# Patient Record
Sex: Female | Born: 1935 | Race: White | Hispanic: No | Smoking: Never smoker
Health system: Southern US, Community
[De-identification: ages and names within clinical notes are randomized; demographics above are authoritative.]

## PROBLEM LIST (undated history)

## (undated) DIAGNOSIS — I10 Essential (primary) hypertension: Secondary | ICD-10-CM

## (undated) DIAGNOSIS — I341 Nonrheumatic mitral (valve) prolapse: Secondary | ICD-10-CM

## (undated) DIAGNOSIS — M1990 Unspecified osteoarthritis, unspecified site: Secondary | ICD-10-CM

## (undated) DIAGNOSIS — T8859XA Other complications of anesthesia, initial encounter: Secondary | ICD-10-CM

## (undated) DIAGNOSIS — M810 Age-related osteoporosis without current pathological fracture: Secondary | ICD-10-CM

## (undated) DIAGNOSIS — I509 Heart failure, unspecified: Secondary | ICD-10-CM

## (undated) DIAGNOSIS — H3530 Unspecified macular degeneration: Secondary | ICD-10-CM

## (undated) DIAGNOSIS — T4145XA Adverse effect of unspecified anesthetic, initial encounter: Secondary | ICD-10-CM

## (undated) DIAGNOSIS — I2510 Atherosclerotic heart disease of native coronary artery without angina pectoris: Secondary | ICD-10-CM

## (undated) DIAGNOSIS — E785 Hyperlipidemia, unspecified: Secondary | ICD-10-CM

## (undated) DIAGNOSIS — R002 Palpitations: Secondary | ICD-10-CM

## (undated) DIAGNOSIS — I4891 Unspecified atrial fibrillation: Secondary | ICD-10-CM

## (undated) DIAGNOSIS — E875 Hyperkalemia: Secondary | ICD-10-CM

## (undated) HISTORY — PX: CATARACT EXTRACTION: SUR2

## (undated) HISTORY — PX: OTHER SURGICAL HISTORY: SHX169

## (undated) HISTORY — DX: Atherosclerotic heart disease of native coronary artery without angina pectoris: I25.10

## (undated) HISTORY — DX: Hyperlipidemia, unspecified: E78.5

## (undated) HISTORY — PX: ABDOMINAL HYSTERECTOMY: SHX81

## (undated) HISTORY — DX: Unspecified macular degeneration: H35.30

## (undated) HISTORY — DX: Nonrheumatic mitral (valve) prolapse: I34.1

## (undated) HISTORY — DX: Age-related osteoporosis without current pathological fracture: M81.0

## (undated) HISTORY — DX: Unspecified osteoarthritis, unspecified site: M19.90

## (undated) HISTORY — DX: Essential (primary) hypertension: I10

## (undated) HISTORY — DX: Hyperkalemia: E87.5

## (undated) HISTORY — PX: COLONOSCOPY: SHX174

## (undated) HISTORY — DX: Palpitations: R00.2

## (undated) HISTORY — PX: BREAST EXCISIONAL BIOPSY: SUR124

## (undated) HISTORY — PX: CYSTECTOMY: SUR359

---

## 2001-07-08 ENCOUNTER — Ambulatory Visit (HOSPITAL_COMMUNITY): Admission: RE | Admit: 2001-07-08 | Discharge: 2001-07-08 | Payer: Self-pay | Admitting: Family Medicine

## 2001-07-08 ENCOUNTER — Encounter: Payer: Self-pay | Admitting: Family Medicine

## 2001-07-15 ENCOUNTER — Encounter: Payer: Self-pay | Admitting: Specialist

## 2001-07-15 ENCOUNTER — Encounter: Admission: RE | Admit: 2001-07-15 | Discharge: 2001-07-15 | Payer: Self-pay | Admitting: Specialist

## 2003-01-30 ENCOUNTER — Encounter (HOSPITAL_COMMUNITY): Admission: RE | Admit: 2003-01-30 | Discharge: 2003-03-01 | Payer: Self-pay | Admitting: *Deleted

## 2003-03-18 ENCOUNTER — Encounter: Payer: Self-pay | Admitting: Family Medicine

## 2003-03-18 ENCOUNTER — Encounter (HOSPITAL_COMMUNITY): Admission: RE | Admit: 2003-03-18 | Discharge: 2003-04-17 | Payer: Self-pay | Admitting: Family Medicine

## 2003-04-27 ENCOUNTER — Encounter: Admission: RE | Admit: 2003-04-27 | Discharge: 2003-04-27 | Payer: Self-pay | Admitting: Orthopedic Surgery

## 2003-04-27 ENCOUNTER — Encounter: Payer: Self-pay | Admitting: Orthopedic Surgery

## 2003-05-21 ENCOUNTER — Encounter: Payer: Self-pay | Admitting: Physical Medicine and Rehabilitation

## 2003-05-21 ENCOUNTER — Ambulatory Visit (HOSPITAL_COMMUNITY)
Admission: RE | Admit: 2003-05-21 | Discharge: 2003-05-21 | Payer: Self-pay | Admitting: Physical Medicine and Rehabilitation

## 2003-06-09 ENCOUNTER — Encounter (HOSPITAL_COMMUNITY)
Admission: RE | Admit: 2003-06-09 | Discharge: 2003-07-09 | Payer: Self-pay | Admitting: Physical Medicine and Rehabilitation

## 2004-03-30 ENCOUNTER — Ambulatory Visit (HOSPITAL_COMMUNITY): Admission: RE | Admit: 2004-03-30 | Discharge: 2004-03-30 | Payer: Self-pay | Admitting: Internal Medicine

## 2004-10-13 ENCOUNTER — Ambulatory Visit (HOSPITAL_COMMUNITY): Admission: RE | Admit: 2004-10-13 | Discharge: 2004-10-13 | Payer: Self-pay | Admitting: Family Medicine

## 2005-02-07 ENCOUNTER — Encounter: Admission: RE | Admit: 2005-02-07 | Discharge: 2005-02-07 | Payer: Self-pay | Admitting: Family Medicine

## 2006-03-15 ENCOUNTER — Ambulatory Visit (HOSPITAL_COMMUNITY): Admission: RE | Admit: 2006-03-15 | Discharge: 2006-03-15 | Payer: Self-pay | Admitting: Family Medicine

## 2006-08-29 ENCOUNTER — Ambulatory Visit (HOSPITAL_COMMUNITY): Admission: RE | Admit: 2006-08-29 | Discharge: 2006-08-29 | Payer: Self-pay | Admitting: Family Medicine

## 2007-02-22 ENCOUNTER — Ambulatory Visit (HOSPITAL_COMMUNITY): Admission: RE | Admit: 2007-02-22 | Discharge: 2007-02-22 | Payer: Self-pay | Admitting: Family Medicine

## 2008-05-21 ENCOUNTER — Encounter: Admission: RE | Admit: 2008-05-21 | Discharge: 2008-05-21 | Payer: Self-pay | Admitting: Family Medicine

## 2008-11-03 ENCOUNTER — Encounter: Payer: Self-pay | Admitting: Cardiology

## 2009-04-20 ENCOUNTER — Encounter: Payer: Self-pay | Admitting: Cardiology

## 2009-04-21 ENCOUNTER — Encounter: Payer: Self-pay | Admitting: Cardiology

## 2009-04-21 ENCOUNTER — Encounter: Admission: RE | Admit: 2009-04-21 | Discharge: 2009-04-21 | Payer: Self-pay | Admitting: Family Medicine

## 2009-04-22 ENCOUNTER — Encounter: Payer: Self-pay | Admitting: Cardiology

## 2009-04-22 ENCOUNTER — Ambulatory Visit (HOSPITAL_COMMUNITY): Admission: RE | Admit: 2009-04-22 | Discharge: 2009-04-22 | Payer: Self-pay | Admitting: Family Medicine

## 2009-04-27 ENCOUNTER — Encounter: Payer: Self-pay | Admitting: Cardiology

## 2009-04-27 ENCOUNTER — Encounter (INDEPENDENT_AMBULATORY_CARE_PROVIDER_SITE_OTHER): Payer: Self-pay | Admitting: *Deleted

## 2009-04-27 ENCOUNTER — Ambulatory Visit: Payer: Self-pay | Admitting: Cardiology

## 2009-04-27 DIAGNOSIS — R072 Precordial pain: Secondary | ICD-10-CM | POA: Insufficient documentation

## 2009-04-27 DIAGNOSIS — E875 Hyperkalemia: Secondary | ICD-10-CM | POA: Insufficient documentation

## 2009-04-27 DIAGNOSIS — E785 Hyperlipidemia, unspecified: Secondary | ICD-10-CM | POA: Insufficient documentation

## 2009-04-27 DIAGNOSIS — I10 Essential (primary) hypertension: Secondary | ICD-10-CM | POA: Insufficient documentation

## 2009-04-28 ENCOUNTER — Telehealth: Payer: Self-pay | Admitting: Cardiology

## 2009-04-28 ENCOUNTER — Ambulatory Visit (HOSPITAL_COMMUNITY): Admission: RE | Admit: 2009-04-28 | Discharge: 2009-04-28 | Payer: Self-pay | Admitting: Cardiology

## 2009-04-28 ENCOUNTER — Ambulatory Visit: Payer: Self-pay | Admitting: Cardiology

## 2009-05-01 ENCOUNTER — Encounter: Payer: Self-pay | Admitting: Cardiology

## 2009-05-18 ENCOUNTER — Encounter: Payer: Self-pay | Admitting: Cardiology

## 2009-05-29 DIAGNOSIS — Z8679 Personal history of other diseases of the circulatory system: Secondary | ICD-10-CM | POA: Insufficient documentation

## 2009-06-04 ENCOUNTER — Encounter: Admission: RE | Admit: 2009-06-04 | Discharge: 2009-06-04 | Payer: Self-pay | Admitting: Family Medicine

## 2009-06-29 ENCOUNTER — Ambulatory Visit: Payer: Self-pay | Admitting: Cardiology

## 2010-04-28 ENCOUNTER — Ambulatory Visit (HOSPITAL_COMMUNITY): Admission: RE | Admit: 2010-04-28 | Discharge: 2010-04-28 | Payer: Self-pay | Admitting: Family Medicine

## 2010-06-10 ENCOUNTER — Encounter: Admission: RE | Admit: 2010-06-10 | Discharge: 2010-06-10 | Payer: Self-pay | Admitting: Family Medicine

## 2010-10-02 ENCOUNTER — Encounter: Payer: Self-pay | Admitting: Family Medicine

## 2010-10-03 ENCOUNTER — Encounter: Payer: Self-pay | Admitting: Family Medicine

## 2010-10-09 LAB — CONVERTED CEMR LAB
BUN: 16 mg/dL (ref 6–23)
Chloride: 102 meq/L (ref 96–112)
Creatinine, Ser: 1 mg/dL (ref 0.4–1.2)
Potassium: 4.1 meq/L (ref 3.5–5.1)

## 2010-12-13 ENCOUNTER — Encounter: Payer: Self-pay | Admitting: Cardiology

## 2010-12-16 ENCOUNTER — Other Ambulatory Visit (HOSPITAL_COMMUNITY): Payer: Self-pay | Admitting: Family Medicine

## 2010-12-16 ENCOUNTER — Ambulatory Visit (HOSPITAL_COMMUNITY)
Admission: RE | Admit: 2010-12-16 | Discharge: 2010-12-16 | Disposition: A | Discharge status: Home / Self Care | Payer: Medicare Other | Source: Ambulatory Visit | Attending: Family Medicine | Admitting: Family Medicine

## 2010-12-16 DIAGNOSIS — M542 Cervicalgia: Secondary | ICD-10-CM

## 2010-12-16 DIAGNOSIS — M79609 Pain in unspecified limb: Secondary | ICD-10-CM | POA: Insufficient documentation

## 2010-12-17 LAB — BASIC METABOLIC PANEL
BUN: 11 mg/dL (ref 6–23)
Calcium: 9.4 mg/dL (ref 8.4–10.5)
Creatinine, Ser: 0.75 mg/dL (ref 0.4–1.2)
GFR calc Af Amer: >60 mL/min (ref >60)
Glucose, Bld: 116 mg/dL — ABNORMAL HIGH (ref 70–99)

## 2010-12-17 LAB — CBC
HCT: 38.2 % (ref 36.0–46.0)
MCV: 91.7 fL (ref 78.0–100.0)
RDW: 13.7 % (ref 11.5–15.5)
WBC: 5.3 10*3/uL (ref 4.0–10.5)

## 2010-12-17 LAB — PROTIME-INR: Prothrombin Time: 12.7 seconds (ref 11.6–15.2)

## 2010-12-20 ENCOUNTER — Encounter: Payer: Self-pay | Admitting: Cardiology

## 2010-12-22 ENCOUNTER — Encounter: Payer: Self-pay | Admitting: Cardiology

## 2010-12-28 ENCOUNTER — Encounter: Payer: Self-pay | Admitting: Cardiology

## 2010-12-28 ENCOUNTER — Ambulatory Visit (INDEPENDENT_AMBULATORY_CARE_PROVIDER_SITE_OTHER): Payer: Medicare Other | Admitting: Cardiology

## 2010-12-28 DIAGNOSIS — E785 Hyperlipidemia, unspecified: Secondary | ICD-10-CM

## 2010-12-28 DIAGNOSIS — M79603 Pain in arm, unspecified: Secondary | ICD-10-CM

## 2010-12-28 DIAGNOSIS — R079 Chest pain, unspecified: Secondary | ICD-10-CM

## 2010-12-28 DIAGNOSIS — M79609 Pain in unspecified limb: Secondary | ICD-10-CM

## 2010-12-28 DIAGNOSIS — I10 Essential (primary) hypertension: Secondary | ICD-10-CM

## 2010-12-28 NOTE — Assessment & Plan Note (Signed)
Pain in arms is exertional, but also positional.  Of note, it can occur with some alteration of neck motion.  Findings on cervical spine reflect severe DJD.  Also reasonably good cath two years ago by Dr. Juanda Chance.  Hence, likelihood of CAD as cause is fairly low, but need for caution.  Recommend adenosine myoview for assessment.  Would also consider referral to neurosurgery if the above is ok.  Discussed at length with patient and family.  They are in agreement, and also agree that cath is not best at this time.

## 2010-12-28 NOTE — Assessment & Plan Note (Signed)
Reasonable control managed by Dr. Gerda Diss.

## 2010-12-28 NOTE — Progress Notes (Signed)
HPI:  Julie Swanson is seen at the request of Dr. Lilyan Punt.  She gets pain in the arms when she walks.  She underwent cath by Dr. Juanda Chance a couple of years ago which did not demonstrate significant obstruction.  Of note, she also has significant cervical spine disease recently documented by xrays.  All of these reports are populated in [facility].  See it for details. Position, like standing over the sink in kitchen also precipitates findings as does walking.  Denies any chest pain, and all is arm discomfort.    Current Outpatient Prescriptions  Medication Sig Dispense Refill  . atorvastatin (LIPITOR) 10 MG tablet Take 10 mg by mouth daily.        . calcium-vitamin D (OSCAL WITH D) 500-200 MG-UNIT per tablet Take 1 tablet by mouth 2 (two) times daily.        . fish oil-omega-3 fatty acids 1000 MG capsule Take 2 g by mouth daily.        . hydrochlorothiazide 25 MG tablet Take 25 mg by mouth daily.        . Lutein 20 MG CAPS Take by mouth.        . nabumetone (RELAFEN) 500 MG tablet Take 1,000 mg by mouth daily.        Marland Kitchen omeprazole (PRILOSEC) 20 MG capsule Take 20 mg by mouth daily.        . propranolol (INDERAL) 10 MG tablet Take 10 mg by mouth 2 (two) times daily.        Marland Kitchen DISCONTD: Lutein 20 MG CAPS Take by mouth daily.       Marland Kitchen DISCONTD: Multiple Vitamins-Minerals (ICAPS) CAPS Take by mouth daily.          Allergies  Allergen Reactions  . Piroxicam   . Raloxifene     Past Medical History  Diagnosis Date  . CAD (coronary artery disease)   . MVP (mitral valve prolapse)   . Palpitations   . HTN (hypertension)   . HLD (hyperlipidemia)   . Hyperkalemia   . Chest pain     Past Surgical History  Procedure Date  . Hysterectomy - unknown type     Family History  Problem Relation Age of Onset  . Stroke    . Heart attack      History   Social History  . Marital Status: Widowed    Spouse Name: N/A    Number of Children: N/A  . Years of Education: N/A   Occupational History  . Not on  file.   Social History Main Topics  . Smoking status: Never Smoker   . Smokeless tobacco: Not on file  . Alcohol Use: No  . Drug Use: No  . Sexually Active: Not on file   Other Topics Concern  . Not on file   Social History Narrative  . No narrative on file    ROS: Please see the HPI.  All other systems reviewed and negative.  PHYSICAL EXAM:  BP 148/84  Pulse 68  Ht 5\' 4"  (1.626 m)  Wt 148 lb (67.132 kg)  BMI 25.40 kg/m2  General: Well developed, well nourished, in no acute distress. Head:  Normocephalic and atraumatic. Neck: no JVD Lungs: Clear to auscultation and percussion. Heart: Normal S1 and S2.  No murmur, rubs or gallops.  Abdomen:  Normal bowel sounds; soft; non tender; no organomegaly Pulses: Pulses normal in all 4 extremities. Extremities: No clubbing or cyanosis. No edema. Neurologic: Alert and oriented x 3.  EKG:  NSR.  T inversion in V4 and V5, with flattened in V6.  Non specific tracing.  Not changed from last tracing in [facility].  ASSESSMENT AND PLAN:

## 2010-12-28 NOTE — Assessment & Plan Note (Signed)
Last LDL about 100 but given findings and age probably acceptable.

## 2010-12-28 NOTE — Patient Instructions (Signed)
Your physician has requested that you have an adenosine myoview. For further information please visit [email or web address] Please follow instruction sheet, as given.  Your physician recommends that you schedule a follow-up appointment in: we will discuss results stress test at your daughter's appointment

## 2011-01-05 ENCOUNTER — Ambulatory Visit (HOSPITAL_COMMUNITY): Payer: Medicare Other | Attending: Cardiology | Admitting: Radiology

## 2011-01-05 VITALS — Ht 64.0 in | Wt 152.0 lb

## 2011-01-05 DIAGNOSIS — I059 Rheumatic mitral valve disease, unspecified: Secondary | ICD-10-CM | POA: Insufficient documentation

## 2011-01-05 DIAGNOSIS — R0602 Shortness of breath: Secondary | ICD-10-CM | POA: Insufficient documentation

## 2011-01-05 DIAGNOSIS — R0989 Other specified symptoms and signs involving the circulatory and respiratory systems: Secondary | ICD-10-CM

## 2011-01-05 MED ORDER — TECHNETIUM TC 99M TETROFOSMIN IV KIT
10.9000 | PACK | Freq: Once | INTRAVENOUS | Status: AC | PRN
  Administered 2011-01-05: 10.9 via INTRAVENOUS

## 2011-01-05 MED ORDER — REGADENOSON 0.4 MG/5ML IV SOLN
0.4000 mg | Freq: Once | INTRAVENOUS | Status: AC
  Administered 2011-01-05: 0.4 mg via INTRAVENOUS

## 2011-01-05 MED ORDER — TECHNETIUM TC 99M TETROFOSMIN IV KIT
33.0000 | PACK | Freq: Once | INTRAVENOUS | Status: AC | PRN
  Administered 2011-01-05: 33 via INTRAVENOUS

## 2011-01-05 NOTE — Progress Notes (Signed)
[facility] SITE 3 NUCLEAR MED [address] [place] [address] [phone]  Cardiology Nuclear Med Study  Julie Swanson is a 77 y.o. female [id number] [birth year]   Nuclear Med Background Indication for Stress Test:  Evaluation for Ischemia History:8/10  Heart Catheterization EF-70%. Minimal CAD, 2003 Myocardial Perfusion Study- Nml and MVP Cardiac Risk Factors: CVA, Family History - CAD, Hypertension and Lipids  Symptoms:  DOE, Palpitations, Rapid HR and Extertional Bilateral Arm Pain- last episode 2 days ago   Nuclear Pre-Procedure Caffeine/Decaff Intake:  None NPO After: 7:00pm   Lungs:  Clear IV 0.9% NS with Angio Cath:  20g  IV Site: R Antecubital  IV Started by:  Bonnita Levan, RN  Chest Size (in):  36 Cup Size: B  Height: 5\' 4"  (1.626 m)  Weight:  152 lb (68.947 kg)  BMI:  Body mass index is 26.09 kg/(m^2). Tech Comments:  Patient did not take Inderal    Nuclear Med Study 1 or 2 day study: 1 day  Stress Test Type:  Treadmill/Lexiscan  Reading MD: Kristeen Miss, MD  Order Authorizing Provider:  Dr. Shawnie Pons  Resting Radionuclide: Technetium 46m Tetrofosmin  Resting Radionuclide Dose: 10.9 mCi   Stress Radionuclide:  Technetium 79m Tetrofosmin  Stress Radionuclide Dose: 33.0 mCi           Stress Protocol Rest HR: 63 Stress HR: 111  Rest BP: 159/90 Stress BP: 168/75  Exercise Time (min): 2:00 METS: 1.6   Predicted Max HR: 139 bpm % Max HR: 79.86 bpm Rate Pressure Product: 26948   Dose of Adenosine (mg):  n/a Dose of Lexiscan: 0.4 mg  Dose of Atropine (mg): n/a Dose of Dobutamine: n/a mcg/kg/min (at max HR)  Stress Test Technologist: Bonnita Levan, RN  Nuclear Technologist:  Domenic Polite, CNMT     Rest Procedure:  Myocardial perfusion imaging was performed at rest 45 minutes following the intravenous administration of Technetium 39m Tetrofosmin. Rest ECG: NSR  Stress Procedure:  The patient received IV Lexiscan 0.4 mg over  15-seconds with concurrent low level exercise and then Technetium 86m Tetrofosmin was injected at 30-seconds while the patient continued walking one more minute. Patient c/o neck tightness with injection. There were non-specific ST-T changes with Lexiscan.  Quantitative spect images were obtained after a 45-minute delay. Stress ECG: No significant change from baseline ECG  QPS Raw Data Images:  Normal; no motion artifact; normal heart/lung ratio. Stress Images:  Normal homogeneous uptake in all areas of the myocardium. Rest Images:  Normal homogeneous uptake in all areas of the myocardium. Subtraction (SDS):  No evidence of ischemia. Transient Ischemic Dilatation (Normal <1.22):  1.18 Lung/Heart Ratio (Normal <0.45):  0.24  Quantitative Gated Spect Images QGS EDV:  43 ml QGS ESV:  8 ml QGS cine images:  NL LV Function; NL Wall Motion QGS EF: 81%  Impression Exercise Capacity:  Lexiscan with low level exercise. BP Response:  Normal blood pressure response. Clinical Symptoms:  No chest pain. ECG Impression:  No significant ST segment change suggestive of ischemia. Comparison with Prior Nuclear Study: No significant change from previous study  Overall Impression:  Normal stress nuclear study.  No evidence of ischemia.  Normal LV function.    Elyn Aquas.,MD

## 2011-01-12 ENCOUNTER — Ambulatory Visit: Payer: Self-pay | Admitting: Cardiology

## 2011-01-12 NOTE — Progress Notes (Signed)
ROUTED TO DR. Riley Kill.Mirna Mires

## 2011-01-12 NOTE — Progress Notes (Signed)
Study reviewed with the patient in the office.  No significant ischemia noted.  Continue to monitor.

## 2011-01-24 NOTE — Cardiovascular Report (Signed)
Julie Swanson, Julie Swanson                ACCOUNT NO.:  [id number]   MEDICAL RECORD NO.:  [id number]          PATIENT TYPE:  OIB   LOCATION:  2899                         FACILITY:  [facility]   PHYSICIAN:  Everardo Beals. Juanda Chance, MD, FACCDATE OF BIRTH:  1928-07-19   DATE OF PROCEDURE:  04/28/2009  DATE OF DISCHARGE:                            CARDIAC CATHETERIZATION   CLINICAL HISTORY:  Ms. Stitt is 77 year old and has a previous history  of minimally nonobstructive disease at catheterization in 1998 by Dr.  Riley Kill.  Recently, she was seen by Dr. Gerda Diss for evaluation of  dizziness and vertigo, and had an MRI which showed a small old stroke,  but no other major abnormalities.  She suffered and developed chest pain  with radiation to her neck and arms, and had 2 episodes which were  somewhat prolonged.  Dr. Gerda Diss saw her and her ECG showed some lateral  ST-T changes, and he referred to [place].  I saw her yesterday in  consultation.  We scheduled her for angiography today as an outpatient.   She does have significant risk factors for vascular disease with  hypertension, hyperlipidemia, and positive family history for coronary  artery disease.   PROCEDURE:  The procedure was performed by the right femoral artery  using arterial sheath and 5-French preformed coronary catheters.  A  front wall arterial puncture was performed, and Omnipaque contrast was  used.  Distal aortogram was performed to rule out renovascular causes  for hypertension.  The patient tolerated the procedure well and left the  laboratory in satisfactory condition.   RESULTS:  Left main coronary artery:  The left main coronary artery was  free of significant disease.   Left anterior descending artery:  Left anterior descending artery was  irregular and gave rise to a large diagonal branch and 2 septal  perforators.  The LAD became small in caliber near the apex and  terminated before the apex, but there was no significant  obstruction.   Circumflex artery:  Circumflex artery gave rise to 2 early marginal  branches and a large posterolateral branch.  These vessels were free of  significant disease.   Right coronary artery:  Right coronary artery was a large vessel gave  rise to conus branch, right ventricular branch, posterior descending  branch, and a posterolateral branch.  There was some irregularities in  the right coronary artery, but no significant obstruction.   Left ventriculogram:  The left ventriculogram performed in the RAO  projection showed vigorous wall motion with no areas of hypokinesis.  Estimated ejection fraction was 70%.   Distal aortogram:  A distal aortogram showed patent renal arteries.  The  right renal artery bifurcated in 2 branches and a superior branch had a  fibromuscular dysplasia, but this did not appear to be obstructive.  There were irregularities in the distal aorta, but no significant  aortoiliac obstruction.   CONCLUSION:  Minimal nonobstructive coronary artery disease with  irregularities in the left anterior descending and right coronary artery  and no significant obstruction of circumflex artery, and normal left  ventricular function.   RECOMMENDATIONS:  The patient has only minimal nonobstructive coronary  artery disease, so I think her recent symptoms were not related to her  heart.  She has known cervical spine disease and it is possible symptoms  could be related to this.  We will plan reassurance and plan to arrange  a follow up with Dr. Gerda Diss.      Bruce Elvera Lennox Juanda Chance, MD, [facility]  Electronically Signed     BRB/MEDQ  D:  04/28/2009  T:  04/28/2009  Job:  [id number]   cc:   Lorin Picket A. Gerda Diss, MD

## 2011-01-27 NOTE — Op Note (Signed)
NAME:  Julie Swanson, Julie Swanson                          ACCOUNT NO.:  [id number]   MEDICAL RECORD NO.:  [id number]                   PATIENT TYPE:  AMB   LOCATION:  DAY                                  FACILITY:  [facility]   PHYSICIAN:  Lionel December, M.D.                 DATE OF BIRTH:  1927/11/02   DATE OF PROCEDURE:  03/30/2004  DATE OF DISCHARGE:                                 OPERATIVE REPORT   PROCEDURE:  Total colonoscopy.   INDICATIONS FOR PROCEDURE:  Julie Swanson is a 77 year old Caucasian female who is  undergoing colonoscopy primarily for screening purposes.  She states that  she has never had normal stools since her last colonoscopy about seven or  eight years ago.  She passes hard fecal balls.  She is on Colace.  She  denies melena or rectal bleeding.  Family history is positive for colon  carcinoma in her brother in his 37s.  The procedure and risks were reviewed  with the patient, and informed consent was obtained.   PREOPERATIVE MEDICATIONS:  Demerol 25 mg IV, Versed 3 mg IV.   FINDINGS:  The procedure was performed in the endoscopy suite.  The  patient's vital signs and O2 saturations were monitored during the procedure  and remained stable.  The patient was placed in the left lateral recumbent  position and rectal examination performed.  She had soft sentinel skin tags.  Digital exam was normal.  The Olympus videoscope was placed into the rectum  and advanced into the region of the sigmoid colon and beyond.  The  preparation was excellent.  Three small diverticula were noted, two at the  sigmoid colon and one at the transverse colon.  The scope was advanced to  the cecum which was identified by the appendiceal orifice and ileocecal  valve.  Pictures were taken for the record.  As the scope was withdrawn, the  colonic mucosa was carefully examined and was normal throughout.  The rectal  mucosa similarly was normal.  The scope was retroflexed to examine the  anorectal junction, and she  had hemorrhoids above and below the dentate  line.  The endoscope was straightened and withdrawn.  The patient tolerated  the procedure well.   FINAL DIAGNOSIS:  Three small diverticula and internal/external hemorrhoids.  Otherwise normal colonoscopy.   RECOMMENDATIONS:  1. She will continue high fiber diet and Colace as before.  2. Citrucel one tablespoon full daily.  If this combination does not help     with bowel movements, she can take Lactulose one to two tablespoons full     q.h.s.  Prescription give for one quart with refills for up to a year.  3. She may consider her next screening exam in five years from now.      ___________________________________________  Lionel December, M.D.   NR/MEDQ  D:  03/30/2004  T:  03/30/2004  Job:  [id number]   cc:   Lorin Picket A. Gerda Diss, M.D.  [address]  [place]  [place] [address]  Fax: [phone]

## 2011-02-16 ENCOUNTER — Encounter (INDEPENDENT_AMBULATORY_CARE_PROVIDER_SITE_OTHER): Payer: Self-pay | Admitting: Surgery

## 2011-05-08 ENCOUNTER — Other Ambulatory Visit: Payer: Self-pay | Admitting: Family Medicine

## 2011-05-08 DIAGNOSIS — Z1231 Encounter for screening mammogram for malignant neoplasm of breast: Secondary | ICD-10-CM

## 2011-06-12 ENCOUNTER — Ambulatory Visit
Admission: RE | Admit: 2011-06-12 | Discharge: 2011-06-12 | Disposition: A | Discharge status: Home / Self Care | Payer: Medicare Other | Source: Ambulatory Visit | Attending: Family Medicine | Admitting: Family Medicine

## 2011-06-12 DIAGNOSIS — Z1231 Encounter for screening mammogram for malignant neoplasm of breast: Secondary | ICD-10-CM

## 2011-07-13 ENCOUNTER — Ambulatory Visit (INDEPENDENT_AMBULATORY_CARE_PROVIDER_SITE_OTHER): Payer: Self-pay | Admitting: Surgery

## 2011-07-25 ENCOUNTER — Encounter: Payer: Self-pay | Admitting: Cardiology

## 2011-08-18 ENCOUNTER — Encounter (INDEPENDENT_AMBULATORY_CARE_PROVIDER_SITE_OTHER): Payer: Self-pay | Admitting: Surgery

## 2011-08-21 ENCOUNTER — Encounter (INDEPENDENT_AMBULATORY_CARE_PROVIDER_SITE_OTHER): Payer: Self-pay | Admitting: Surgery

## 2011-08-21 ENCOUNTER — Encounter (INDEPENDENT_AMBULATORY_CARE_PROVIDER_SITE_OTHER): Payer: Medicare Other | Admitting: Surgery

## 2011-09-22 ENCOUNTER — Encounter (INDEPENDENT_AMBULATORY_CARE_PROVIDER_SITE_OTHER): Payer: Self-pay | Admitting: Surgery

## 2011-09-27 ENCOUNTER — Encounter (INDEPENDENT_AMBULATORY_CARE_PROVIDER_SITE_OTHER): Payer: Medicare Other | Admitting: Surgery

## 2011-11-27 NOTE — Progress Notes (Signed)
This encounter was created in error - please disregard.

## 2012-05-23 ENCOUNTER — Other Ambulatory Visit: Payer: Self-pay | Admitting: Family Medicine

## 2012-05-23 DIAGNOSIS — Z1231 Encounter for screening mammogram for malignant neoplasm of breast: Secondary | ICD-10-CM

## 2012-06-12 ENCOUNTER — Ambulatory Visit: Payer: Medicare Other

## 2012-06-17 ENCOUNTER — Ambulatory Visit (INDEPENDENT_AMBULATORY_CARE_PROVIDER_SITE_OTHER): Payer: Medicare Other | Admitting: Cardiology

## 2012-06-17 ENCOUNTER — Encounter: Payer: Self-pay | Admitting: Cardiology

## 2012-06-17 VITALS — BP 150/92 | HR 79 | Resp 18 | Ht 64.0 in | Wt 153.0 lb

## 2012-06-17 DIAGNOSIS — R002 Palpitations: Secondary | ICD-10-CM

## 2012-06-17 DIAGNOSIS — I2510 Atherosclerotic heart disease of native coronary artery without angina pectoris: Secondary | ICD-10-CM

## 2012-06-17 DIAGNOSIS — R072 Precordial pain: Secondary | ICD-10-CM

## 2012-06-17 NOTE — Patient Instructions (Addendum)
Your physician has requested that you have an exercise tolerance test. For further information please visit [email or web address] Please also follow instruction sheet, as given. WITH DR Riley Kill IN 2 WEEKS  Your physician has recommended that you wear an event monitor. Event monitors are medical devices that record the heart's electrical activity. Doctors most often [place] these monitors to diagnose arrhythmias. Arrhythmias are problems with the speed or rhythm of the heartbeat. The monitor is a small, portable device. You can wear one while you do your normal daily activities. This is usually used to diagnose what is causing palpitations/syncope (passing out).   Your physician recommends that you HAVE LAB WORK TODAY: TSH

## 2012-06-18 ENCOUNTER — Encounter (INDEPENDENT_AMBULATORY_CARE_PROVIDER_SITE_OTHER): Payer: Medicare Other

## 2012-06-18 DIAGNOSIS — R002 Palpitations: Secondary | ICD-10-CM

## 2012-06-23 NOTE — Progress Notes (Signed)
HPI:  The patient returns in followup. She recently has had several tick bites. She also had no energy. She had a rash, and was seen by her primary care physician and was started on some Valtrex.  She then developed a very fast heart rate, and this lasted for just over an hour. She had a little bit of discomfort, it radiated into both elbows.  She is here for followup. Noted below is her last major cardiac evaluation which time she underwent cardiac catheterization by Dr. Juanda Chance in 2010:   CONCLUSION: Minimal nonobstructive coronary artery disease with  irregularities in the left anterior descending and right coronary artery  and no significant obstruction of circumflex artery, and normal left  ventricular function.  RECOMMENDATIONS: The patient has only minimal nonobstructive coronary  artery disease, so I think her recent symptoms were not related to her  heart. She has known cervical spine disease and it is possible symptoms  could be related to this. We will plan reassurance and plan to arrange  a follow up with Dr. Gerda Diss.  Bruce Elvera Lennox Juanda Chance, MD, [facility]  Electronically Signed  BRB/MEDQ D: 04/28/2009 T: 04/28/2009 Job: [id number]  Current Outpatient Prescriptions  Medication Sig Dispense Refill  . atorvastatin (LIPITOR) 10 MG tablet Take 10 mg by mouth daily.        . calcium-vitamin D (OSCAL WITH D) 500-200 MG-UNIT per tablet Take 1 tablet by mouth 2 (two) times daily.        . fish oil-omega-3 fatty acids 1000 MG capsule Take 2 g by mouth daily.        . hydrochlorothiazide 25 MG tablet Take 25 mg by mouth daily.        . Lutein 20 MG CAPS Take by mouth.        . Multiple Vitamins-Minerals (ICAPS) CAPS Take 2 capsules by mouth daily.      . nabumetone (RELAFEN) 500 MG tablet Take 1,000 mg by mouth daily.        . propranolol (INDERAL) 10 MG tablet Take 10 mg by mouth 2 (two) times daily.        . RESTASIS 0.05 % ophthalmic emulsion       . omeprazole (PRILOSEC) 20 MG capsule Take 20 mg  by mouth daily.          Allergies  Allergen Reactions  . Valtrex (Valacyclovir Hcl) Palpitations    Chest pain.  Marland Kitchen Antihistamines, Chlorpheniramine-Type   . Evista (Raloxifene)   . Feldene (Piroxicam)   . Toprol Xl (Metoprolol Tartrate)     Bad dreams  . Tropicamide     Dizziness, lethargic, non-responsive    Past Medical History  Diagnosis Date  . CAD (coronary artery disease)   . MVP (mitral valve prolapse)   . Palpitations   . HTN (hypertension)   . HLD (hyperlipidemia)   . Hyperkalemia   . Chest pain     Past Surgical History  Procedure Date  . Hysterectomy - unknown type   . Abdominal hysterectomy   . Cystectomy     BREAST  . Cataract extraction     BOTH EYES    Family History  Problem Relation Age of Onset  . Stroke    . Heart attack    . Heart disease Mother   . Stroke Mother   . Heart disease Father   . Cancer Father   . Cancer Brother   . Heart disease Brother     History   Social History  .  Marital Status: Widowed    Spouse Name: N/A    Number of Children: N/A  . Years of Education: N/A   Occupational History  . Not on file.   Social History Main Topics  . Smoking status: Never Smoker   . Smokeless tobacco: Never Used  . Alcohol Use: No  . Drug Use: No  . Sexually Active: Not on file   Other Topics Concern  . Not on file   Social History Narrative  . No narrative on file    ROS: Please see the HPI.  All other systems reviewed and negative.  PHYSICAL EXAM:  BP 150/92  Pulse 79  Resp 18  Ht 5\' 4"  (1.626 m)  Wt 153 lb (69.4 kg)  BMI 26.26 kg/m2  SpO2 92%  General: Well developed, well nourished, in no acute distress. Head:  Normocephalic and atraumatic. Neck: no JVD Lungs: Clear to auscultation and percussion. Heart: Normal S1 and S2.  No murmur, rubs or gallops.  Pulses: Pulses normal in all 4 extremities. Extremities: No clubbing or cyanosis. No edema. Neurologic: Alert and oriented x 3.  EKG:  NSR.  Possible  LAE.  IRBBB.  Nonspecific T abnormality with T inversion V3-V6.  No real change from 2012.  ASSESSMENT AND PLAN:  Duration of office visit more than 25 minutes.

## 2012-07-02 ENCOUNTER — Ambulatory Visit: Payer: Medicare Other

## 2012-07-02 ENCOUNTER — Ambulatory Visit (INDEPENDENT_AMBULATORY_CARE_PROVIDER_SITE_OTHER): Payer: Medicare Other | Admitting: Cardiology

## 2012-07-02 DIAGNOSIS — R002 Palpitations: Secondary | ICD-10-CM

## 2012-07-02 DIAGNOSIS — Z1231 Encounter for screening mammogram for malignant neoplasm of breast: Secondary | ICD-10-CM

## 2012-07-02 MED ORDER — PROPRANOLOL HCL 10 MG PO TABS: 10.0000 mg | ORAL_TABLET | Freq: Three times a day (TID) | ORAL | Status: DC

## 2012-07-02 NOTE — Procedures (Signed)
Exercise Treadmill Test  Pre-Exercise Testing Evaluation     Rate: 64   QRS:  .16  QTc: .42       Test  Exercise Tolerance Test Ordering MD: Shawnie Pons, MD  Interpreting MD: Shawnie Pons, MD  Unique Test No: 1  Treadmill:  1  Indication for ETT: Palp  Contraindication to ETT: No   Stress Modality: exercise - treadmill  Cardiac Imaging Performed: non   Protocol: standard Bruce - maximal  Max BP:  211/73  Max MPHR (bpm):  138 85% MPR (bpm):  117  MPHR obtained (bpm):  134 % MPHR obtained:  97  Reached 85% MPHR (min:sec):  2:00 Total Exercise Time (min-sec):  2:53  Workload in METS:  4.8 mets Borg Scale: 13  Reason ETT Terminated:  fatigue    ST Segment Analysis At Rest: normal ST segments - no evidence of significant ST depression With Exercise: non-specific ST changes  Other Information Arrhythmia:  No Angina during ETT:  absent (0) Quality of ETT:  diagnostic  ETT Interpretation:  borderline (indeterminate) with non-specific ST changes  Comments: The patient exercised and had limited exercise tolerance.  She did not have chest pain.  She walked about what she normally does at home.  No AP.  No diagnostic ECG changes--minimal ST slurring at a diagnostic heart rate.  The study was limited due to limited exercise tolerance, but without evidence of high risk ischemia  Recommendations: Continue to monitor for arrhythmias.  Would not recommend cardiac cath at this time.

## 2012-07-02 NOTE — Patient Instructions (Signed)
Your physician recommends that you schedule a follow-up appointment as needed.  Your physician has requested that you regularly monitor and record your blood pressure and pulse readings at home. Please use the same machine at the same time of day to check your readings and record them to bring to your follow-up visit.  Your physician has recommended you make the following change in your medication: INCREASE Inderal to 10mg  three times a day

## 2012-07-06 DIAGNOSIS — R002 Palpitations: Secondary | ICD-10-CM | POA: Insufficient documentation

## 2012-07-06 NOTE — Assessment & Plan Note (Signed)
As noted, we will obtain an event monitor.

## 2012-07-06 NOTE — Assessment & Plan Note (Signed)
The patient had an episode of very fast heart rate. She also had some chest pain. We will go ahead and get an event monitor at the present time, and also schedule her for an exercise tolerance test. I would not be in favor of nuclear imaging or catheterization at this point in time whether she had an episode of atrial fibrillation is unclear but she has not had further problems and we will try to assess this with the event monitor

## 2012-07-11 ENCOUNTER — Ambulatory Visit: Payer: Medicare Other

## 2012-07-17 ENCOUNTER — Ambulatory Visit
Admission: RE | Admit: 2012-07-17 | Discharge: 2012-07-17 | Disposition: A | Discharge status: Home / Self Care | Payer: Medicare Other | Source: Ambulatory Visit | Attending: Family Medicine | Admitting: Family Medicine

## 2012-11-18 ENCOUNTER — Other Ambulatory Visit: Payer: Self-pay | Admitting: Family Medicine

## 2012-11-18 DIAGNOSIS — M8580 Other specified disorders of bone density and structure, unspecified site: Secondary | ICD-10-CM

## 2012-11-18 DIAGNOSIS — M542 Cervicalgia: Secondary | ICD-10-CM

## 2012-11-18 DIAGNOSIS — M549 Dorsalgia, unspecified: Secondary | ICD-10-CM

## 2012-11-20 ENCOUNTER — Ambulatory Visit (HOSPITAL_COMMUNITY)
Admission: RE | Admit: 2012-11-20 | Discharge: 2012-11-20 | Disposition: A | Discharge status: Home / Self Care | Payer: Medicare Other | Source: Ambulatory Visit | Attending: Family Medicine | Admitting: Family Medicine

## 2012-11-20 DIAGNOSIS — M47812 Spondylosis without myelopathy or radiculopathy, cervical region: Secondary | ICD-10-CM | POA: Insufficient documentation

## 2012-11-20 DIAGNOSIS — M8580 Other specified disorders of bone density and structure, unspecified site: Secondary | ICD-10-CM

## 2012-11-20 DIAGNOSIS — M549 Dorsalgia, unspecified: Secondary | ICD-10-CM | POA: Insufficient documentation

## 2012-11-20 DIAGNOSIS — M542 Cervicalgia: Secondary | ICD-10-CM

## 2012-11-20 DIAGNOSIS — Z780 Asymptomatic menopausal state: Secondary | ICD-10-CM | POA: Insufficient documentation

## 2012-11-20 DIAGNOSIS — IMO0002 Reserved for concepts with insufficient information to code with codable children: Secondary | ICD-10-CM | POA: Insufficient documentation

## 2012-11-20 DIAGNOSIS — Z13820 Encounter for screening for osteoporosis: Secondary | ICD-10-CM | POA: Insufficient documentation

## 2012-11-20 DIAGNOSIS — M949 Disorder of cartilage, unspecified: Secondary | ICD-10-CM | POA: Insufficient documentation

## 2012-11-20 DIAGNOSIS — M899 Disorder of bone, unspecified: Secondary | ICD-10-CM | POA: Insufficient documentation

## 2012-12-06 ENCOUNTER — Telehealth: Payer: Self-pay | Admitting: Family Medicine

## 2012-12-06 NOTE — Telephone Encounter (Signed)
11:18Am Nurse: Patient: Julie Swanson  CB: [phone] RX Request: 1) Hydrochlonthize 25 mg 2) Nabumetone 500 mg 3)Proprarolol 10mg  4) Atorvastatin 10mg   MSG: Patient is using [facility] for the mail order rx. Per [facility] they will need the provider to either fax and/or e-prescribe (*) Patient needs to know if office can e-prescribe in the request.

## 2012-12-06 NOTE — Telephone Encounter (Signed)
Yes for all. 90 d with three refills. I'll do now

## 2012-12-06 NOTE — Telephone Encounter (Signed)
Rx's faxed to pharmacy

## 2013-03-03 ENCOUNTER — Telehealth: Payer: Self-pay | Admitting: *Deleted

## 2013-03-03 ENCOUNTER — Other Ambulatory Visit: Payer: Self-pay | Admitting: *Deleted

## 2013-03-03 ENCOUNTER — Telehealth: Payer: Self-pay | Admitting: Family Medicine

## 2013-03-03 DIAGNOSIS — Z79899 Other long term (current) drug therapy: Secondary | ICD-10-CM

## 2013-03-03 DIAGNOSIS — R5383 Other fatigue: Secondary | ICD-10-CM

## 2013-03-03 DIAGNOSIS — E785 Hyperlipidemia, unspecified: Secondary | ICD-10-CM

## 2013-03-03 NOTE — Telephone Encounter (Signed)
Lab papers per protocol, if questions review with me

## 2013-03-03 NOTE — Telephone Encounter (Signed)
Pt would like BW papers for her July 21 appt thanks

## 2013-03-03 NOTE — Telephone Encounter (Signed)
Left message to pt vm, of lab work papers ordered.

## 2013-03-18 ENCOUNTER — Other Ambulatory Visit: Payer: Self-pay | Admitting: *Deleted

## 2013-03-18 DIAGNOSIS — Z79899 Other long term (current) drug therapy: Secondary | ICD-10-CM

## 2013-03-18 DIAGNOSIS — E785 Hyperlipidemia, unspecified: Secondary | ICD-10-CM

## 2013-03-18 DIAGNOSIS — R5381 Other malaise: Secondary | ICD-10-CM

## 2013-03-18 LAB — LIPID PANEL
HDL: 38 mg/dL — ABNORMAL LOW (ref >39)
LDL Cholesterol: 90 mg/dL (ref 0–99)
Total CHOL/HDL Ratio: 4.2 Ratio
Triglycerides: 151 mg/dL — ABNORMAL HIGH (ref <150)
VLDL: 30 mg/dL (ref 0–40)

## 2013-03-18 LAB — HEMOGLOBIN A1C
Hgb A1c MFr Bld: 5.3 % (ref <5.7)
Mean Plasma Glucose: 105 mg/dL (ref <117)

## 2013-03-18 LAB — HEPATIC FUNCTION PANEL
ALT: 15 U/L (ref 0–35)
Bilirubin, Direct: 0.1 mg/dL (ref 0.0–0.3)
Indirect Bilirubin: 0.5 mg/dL (ref 0.0–0.9)
Total Bilirubin: 0.6 mg/dL (ref 0.3–1.2)

## 2013-03-31 ENCOUNTER — Encounter: Payer: Self-pay | Admitting: Family Medicine

## 2013-03-31 ENCOUNTER — Ambulatory Visit (INDEPENDENT_AMBULATORY_CARE_PROVIDER_SITE_OTHER): Payer: Medicare Other | Admitting: Family Medicine

## 2013-03-31 VITALS — BP 130/82 | Wt 153.6 lb

## 2013-03-31 DIAGNOSIS — E785 Hyperlipidemia, unspecified: Secondary | ICD-10-CM

## 2013-03-31 DIAGNOSIS — I10 Essential (primary) hypertension: Secondary | ICD-10-CM

## 2013-03-31 NOTE — Progress Notes (Signed)
  Subjective:    Patient ID: Julie Swanson, female    DOB: 1928/05/05, 79 y.o.   MRN: [id number]  Hyperlipidemia This is a chronic problem. The current episode started more than 1 year ago. Current antihyperlipidemic treatment includes statins.   This patient is done a really good job of watching her diet she stays active she takes her medicine. She denies any chest pressure tightness pain shortness breath nausea vomiting or rectal bleeding. PMH-macular degeneration, hypertension, hyperlipidemia family history noncontributory social does not smoke   Review of Systems See above    Objective:   Physical Exam  lungs are clear heart regular abdomen soft extremities no edemais regular pulse normal  skin warm dry       Assessment & Plan:  hyperlipidemia doing very well recheck again in approximately 6 months. Patient will followup in 3-4 months. Overall health doing well

## 2013-04-14 ENCOUNTER — Telehealth: Payer: Self-pay | Admitting: Family Medicine

## 2013-04-14 MED ORDER — NABUMETONE 500 MG PO TABS: 1000.0000 mg | ORAL_TABLET | Freq: Every day | ORAL | Status: DC

## 2013-04-14 NOTE — Telephone Encounter (Signed)
Med sent to mail order. Pt notified

## 2013-04-14 NOTE — Telephone Encounter (Signed)
Please call patient when complete °

## 2013-04-14 NOTE — Telephone Encounter (Addendum)
Patient needs Rx for nabumetone 500 mg 90 day supply to [facility] fax number [phone]

## 2013-06-23 ENCOUNTER — Other Ambulatory Visit: Payer: Self-pay

## 2013-06-23 DIAGNOSIS — Z1231 Encounter for screening mammogram for malignant neoplasm of breast: Secondary | ICD-10-CM

## 2013-06-27 ENCOUNTER — Telehealth: Payer: Self-pay | Admitting: Family Medicine

## 2013-06-27 DIAGNOSIS — M810 Age-related osteoporosis without current pathological fracture: Secondary | ICD-10-CM

## 2013-06-27 DIAGNOSIS — Z79899 Other long term (current) drug therapy: Secondary | ICD-10-CM

## 2013-06-27 DIAGNOSIS — E782 Mixed hyperlipidemia: Secondary | ICD-10-CM

## 2013-06-27 NOTE — Telephone Encounter (Signed)
OV on 10/29 send in BW for this please, call pt when ready

## 2013-06-27 NOTE — Telephone Encounter (Signed)
Bloodwork ordered. Patient was notified.  

## 2013-07-04 LAB — HEPATIC FUNCTION PANEL
AST: 19 U/L (ref 0–37)
Albumin: 4.1 g/dL (ref 3.5–5.2)
Alkaline Phosphatase: 76 U/L (ref 39–117)
Indirect Bilirubin: 0.5 mg/dL (ref 0.0–0.9)
Total Protein: 6.6 g/dL (ref 6.0–8.3)

## 2013-07-04 LAB — BASIC METABOLIC PANEL
CO2: 29 mEq/L (ref 19–32)
Chloride: 100 mEq/L (ref 96–112)
Glucose, Bld: 97 mg/dL (ref 70–99)
Potassium: 4.1 mEq/L (ref 3.5–5.3)
Sodium: 138 mEq/L (ref 135–145)

## 2013-07-04 LAB — LIPID PANEL: LDL Cholesterol: 90 mg/dL (ref 0–99)

## 2013-07-09 ENCOUNTER — Ambulatory Visit (INDEPENDENT_AMBULATORY_CARE_PROVIDER_SITE_OTHER): Payer: Medicare Other | Admitting: Family Medicine

## 2013-07-09 ENCOUNTER — Encounter: Payer: Self-pay | Admitting: Family Medicine

## 2013-07-09 VITALS — BP 118/78 | Ht 64.5 in | Wt 155.0 lb

## 2013-07-09 DIAGNOSIS — Z23 Encounter for immunization: Secondary | ICD-10-CM

## 2013-07-09 DIAGNOSIS — H3530 Unspecified macular degeneration: Secondary | ICD-10-CM | POA: Insufficient documentation

## 2013-07-09 DIAGNOSIS — M810 Age-related osteoporosis without current pathological fracture: Secondary | ICD-10-CM

## 2013-07-09 DIAGNOSIS — E785 Hyperlipidemia, unspecified: Secondary | ICD-10-CM

## 2013-07-09 DIAGNOSIS — I10 Essential (primary) hypertension: Secondary | ICD-10-CM

## 2013-07-09 DIAGNOSIS — R51 Headache: Secondary | ICD-10-CM

## 2013-07-09 DIAGNOSIS — M47812 Spondylosis without myelopathy or radiculopathy, cervical region: Secondary | ICD-10-CM

## 2013-07-09 NOTE — Progress Notes (Signed)
  Subjective:    Patient ID: Julie Swanson, female    DOB: [birth year], 79 y.o.   MRN: [id number]  Hypertension This is a chronic problem. The current episode started more than 1 year ago. The problem has been gradually improving since onset. The problem is controlled. There are no associated agents to hypertension. There are no known risk factors for coronary artery disease. Treatments tried: inderal. The current treatment provides significant improvement. There are no compliance problems.   Patient also wants results of her bloodwork.  Patient states she's been eating healthy. She also states she denies any chest tightness pressure pain shortness of breath she is able to do some walking but not as much as she used to. She relates that she's gotten older a little more fatigued. Having issues with the eye. Dr Carlynn Purl and Dr.Rankin She relates that she was told that she has a staph infection in her left eye. She denies having any fevers but she does state that she has some intermittent headaches that are in the left temporal above the left diarrhea region Review of Systems    see above Objective:   Physical Exam  Vitals reviewed. Constitutional: She appears well-developed and well-nourished. No distress.  HENT:  Head: Normocephalic.  Right Ear: External ear normal.  Left Ear: External ear normal.  Cardiovascular: Normal rate, regular rhythm and normal heart sounds.   No murmur heard. Pulmonary/Chest: Effort normal and breath sounds normal. She has no wheezes. She has no rales.  Abdominal: Soft.  Musculoskeletal: She exhibits no edema.  Lymphadenopathy:    She has no cervical adenopathy.  Skin: Skin is warm and dry.          Assessment & Plan:  Flu vac today Sed rate-rule out the possibility temporal arteritis. She is having some mild headaches now or in the left temporal and left frontal area. She sees her eye specialist on regular basis for macular degeneration but she also has some  mild issues on the left side of her specialists was seeing hopefully he'll get better Hypertension good control overall Osteoporosis borderline did not tolerate medications using calcium vitamin D currently Followup 6 months

## 2013-07-09 NOTE — Patient Instructions (Signed)
Loratadine 10mg  and fexofenadine 180 mg both are OTC

## 2013-07-18 ENCOUNTER — Ambulatory Visit: Payer: Medicare Other

## 2013-07-21 ENCOUNTER — Ambulatory Visit
Admission: RE | Admit: 2013-07-21 | Discharge: 2013-07-21 | Disposition: A | Discharge status: Home / Self Care | Payer: Medicare Other | Source: Ambulatory Visit

## 2013-07-21 DIAGNOSIS — Z1231 Encounter for screening mammogram for malignant neoplasm of breast: Secondary | ICD-10-CM

## 2013-07-28 ENCOUNTER — Encounter: Payer: Self-pay | Admitting: *Deleted

## 2013-07-28 ENCOUNTER — Ambulatory Visit (INDEPENDENT_AMBULATORY_CARE_PROVIDER_SITE_OTHER): Payer: Medicare Other | Admitting: Cardiology

## 2013-07-28 ENCOUNTER — Encounter: Payer: Self-pay | Admitting: Cardiology

## 2013-07-28 VITALS — BP 154/76 | HR 60 | Ht 64.5 in | Wt 154.8 lb

## 2013-07-28 DIAGNOSIS — I2510 Atherosclerotic heart disease of native coronary artery without angina pectoris: Secondary | ICD-10-CM

## 2013-07-28 DIAGNOSIS — R002 Palpitations: Secondary | ICD-10-CM

## 2013-07-28 DIAGNOSIS — I10 Essential (primary) hypertension: Secondary | ICD-10-CM

## 2013-07-28 DIAGNOSIS — E785 Hyperlipidemia, unspecified: Secondary | ICD-10-CM

## 2013-07-28 NOTE — Patient Instructions (Signed)
Your physician has requested that you have an echocardiogram. Echocardiography is a painless test that uses sound waves to create images of your heart. It provides your doctor with information about the size and shape of your heart and how well your heart's chambers and valves are working. This procedure takes approximately one hour. There are no restrictions for this procedure.  Your physician has recommended that you wear an event monitor. Event monitors are medical devices that record the heart's electrical activity. Doctors most often [place] these monitors to diagnose arrhythmias. Arrhythmias are problems with the speed or rhythm of the heartbeat. The monitor is a small, portable device. You can wear one while you do your normal daily activities. This is usually used to diagnose what is causing palpitations/syncope (passing out). 30 Day Monitor  Your physician recommends that you schedule a follow-up appointment in: 6 weeks with Dr Shirlee Latch.

## 2013-07-28 NOTE — Progress Notes (Signed)
Patient ID: Julie Swanson, female   DOB: 11-25-1927, 79 y.o.   MRN: [id number] PCP: Dr. Gerda Diss  79 yo with history of palpitations and prior cath showing minimal disease presents for cardiology followup.  She has been seen by Dr. Riley Kill in the past and is seen by me for the first time today.  Periodically, she has had episodes of rapid, irregular heart rate.  She will have an episode lasting an hour or two at a time every 4-5 weeks.  She wore an event monitor in 10/13 for these symptoms and had only PACs.  However, last Friday she woke up with her heart racing.  Her HR was in the 130s-140s on her blood pressure monitor.  This lasted all day and resolved around 8:30 pm.  She took multiple doses of propranolol without effect.  She had pain in her upper back between her shoulders radiating down her arms when while this was going on.  No lightheadedness or chest pain.  She has had no tachypalpitations since that time.  Her ECG today is sinus rhythm, no change from prior.  At baseline, she does well with no exertional chest pain or dyspnea.    ECG: NSR, Qs in V1 and V2, anterolateral T wave inversions  Labs (10/14): LDL 90, HDL 41, K 4.1, creatinine 0.9  PMH: 1. LHC (8/10) with minimal nonobstructive disease.  ETT (10/13) with 2'53" exercise, nonspecific ECG changes with no evidence for ischemia.  2. Mitral valve prolapse.  3. H/o palpitations: Event monitor (10/13) with PACs only.   4. HTN 5. Hyperlipidemia 6. Hysterectomy.  SH: Widow, lives in [place], nonsmoker.  2 daughters.   FH: Mother with CVA, CAD.  Father with CAD.   ROS: All systems reviewed and negative except as per HPI.   Current Outpatient Prescriptions  Medication Sig Dispense Refill  . atorvastatin (LIPITOR) 10 MG tablet Take 10 mg by mouth daily.        . calcium-vitamin D (OSCAL WITH D) 500-200 MG-UNIT per tablet Take 1 tablet by mouth daily with breakfast.       . Glycerin-Polysorbate 80 (REFRESH DRY EYE THERAPY OP) Apply  to eye.      . hydrochlorothiazide 25 MG tablet Take 25 mg by mouth daily.        . Lutein 20 MG CAPS Take by mouth.        . Multiple Vitamins-Minerals (ICAPS) CAPS Take 2 capsules by mouth daily.      . nabumetone (RELAFEN) 500 MG tablet Take 2 tablets (1,000 mg total) by mouth daily.  60 tablet  1  . propranolol (INDERAL) 10 MG tablet Take 1 tablet (10 mg total) by mouth 3 (three) times daily.  90 tablet  12   No current facility-administered medications for this visit.    BP 154/76  Pulse 60  Ht 5' 4.5" (1.638 m)  Wt 154 lb 12.8 oz (70.217 kg)  BMI 26.17 kg/m2 General: NAD Neck: No JVD, no thyromegaly or thyroid nodule.  Lungs: Clear to auscultation bilaterally with normal respiratory effort. CV: Nondisplaced PMI.  Heart regular S1/S2, no S3/S4, no murmur.  No peripheral edema.  No carotid bruit.  Normal pedal pulses.  Abdomen: Soft, nontender, no hepatosplenomegaly, no distention.  Skin: Intact without lesions or rashes.  Neurologic: Alert and oriented x 3.  Psych: Normal affect. Extremities: No clubbing or cyanosis.   Assessment/Plan: 1. Palpitations: Her symptoms on Friday were concerning for atrial fibrillation with RVR.  Given the high heart  rate, this is probably the most likely explanation.  She has had periodic runs of tachypalpitations but Friday's was the worst.  Event monitor in 10/13 documented only PACs.  If she does indeed have atrial fibrillation, she should be anticoagulated.  - For now, continue propranolol.  - I will have her wear a 30 day monitor again.  If no arrhythmia is detected, I would have her get an implanted loop recorder.   - Given possible atrial fibrillation, I will have her get an echocardiogram.  2. HTN:  BP is running a bit high.  However, will continue current regimen for now.  3. Hyperlipidemia: Good lipids in 10/14.   Marca Ancona 07/28/2013 4:49 PM

## 2013-08-06 ENCOUNTER — Ambulatory Visit (HOSPITAL_COMMUNITY): Payer: Medicare Other | Attending: Cardiology | Admitting: Cardiology

## 2013-08-06 ENCOUNTER — Encounter (INDEPENDENT_AMBULATORY_CARE_PROVIDER_SITE_OTHER): Payer: Medicare Other

## 2013-08-06 ENCOUNTER — Encounter: Payer: Self-pay | Admitting: *Deleted

## 2013-08-06 ENCOUNTER — Encounter: Payer: Self-pay | Admitting: Cardiology

## 2013-08-06 DIAGNOSIS — I059 Rheumatic mitral valve disease, unspecified: Secondary | ICD-10-CM | POA: Insufficient documentation

## 2013-08-06 DIAGNOSIS — R002 Palpitations: Secondary | ICD-10-CM

## 2013-08-06 DIAGNOSIS — I2510 Atherosclerotic heart disease of native coronary artery without angina pectoris: Secondary | ICD-10-CM

## 2013-08-06 DIAGNOSIS — E785 Hyperlipidemia, unspecified: Secondary | ICD-10-CM | POA: Insufficient documentation

## 2013-08-06 DIAGNOSIS — I10 Essential (primary) hypertension: Secondary | ICD-10-CM | POA: Insufficient documentation

## 2013-08-06 NOTE — Progress Notes (Signed)
Patient ID: Julie Swanson, female   DOB: Sep 16, 1927, 79 y.o.   MRN: [id number] [facility]  30 day cardiac event monitor applied to patient.

## 2013-08-06 NOTE — Progress Notes (Signed)
Echo performed. 

## 2013-08-08 ENCOUNTER — Telehealth: Payer: Self-pay | Admitting: *Deleted

## 2013-08-08 DIAGNOSIS — Z1231 Encounter for screening mammogram for malignant neoplasm of breast: Secondary | ICD-10-CM

## 2013-08-08 DIAGNOSIS — R002 Palpitations: Secondary | ICD-10-CM

## 2013-08-08 MED ORDER — APIXABAN 5 MG PO TABS: 5.0000 mg | ORAL_TABLET | Freq: Two times a day (BID) | ORAL | Status: DC

## 2013-08-08 MED ORDER — PROPRANOLOL HCL 10 MG PO TABS: 10.0000 mg | ORAL_TABLET | Freq: Four times a day (QID) | ORAL | Status: DC

## 2013-08-08 NOTE — Telephone Encounter (Signed)
Have them come in together.  That is fine.

## 2013-08-08 NOTE — Telephone Encounter (Signed)
Daughter informed

## 2013-08-08 NOTE — Telephone Encounter (Signed)
SPOKE  WITH  PT RE  LIFE  WATCH  RECORDING  SHOWS  A FIB  REVIEWED WITH  DR  BRACKBILL PT  TO INCREASE INDERAL TO 4  TIMES  A DAY  AND START APIXIBAN 5 MG  BID

## 2013-08-08 NOTE — Telephone Encounter (Signed)
Daughter requesting patient be seen on Monday 08/11/13 @8 :45 am in her appointment slot Vikki [place]s Paschal), to evaluate her racing heart. Nurse advised daughter that patient recently seen for this and event monitor ordered. Nurse advised daughter that MD could review available strips now and make recommendations. Daughter insisted on patient seeing Shirlee Latch and he making medication recommendations versus other cardiologist since he knew family h/o and cardiac problems. Nurse advised daughter that Dr. Shirlee Latch would be get message on Monday and she could call back on Monday morning for response.

## 2013-08-11 ENCOUNTER — Encounter: Payer: Self-pay | Admitting: Cardiology

## 2013-08-11 ENCOUNTER — Ambulatory Visit (INDEPENDENT_AMBULATORY_CARE_PROVIDER_SITE_OTHER): Payer: Medicare Other | Admitting: Cardiology

## 2013-08-11 VITALS — BP 140/90 | HR 64 | Ht 64.5 in | Wt 154.0 lb

## 2013-08-11 DIAGNOSIS — I4891 Unspecified atrial fibrillation: Secondary | ICD-10-CM | POA: Insufficient documentation

## 2013-08-11 DIAGNOSIS — I10 Essential (primary) hypertension: Secondary | ICD-10-CM

## 2013-08-11 DIAGNOSIS — E785 Hyperlipidemia, unspecified: Secondary | ICD-10-CM

## 2013-08-11 MED ORDER — DRONEDARONE HCL 400 MG PO TABS: 400.0000 mg | ORAL_TABLET | Freq: Two times a day (BID) | ORAL | Status: DC

## 2013-08-11 MED ORDER — APIXABAN 5 MG PO TABS: 5.0000 mg | ORAL_TABLET | Freq: Two times a day (BID) | ORAL | Status: DC

## 2013-08-11 MED ORDER — RIVAROXABAN 20 MG PO TABS: 20.0000 mg | ORAL_TABLET | Freq: Every day | ORAL | Status: DC

## 2013-08-11 MED ORDER — DILTIAZEM HCL ER 120 MG PO CP12: 120.0000 mg | ORAL_CAPSULE | Freq: Two times a day (BID) | ORAL | Status: DC

## 2013-08-11 NOTE — Progress Notes (Signed)
Patient ID: Julie Swanson, female   DOB: 06/17/1928, 79 y.o.   MRN: [id number] PCP: Dr. Lilyan Punt  79 yo with history of palpitations and prior cath showing minimal disease presents for cardiology followup.  At last appointment, she reported spells of rapid tachypalpitations lasting 10-20 minutes.  I had her wear an event monitor due to concern for atrial fibrillation. She feels "bad" when she gets the tachypalpitations.  On 11/26, she noted her heart beating rapidly.  This lasted all day. She was mildly lightheaded.  It resolved on 11/27. This corresponded to atrial fibrillation with RVR on event monitor (HR to 150s).  She has been getting these palpitations at least weekly for months.  The episode on 11/26-11/27 was the longest.  No chest pain.  She is otherwise stable with mild dyspnea if she walks up steps.  Echo in 11/14 showed EF 60-65% with mild MR.   ECG: NSR, inferolateral nonspecific T wave inversions  Labs (10/14): LDL 90, HDL 41, K 4.1, creatinine 0.9  PMH: 1. LHC (8/10) with minimal nonobstructive disease.  ETT (10/13) with 2'53" exercise, nonspecific ECG changes with no evidence for ischemia.  2. Mitral valve prolapse: Echo (11/14) with EF 60-65%, mild MR.  3. Atrial fibrillation:  Event monitor (10/13) with PACs only.  Event monitor (11/14) showed atrial fibrillation with RVR. She does not tolerate metoprolol (nightmares). 4. HTN 5. Hyperlipidemia 6. Hysterectomy.  SH: Widow, lives in [place], nonsmoker.  2 daughters.   FH: Mother with CVA, CAD.  Father with CAD.   ROS: All systems reviewed and negative except as per HPI.   Current Outpatient Prescriptions  Medication Sig Dispense Refill  . apixaban (ELIQUIS) 5 MG TABS tablet Take 1 tablet (5 mg total) by mouth 2 (two) times daily.  60 tablet  11  . atorvastatin (LIPITOR) 10 MG tablet Take 10 mg by mouth daily.        . calcium-vitamin D (OSCAL WITH D) 500-200 MG-UNIT per tablet Take 1 tablet by mouth daily with  breakfast.       . Glycerin-Polysorbate 80 (REFRESH DRY EYE THERAPY OP) Apply to eye.      . hydrochlorothiazide 25 MG tablet Take 25 mg by mouth daily.        . Lutein 20 MG CAPS Take by mouth.        . Multiple Vitamins-Minerals (ICAPS) CAPS Take 2 capsules by mouth daily.      Marland Kitchen diltiazem (CARDIZEM SR) 120 MG 12 hr capsule Take 1 capsule (120 mg total) by mouth 2 (two) times daily.  30 capsule  11  . dronedarone (MULTAQ) 400 MG tablet Take 1 tablet (400 mg total) by mouth 2 (two) times daily with a meal.  60 tablet  11   No current facility-administered medications for this visit.    BP 140/90  Pulse 64  Ht 5' 4.5" (1.638 m)  Wt 154 lb (69.854 kg)  BMI 26.04 kg/m2 General: NAD Neck: No JVD, no thyromegaly or thyroid nodule.  Lungs: Clear to auscultation bilaterally with normal respiratory effort. CV: Nondisplaced PMI.  Heart regular S1/S2, no S3/S4, no murmur.  No peripheral edema.  No carotid bruit.  Normal pedal pulses.  Abdomen: Soft, nontender, no hepatosplenomegaly, no distention.  Skin: Intact without lesions or rashes.  Neurologic: Alert and oriented x 3.  Psych: Normal affect. Extremities: No clubbing or cyanosis.   Assessment/Plan: 1. Atrial fibrillation:  Paroxysmal.  She has symptomatic RVR when she goes into atrial fibrillation.  EF normal on recent echo with mild MR.  - Stop Inderal, start diltiazem CD 120 mg daily (does not tolerate metoprolol).  - Start Eliquis 5 mg bid for anticoagulation.  - Given significant symptoms with atrial fibrillation and frequent episodes, will start her on Multaq 400 mg bid.  She will need ECG in 2 wks.  2. HTN:  BP upper normal, as above will be starting diltiazem.  3. Hyperlipidemia: Good lipids in 10/14.   Followup in 1 month.   Marca Ancona 08/11/2013 11:50 AM

## 2013-08-11 NOTE — Patient Instructions (Signed)
Stop Inderal.   Start Cardizem CD 120mg  daily.   Start EITHER :  Eliquis 5mg  two times a day  OR Xarelto 20mg  daily.  These are blood thinners.  Get the one that is least expensive for you. Stop Relafen. You will need to take something for pain that does not interact with the blood thinners, either Eliquis or Xarelto.   Start Multaq 400mg  two times a day WITH FOOD. This is for the atrial fibrillation.   It this is too expensive call and Dr Shirlee Latch can give you a different medication, flecainide. You will need to have a treadmill if you start this medication.  Your physician recommends that you schedule a follow-up appointment in: 2 weeks for an EKG two weeks after you start Multaq.  Your physician recommends that you schedule a follow-up appointment in: 1 month with Dr Shirlee Latch.  Your physician recommends that you schedule a follow-up appointment in: 1 month in CVRR since you are starting either Eliquis or Xarelto.

## 2013-08-20 ENCOUNTER — Telehealth: Payer: Self-pay | Admitting: Cardiology

## 2013-08-20 NOTE — Telephone Encounter (Signed)
New problem:  Pt's daughter, Darel Hong is calling in reference to a new medication, Diltiazer.  Since starting this med, pt has been experiencing a continuous "strange" headache day and night. She has been taking this medication since 08/16/13. She has been taking the med twice a day, am and pm. Pt's daughter is asking if the patient can go back to Inderal 3 per day. Which is what the pt was taking up until Saturday the 6th. Darel Hong is requesting a call back.

## 2013-08-21 MED ORDER — CARVEDILOL 6.25 MG PO TABS: 6.2500 mg | ORAL_TABLET | Freq: Two times a day (BID) | ORAL | Status: DC

## 2013-08-21 NOTE — Telephone Encounter (Signed)
Sometimes diltiazem can cause ankle edema.  If possible, would rather have her on something other than Inderal.  She apparently did not tolerate metoprolol in the past.  Could she try Coreg 6.25 mg bid?

## 2013-08-21 NOTE — Telephone Encounter (Signed)
Pt's daughter advised, verbalized understanding.

## 2013-08-21 NOTE — Telephone Encounter (Signed)
Spoke with patient's daughter. Pt changed over from Inderal to Diltiazem on 08/16/13. She started Multaq and Eliquis 08/12/13. She states her BP has been OK but she has headache since changing over to Diltiaem. She also has bilateral ankle edema starting yesterday that is improved but still there this AM. Pt denies increase in SOB. Pt feels overall she has evened out but would like to go back to Inderal instead of Diltiazem. She is also asking if she can go ahead and return monitor. I am trying to get monitor report for review. I will forward to Dr Shirlee Latch.

## 2013-08-21 NOTE — Telephone Encounter (Signed)
Dr Shirlee Latch reviewed monitor strips available and said pt could return monitor now, daughter advised.

## 2013-08-25 ENCOUNTER — Ambulatory Visit (INDEPENDENT_AMBULATORY_CARE_PROVIDER_SITE_OTHER): Payer: Medicare Other | Admitting: Nurse Practitioner

## 2013-08-25 ENCOUNTER — Telehealth: Payer: Self-pay | Admitting: Family Medicine

## 2013-08-25 VITALS — BP 130/72 | HR 63 | Resp 16

## 2013-08-25 DIAGNOSIS — I4891 Unspecified atrial fibrillation: Secondary | ICD-10-CM

## 2013-08-25 MED ORDER — TRAMADOL HCL 50 MG PO TABS: 50.0000 mg | ORAL_TABLET | Freq: Three times a day (TID) | ORAL | Status: DC | PRN

## 2013-08-25 NOTE — Telephone Encounter (Signed)
Tramadol 50 mg one 3 times a day when necessary, #60, 2 refills, followup office visit within every 3 months

## 2013-08-25 NOTE — Progress Notes (Signed)
Patient presents for ekg/nurse visit since starting Multaq 2 weeks ago.  Patient is alert & oriented to person, place, time; presents in no acute distress; skin is warm, dry, acyanotic; patient ambulates without difficulty.  Patient and daughter were taken to an exam room where vital signs and ekg were obtained.  Patient states she has been feeling bad since she stopped taking Inderal and started Diltiazem.  She states she started 3 new medicines at one time and seems to tolerate the Eliquis and Multaq without difficulty; states it was when she started the Diltiazem that she started feeling bad.  I reviewed patient's medications with her.  Patient states the swelling in her lower extremities is better today.  I assessed her right ankle and foot and there is no pitting edema, minor swelling noted; good pedal pulses and good color.  I advised patient to elevate lower extremities when she is sitting.  Patient also c/o neck pain since she stopped Relafen.  I took ekg to Dr. Shirlee Latch who is in the office today and reviewed patient's symptoms and concerns with him.  Katina Dung, RN advised patient's daughter on 12/11 to stop patient should stop Diltiazem and start Coreg 6.25 bid. He advised that ekg is normal, that patient continue with Coreg since this is a new medication for her, and that patient can call PCP for Rx for Tramadol to try for neck pain and to continue to supplement with Tylenol.  Patient verified that she did stop the Diltiazem last week and started Coreg.  Patient is in agreement to continue this medication for a little longer and will call with problems.  Patient verbalized understanding of all instructions and was discharged in no acute distress.

## 2013-08-25 NOTE — Telephone Encounter (Signed)
Pt takes ralefin(can't take due to the blood thinner she is on)  for some time for her neck pain, her heart doc took her off this med an as told her to try tramadol but to call our office to see if we will give her a script for this? She has been taking tylenol for the time being but its not helping. She states that if you think of something else she can take that is not as addictive she if fine with whatever you suggest. Please call into  [facility]

## 2013-08-25 NOTE — Telephone Encounter (Signed)
Patient notified and verbalized understanding. 

## 2013-09-09 ENCOUNTER — Other Ambulatory Visit: Payer: Self-pay | Admitting: *Deleted

## 2013-09-17 ENCOUNTER — Other Ambulatory Visit: Payer: Self-pay | Admitting: *Deleted

## 2013-09-18 ENCOUNTER — Encounter (INDEPENDENT_AMBULATORY_CARE_PROVIDER_SITE_OTHER): Payer: Self-pay

## 2013-09-18 ENCOUNTER — Other Ambulatory Visit: Payer: Medicare Other

## 2013-09-18 ENCOUNTER — Encounter: Payer: Self-pay | Admitting: *Deleted

## 2013-09-18 ENCOUNTER — Encounter: Payer: Self-pay | Admitting: Cardiology

## 2013-09-18 ENCOUNTER — Ambulatory Visit (INDEPENDENT_AMBULATORY_CARE_PROVIDER_SITE_OTHER): Payer: Medicare Other | Admitting: Pharmacist

## 2013-09-18 ENCOUNTER — Ambulatory Visit (INDEPENDENT_AMBULATORY_CARE_PROVIDER_SITE_OTHER): Payer: Medicare Other | Admitting: Cardiology

## 2013-09-18 ENCOUNTER — Ambulatory Visit: Payer: Medicare Other | Admitting: Pharmacist

## 2013-09-18 VITALS — BP 158/73 | HR 60 | Ht 64.0 in | Wt 152.8 lb

## 2013-09-18 DIAGNOSIS — Z79899 Other long term (current) drug therapy: Secondary | ICD-10-CM

## 2013-09-18 DIAGNOSIS — I4891 Unspecified atrial fibrillation: Secondary | ICD-10-CM

## 2013-09-18 DIAGNOSIS — E785 Hyperlipidemia, unspecified: Secondary | ICD-10-CM

## 2013-09-18 DIAGNOSIS — I10 Essential (primary) hypertension: Secondary | ICD-10-CM

## 2013-09-18 DIAGNOSIS — R0989 Other specified symptoms and signs involving the circulatory and respiratory systems: Secondary | ICD-10-CM

## 2013-09-18 LAB — BASIC METABOLIC PANEL
BUN: 15 mg/dL (ref 6–23)
CHLORIDE: 98 meq/L (ref 96–112)
CO2: 32 mEq/L (ref 19–32)
Calcium: 9.3 mg/dL (ref 8.4–10.5)
Creatinine, Ser: 1 mg/dL (ref 0.4–1.2)
GFR: 59.59 mL/min — ABNORMAL LOW (ref >60.00)
Glucose, Bld: 99 mg/dL (ref 70–99)
POTASSIUM: 4 meq/L (ref 3.5–5.1)
Sodium: 136 mEq/L (ref 135–145)

## 2013-09-18 LAB — CBC
HEMATOCRIT: 36.5 % (ref 36.0–46.0)
HEMOGLOBIN: 12.2 g/dL (ref 12.0–15.0)
MCHC: 33.4 g/dL (ref 30.0–36.0)
MCV: 90.6 fl (ref 78.0–100.0)
PLATELETS: 227 10*3/uL (ref 150.0–400.0)
RBC: 4.03 Mil/uL (ref 3.87–5.11)
RDW: 13.4 % (ref 11.5–14.6)
WBC: 5.8 10*3/uL (ref 4.5–10.5)

## 2013-09-18 MED ORDER — DRONEDARONE HCL 400 MG PO TABS: 400.0000 mg | ORAL_TABLET | Freq: Two times a day (BID) | ORAL | Status: DC

## 2013-09-18 MED ORDER — CARVEDILOL 6.25 MG PO TABS: 6.2500 mg | ORAL_TABLET | Freq: Two times a day (BID) | ORAL | Status: DC

## 2013-09-18 MED ORDER — APIXABAN 5 MG PO TABS
5.0000 mg | ORAL_TABLET | Freq: Two times a day (BID) | ORAL | Status: DC
Start: 2013-09-18 — End: 2013-12-18

## 2013-09-18 MED ORDER — APIXABAN 5 MG PO TABS: 5.0000 mg | ORAL_TABLET | Freq: Two times a day (BID) | ORAL | Status: DC

## 2013-09-18 NOTE — Patient Instructions (Addendum)
A full discussion of the nature of anticoagulants has been carried out.  A benefit/risk analysis has been presented to the patient, so that they understand the justification for choosing anticoagulation with Eliquis at this time.  The need for compliance is stressed.  Pt is aware to take the medication twice daily.  Side effects of potential bleeding are discussed, including unusual colored urine or stools, coughing up blood or coffee ground emesis, nose bleeds or serious fall or head trauma.  Discussed signs and symptoms of stroke. The patient should avoid any OTC items containing aspirin or ibuprofen.  Avoid alcohol consumption.   Call if any signs of abnormal bleeding.  Discussed financial obligations and resolved any difficulty in obtaining medication.    Will f/u with [facility] in 6 months for Eliquis F/U visit.

## 2013-09-18 NOTE — Progress Notes (Signed)
Pt was started on Eliquis 5 mg bid for Afib on 08/11/13.    Reviewed patients medication list.  Pt is not currently on any combined P-gp and strong CYP3A4 inhibitors/inducers (ketoconazole, traconazole, ritonavir, carbamazepine, phenytoin, rifampin, St. John's wort).  Reviewed labs.  SCr 0.9 (06/2013), Weight 69 kg, CrCl- 51 ml/min.  Dose appropriate based on CrCl.   Hgb and HCT scheduled for today, as well as a BMET.  A full discussion of the nature of anticoagulants has been carried out.  A benefit/risk analysis has been presented to the patient, so that they understand the justification for choosing anticoagulation with Eliquis at this time.  The need for compliance is stressed.  Pt is aware to take the medication twice daily.  Side effects of potential bleeding are discussed, including unusual colored urine or stools, coughing up blood or coffee ground emesis, nose bleeds or serious fall or head trauma.  Discussed signs and symptoms of stroke. The patient should avoid any OTC items containing aspirin or ibuprofen.  Avoid alcohol consumption.   Call if any signs of abnormal bleeding.  Discussed financial obligations and resolved any difficulty in obtaining medication.  Next lab test test in 6 months.   Patient is tolerating Eliquis very well, and has no complaints.  Patient's cost for medication was ~ $60 which is reasonable per patient, however she doesn't know if the cost has gone up for the new year.  She will check on this, and if the cost is too much she is going to call her insurance company and check on the cost of Xarelto and let us know.  [employer] (Medicare Part D) historically has covered both agents well, so hopefully there won't be a problem.  Patient is taking tramadol and tylenol prn pain.  She no longer takes her NSAIDs and wants to know what else can be done for neck pain that is only helped by an anti-inflammatory drug.  Patient is going to talk to her PCP about getting a prescription for  Voltaren gel.    Today's Scr = 1.0  Today's Hemoglobin = 12.2  Continue Eliquis 5 mg bid -  Patient notified of lab results and to continue dose.

## 2013-09-18 NOTE — Patient Instructions (Signed)
Your physician has requested that you have a carotid duplex. This test is an ultrasound of the carotid arteries in your neck. It looks at blood flow through these arteries that supply the brain with blood. Allow one hour for this exam. There are no restrictions or special instructions.  Your physician wants you to follow-up in: 3 months with Dr Shirlee LatchMcLean. (April 2015).  You will receive a reminder letter in the mail two months in advance. If you don't receive a letter, please call our office to schedule the follow-up appointment.

## 2013-09-18 NOTE — Progress Notes (Signed)
Patient ID: [place], female   DOB: [birth year], 80 y.o.   MRN: [id number] PCP: Dr. Lilyan Punt  80 yo presents for followup of paroxysmal atrial fibrillation.  She had been having runs of rapid tachypalpitations for weeks.  She was quite symptomatic with episodes.  Event monitor showed runs of atrial fibrillation with RVR.  I started her on Multaq for rhythm control given her significant symptoms and on apixaban.  Since starting apixaban, she has not been taking NSAIDs and is having considerable arthritic pain in her neck. However, she is no longer having runs of tachypalpitations.  She has only occasional brief flutters.  She did not tolerate diltiazem due to ankle swelling and was switched over to Coreg for rate control.  Echo in 11/14 showed EF 60-65% with mild MR. No exertional chest pain or dyspnea. No lightheadedness.  She does feel generally weak. She is concerned about the expense of Multaq.    ECG: NSR, septal Qs  Labs (10/14): LDL 90, HDL 41, K 4.1, creatinine 0.9  PMH: 1. LHC (8/10) with minimal nonobstructive disease.  ETT (10/13) with 2'53" exercise, nonspecific ECG changes with no evidence for ischemia.  2. Mitral valve prolapse: Echo (11/14) with EF 60-65%, mild MR.  3. Atrial fibrillation:  Event monitor (10/13) with PACs only.  Event monitor (11/14) showed atrial fibrillation with RVR. She does not tolerate metoprolol (nightmares).  She had ankle swelling with diltiazem.  4. HTN 5. Hyperlipidemia 6. Hysterectomy.  SH: Widow, lives in [place], nonsmoker.  2 daughters.   FH: Mother with CVA, CAD.  Father with CAD.   ROS: All systems reviewed and negative except as per HPI.   Current Outpatient Prescriptions  Medication Sig Dispense Refill  . apixaban (ELIQUIS) 5 MG TABS tablet Take 1 tablet (5 mg total) by mouth 2 (two) times daily.  180 tablet  3  . atorvastatin (LIPITOR) 10 MG tablet Take 10 mg by mouth daily.        . calcium-vitamin D (OSCAL WITH D) 500-200  MG-UNIT per tablet Take 1 tablet by mouth daily with breakfast.       . carvedilol (COREG) 6.25 MG tablet Take 1 tablet (6.25 mg total) by mouth 2 (two) times daily.  180 tablet  3  . dronedarone (MULTAQ) 400 MG tablet Take 1 tablet (400 mg total) by mouth 2 (two) times daily with a meal.  180 tablet  3  . Glycerin-Polysorbate 80 (REFRESH DRY EYE THERAPY OP) Apply to eye.      . hydrochlorothiazide 25 MG tablet Take 25 mg by mouth daily.        . Lutein 20 MG CAPS Take by mouth.        . Multiple Vitamins-Minerals (ICAPS) CAPS Take 2 capsules by mouth daily.      . traMADol (ULTRAM) 50 MG tablet Take 1 tablet (50 mg total) by mouth 3 (three) times daily as needed.  60 tablet  2   No current facility-administered medications for this visit.    BP 158/73  Pulse 60  Ht 5\' 4"  (1.626 m)  Wt 69.31 kg (152 lb 12.8 oz)  BMI 26.22 kg/m2 General: NAD Neck: No JVD, no thyromegaly or thyroid nodule.  Lungs: Clear to auscultation bilaterally with normal respiratory effort. CV: Nondisplaced PMI.  Heart regular S1/S2, no S3/S4, no murmur.  1+ right ankle edema.  Right carotid bruit.  Normal pedal pulses.  Abdomen: Soft, nontender, no hepatosplenomegaly, no distention.  Skin: Intact without lesions or  rashes.  Neurologic: Alert and oriented x 3.  Psych: Normal affect. Extremities: No clubbing or cyanosis.   Assessment/Plan: 1. Atrial fibrillation:  Paroxysmal.  She has symptomatic RVR when she goes into atrial fibrillation.  EF normal on recent echo with mild MR.  Runs of tachypalpitations have resolved with Multaq, but it is very expensive for her.  - Continue Multaq for now, she will see if it is going to be on the formulary for her insurance this year.  If not, she can switch to flecainide.  In that case, she would need a stress test.  - Conintue Eliquis 5 mg bid for anticoagulation.  2. HTN:  BP mildly elevated.  Will follow for now.  3. Hyperlipidemia: Good lipids in 10/14.  4. Carotid bruit:  On right.  Check carotid dopplers.  5. Pain: Would try not to take NSAIDs regularly while on anticoagulation. Would try tramadol if Tylenol is not strong enough.   Marca AnconaDalton Kassia Demarinis 09/18/2013 10:24 PM

## 2013-09-26 ENCOUNTER — Encounter (HOSPITAL_COMMUNITY): Payer: Medicare Other

## 2013-10-01 ENCOUNTER — Telehealth: Payer: Self-pay | Admitting: Cardiology

## 2013-10-01 ENCOUNTER — Ambulatory Visit (HOSPITAL_COMMUNITY): Payer: Medicare Other | Attending: Cardiology

## 2013-10-01 ENCOUNTER — Other Ambulatory Visit: Payer: Self-pay | Admitting: *Deleted

## 2013-10-01 ENCOUNTER — Encounter: Payer: Self-pay | Admitting: Cardiology

## 2013-10-01 DIAGNOSIS — I6529 Occlusion and stenosis of unspecified carotid artery: Secondary | ICD-10-CM

## 2013-10-01 DIAGNOSIS — R0989 Other specified symptoms and signs involving the circulatory and respiratory systems: Secondary | ICD-10-CM

## 2013-10-01 DIAGNOSIS — I4891 Unspecified atrial fibrillation: Secondary | ICD-10-CM

## 2013-10-01 DIAGNOSIS — E785 Hyperlipidemia, unspecified: Secondary | ICD-10-CM | POA: Insufficient documentation

## 2013-10-01 DIAGNOSIS — I10 Essential (primary) hypertension: Secondary | ICD-10-CM | POA: Insufficient documentation

## 2013-10-01 DIAGNOSIS — I658 Occlusion and stenosis of other precerebral arteries: Secondary | ICD-10-CM | POA: Insufficient documentation

## 2013-10-01 MED ORDER — DRONEDARONE HCL 400 MG PO TABS
400.0000 mg | ORAL_TABLET | Freq: Two times a day (BID) | ORAL | Status: DC
Start: 2013-10-01 — End: 2013-10-01

## 2013-10-01 MED ORDER — DRONEDARONE HCL 400 MG PO TABS
400.0000 mg | ORAL_TABLET | Freq: Two times a day (BID) | ORAL | Status: DC
Start: 2013-10-01 — End: 2014-11-16

## 2013-10-01 MED ORDER — CARVEDILOL 6.25 MG PO TABS
6.2500 mg | ORAL_TABLET | Freq: Two times a day (BID) | ORAL | Status: DC
Start: 2013-10-01 — End: 2014-01-08

## 2013-10-01 MED ORDER — CARVEDILOL 6.25 MG PO TABS
6.2500 mg | ORAL_TABLET | Freq: Two times a day (BID) | ORAL | Status: DC
Start: 2013-10-01 — End: 2013-10-01

## 2013-10-01 NOTE — Telephone Encounter (Signed)
Walk in Pt Form " Refill" Pt left Message For Thurston Holenne, gave to [employer]

## 2013-10-07 ENCOUNTER — Telehealth: Payer: Self-pay | Admitting: Cardiology

## 2013-10-07 NOTE — Telephone Encounter (Signed)
New message  ° ° °Returning call back to nurse.  °

## 2013-10-07 NOTE — Telephone Encounter (Signed)
Spoke with patient about recent carotid results 

## 2013-12-01 ENCOUNTER — Telehealth: Payer: Self-pay | Admitting: Family Medicine

## 2013-12-01 DIAGNOSIS — E782 Mixed hyperlipidemia: Secondary | ICD-10-CM

## 2013-12-01 DIAGNOSIS — Z79899 Other long term (current) drug therapy: Secondary | ICD-10-CM

## 2013-12-01 NOTE — Telephone Encounter (Signed)
Is patient due for bloodwork? °

## 2013-12-01 NOTE — Telephone Encounter (Signed)
Blood work orders placed in Epic. Patient notified. 

## 2013-12-01 NOTE — Telephone Encounter (Signed)
Pt had cbc and bmp on 09/18/13, and had lipid, bmp, liver on 10/24.

## 2013-12-01 NOTE — Telephone Encounter (Signed)
Lipid met 7 

## 2013-12-15 ENCOUNTER — Telehealth: Payer: Self-pay | Admitting: Family Medicine

## 2013-12-15 NOTE — Telephone Encounter (Signed)
Discussed with patient

## 2013-12-15 NOTE — Telephone Encounter (Signed)
INR is not recommended. Only recommended if on Coumadin. Occasionally we will do if the patient is having problems with excessive bleeding.

## 2013-12-15 NOTE — Telephone Encounter (Signed)
Patient is going to get her blood work done tomorrow and she wants to know if we can add to have her INR checked tomorrow also since she is taking a lot of heart medicines now.

## 2013-12-15 NOTE — Telephone Encounter (Signed)
Pt on elaquis, carvedilol, and multaq. She is NOT on coumadin. She is requesting an INR check with her bloodwork tomorrow. She has a follow up with her cardiologist on 04/30. Explained to patient she does not need INR check if she is not on coumadin but she wanted me to ask you anyway if you thought she needed it checked.

## 2013-12-16 LAB — BASIC METABOLIC PANEL
BUN: 12 mg/dL (ref 6–23)
CALCIUM: 9.4 mg/dL (ref 8.4–10.5)
CO2: 31 meq/L (ref 19–32)
CREATININE: 0.87 mg/dL (ref 0.50–1.10)
Chloride: 99 mEq/L (ref 96–112)
Glucose, Bld: 85 mg/dL (ref 70–99)
Potassium: 4.1 mEq/L (ref 3.5–5.3)
SODIUM: 139 meq/L (ref 135–145)

## 2013-12-16 LAB — LIPID PANEL
CHOL/HDL RATIO: 3.5 ratio
Cholesterol: 139 mg/dL (ref 0–200)
HDL: 40 mg/dL (ref >39)
LDL CALC: 74 mg/dL (ref 0–99)
Triglycerides: 127 mg/dL (ref <150)
VLDL: 25 mg/dL (ref 0–40)

## 2013-12-18 ENCOUNTER — Other Ambulatory Visit: Payer: Self-pay

## 2013-12-18 ENCOUNTER — Other Ambulatory Visit: Payer: Self-pay | Admitting: Family Medicine

## 2013-12-18 DIAGNOSIS — I4891 Unspecified atrial fibrillation: Secondary | ICD-10-CM

## 2013-12-18 MED ORDER — APIXABAN 5 MG PO TABS: 5.0000 mg | ORAL_TABLET | Freq: Two times a day (BID) | ORAL | Status: DC

## 2013-12-22 ENCOUNTER — Encounter: Payer: Self-pay | Admitting: Family Medicine

## 2013-12-22 ENCOUNTER — Ambulatory Visit (INDEPENDENT_AMBULATORY_CARE_PROVIDER_SITE_OTHER): Payer: Medicare Other | Admitting: Family Medicine

## 2013-12-22 VITALS — BP 130/70 | Ht 64.5 in | Wt 152.0 lb

## 2013-12-22 DIAGNOSIS — E785 Hyperlipidemia, unspecified: Secondary | ICD-10-CM

## 2013-12-22 DIAGNOSIS — I2510 Atherosclerotic heart disease of native coronary artery without angina pectoris: Secondary | ICD-10-CM

## 2013-12-22 DIAGNOSIS — I10 Essential (primary) hypertension: Secondary | ICD-10-CM

## 2013-12-22 NOTE — Progress Notes (Signed)
   Subjective:    Patient ID: [place] SageNancy S L[place]zy, female    DOB: 29-Sep-1927, 80 y.o.   MRN: [id number]  Hypertension This is a chronic problem. The current episode started more than 1 year ago. The problem has been gradually improving since onset. The problem is controlled. Pertinent negatives include no chest pain. There are no associated agents to hypertension. There are no known risk factors for coronary artery disease. Treatments tried: carvedilol. The current treatment provides significant improvement. There are no compliance problems.  There is no history of chronic renal disease.  Hyperlipidemia This is a recurrent problem. The current episode started more than 1 year ago. The problem is controlled. Recent lipid tests were reviewed and are normal. She has no history of chronic renal disease. Pertinent negatives include no chest pain. The current treatment provides significant improvement of lipids. There are no compliance problems.  Risk factors for coronary artery disease include dyslipidemia.  Patient states that she needs a NSAID prescribed for her neck and back pain.  She relates neck pain discomfort sometimes radiates into the upper trapezius denies radiation down the arms   Review of Systems  Constitutional: Negative for activity change, appetite change and fatigue.  Cardiovascular: Negative for chest pain.  Endocrine: Negative for polydipsia and polyphagia.  Genitourinary: Negative for frequency.  Neurological: Negative for weakness.  Psychiatric/Behavioral: Negative for confusion.       Objective:   Physical Exam  Vitals reviewed. Constitutional: She appears well-nourished. No distress.  Cardiovascular: Normal heart sounds.   No murmur heard. Pulmonary/Chest: Effort normal and breath sounds normal. No respiratory distress.  Musculoskeletal: She exhibits no edema.  Lymphadenopathy:    She has no cervical adenopathy.  Neurological: She is alert. She exhibits normal muscle  tone.  Psychiatric: Her behavior is normal.          Assessment & Plan:  #1 cardiac condition-stable. She will see the specialist and regular followups  #2 hyperlipidemia very good control. Continue current medication. #3 HTN good control continue current measures watch diet stay physically active as possible #4 cervical arthritis she is on anticoagulant I recommended no NSAIDs whatsoever. She may use Tylenol during the day tramadol if pain is severe if progressive troubles let us know currently no need for x-rays or MRI #5 patient concerned about the cost of cardiac medicine and she will discuss this with the cardiologist to see if other options are available better less disc expensive

## 2014-01-08 ENCOUNTER — Ambulatory Visit (INDEPENDENT_AMBULATORY_CARE_PROVIDER_SITE_OTHER): Payer: Medicare Other | Admitting: Cardiology

## 2014-01-08 ENCOUNTER — Encounter: Payer: Self-pay | Admitting: Cardiology

## 2014-01-08 VITALS — BP 106/92 | HR 65 | Wt 150.0 lb

## 2014-01-08 DIAGNOSIS — I4891 Unspecified atrial fibrillation: Secondary | ICD-10-CM

## 2014-01-08 DIAGNOSIS — I2510 Atherosclerotic heart disease of native coronary artery without angina pectoris: Secondary | ICD-10-CM

## 2014-01-08 DIAGNOSIS — I10 Essential (primary) hypertension: Secondary | ICD-10-CM

## 2014-01-08 MED ORDER — PROPRANOLOL HCL 10 MG PO TABS: 10.0000 mg | ORAL_TABLET | Freq: Two times a day (BID) | ORAL | Status: DC

## 2014-01-08 MED ORDER — HYDROCHLOROTHIAZIDE 25 MG PO TABS: ORAL_TABLET | ORAL | Status: DC

## 2014-01-08 NOTE — Patient Instructions (Addendum)
Stop coreg (carvedilol).  Start Inderal (propranolol) 10mg  two times a day.   Decrease HCTZ(hydrochlorothiazide) to 12.5mg  daily. This will be 1/2 of a 25mg  tablet daily.  Take and record your blood pressure regularly. I will call you in about 2 weeks and see if you are feeling better with these medication changes.  Your physician recommends that you schedule a follow-up appointment in: 2 months with Dr Shirlee LatchMcLean.

## 2014-01-08 NOTE — Progress Notes (Signed)
Patient ID: [place], female   DOB: June 19, 1928, 80 y.o.   MRN: [id number] PCP: Dr. Lilyan PuntScott [place]anson  80 yo presents for followup of paroxysmal atrial fibrillation.  She had been having runs of rapid tachypalpitations for weeks.  She was quite symptomatic with episodes.  Event monitor showed runs of atrial fibrillation with RVR.  I started her on Multaq for rhythm control given her significant symptoms and on apixaban.  Since starting apixaban, she has not been taking NSAIDs and is having considerable arthritic pain in her neck. However, she is no longer having runs of tachypalpitations.  She has only occasional brief flutters.  She did not tolerate diltiazem due to ankle swelling and was switched over to Coreg for rate control.  Echo in 11/14 showed EF 60-65% with mild MR. No exertional chest pain or dyspnea. No lightheadedness.    Since starting Coreg, she has felt more tired and has had diarrhea.  She was on propranolol in the past and tolerated that ok.  She wants to know if she can switch back to it.  Her BP is running on the low side today, SBP 106.   ECG: NSR, nonspecific T wave flattening  Labs (10/14): LDL 90, HDL 41, K 4.1, creatinine 0.9 Labs (4/15): LDL 74, HDL 40, K 4.1, creatinine 4.090.87  PMH: 1. LHC (8/10) with minimal nonobstructive disease.  ETT (10/13) with 2'53" exercise, nonspecific ECG changes with no evidence for ischemia.  2. Mitral valve prolapse: Echo (11/14) with EF 60-65%, mild MR.  3. Atrial fibrillation:  Event monitor (10/13) with PACs only.  Event monitor (11/14) showed atrial fibrillation with RVR. She does not tolerate metoprolol (nightmares).  She had ankle swelling with diltiazem. Possible fatigue/diarrhea with Coreg.  4. HTN 5. Hyperlipidemia 6. Hysterectomy. 7. Carotid disease: Carotid dopplers (1/15) with prominent plaque, mild stenosis.   SH: Widow, lives in [place], nonsmoker.  2 daughters.   FH: Mother with CVA, CAD.  Father with CAD.   ROS: All  systems reviewed and negative except as per HPI.   Current Outpatient Prescriptions  Medication Sig Dispense Refill  . apixaban (ELIQUIS) 5 MG TABS tablet Take 1 tablet (5 mg total) by mouth 2 (two) times daily.  180 tablet  3  . atorvastatin (LIPITOR) 10 MG tablet TAKE 1 TABLET EVERY DAY  90 tablet  0  . calcium-vitamin D (OSCAL WITH D) 500-200 MG-UNIT per tablet Take 1 tablet by mouth daily with breakfast.       . dronedarone (MULTAQ) 400 MG tablet Take 1 tablet (400 mg total) by mouth 2 (two) times daily with a meal.  180 tablet  3  . Glycerin-Polysorbate 80 (REFRESH DRY EYE THERAPY OP) Apply to eye.      . Lutein 20 MG CAPS Take by mouth.        . Multiple Vitamins-Minerals (ICAPS) CAPS Take 2 capsules by mouth daily.      . hydrochlorothiazide (HYDRODIURIL) 25 MG tablet 1/2 tablet (12.5mg ) daily  45 tablet  3  . propranolol (INDERAL) 10 MG tablet Take 1 tablet (10 mg total) by mouth 2 (two) times daily.  180 tablet  3   No current facility-administered medications for this visit.    BP 106/92  Pulse 65  Wt 150 lb (68.04 kg) General: NAD Neck: No JVD, no thyromegaly or thyroid nodule.  Lungs: Clear to auscultation bilaterally with normal respiratory effort. CV: Nondisplaced PMI.  Heart regular S1/S2, no S3/S4, no murmur.  No edema.  Right  carotid bruit.  Normal pedal pulses.  Abdomen: Soft, nontender, no hepatosplenomegaly, no distention.  Skin: Intact without lesions or rashes.  Neurologic: Alert and oriented x 3.  Psych: Normal affect. Extremities: No clubbing or cyanosis.   Assessment/Plan: 1. Atrial fibrillation:  Paroxysmal.  She has symptomatic RVR when she goes into atrial fibrillation.  EF normal on last echo with mild MR.  Runs of tachypalpitations have resolved with Multaq.  - Continue Multaq and Eliquis.  If Multaq becomes too expensive, could use flecainide but would need stress test.  - She has had fatigue and diarrhea since starting Coreg.  Will stop this and have  her go back to propranolol 10 mg bid.  We will call in 2 wks for BP check and to see if she feels better.  2. HTN:  BP on the low side, may contribute to fatigue.  Cut HCTZ in half to 12.5 mg daily.   3. Hyperlipidemia: Good lipids in 4/15.  4. Carotid bruit: Prominent carotid plaque with mild stenosis.  Repeat dopplers in 1/16.    [place] 01/08/2014

## 2014-01-09 ENCOUNTER — Other Ambulatory Visit: Payer: Self-pay | Admitting: Cardiology

## 2014-01-19 ENCOUNTER — Telehealth: Payer: Self-pay | Admitting: Cardiology

## 2014-01-19 NOTE — Telephone Encounter (Deleted)
Error

## 2014-01-29 ENCOUNTER — Telehealth: Payer: Self-pay | Admitting: *Deleted

## 2014-01-29 NOTE — Telephone Encounter (Signed)
Copied from 01/08/14 Dr Shirlee LatchMcLean office note:  Continue Multaq and Eliquis. If Multaq becomes too expensive, could use flecainide but would need stress test.  - She has had fatigue and diarrhea since starting Coreg. Will stop this and have her go back to propranolol 10 mg bid. We will call in 2 wks for BP check and to see if she feels better.     HTN: BP on the low side, may contribute to fatigue. Cut HCTZ in half to 12.5 mg daily.   01/29/14 LMTCB

## 2014-01-29 NOTE — Telephone Encounter (Signed)
Will forward to Dr McLean for review. 

## 2014-01-29 NOTE — Telephone Encounter (Signed)
LMTCB

## 2014-01-29 NOTE — Telephone Encounter (Signed)
Pt states her BP has been in the 108-116/55-60 range, pulse in the 60s since recent office visit with Dr Shirlee LatchMcLean. Pt states energy level is unchanged and she continues to have diarrhea, so she does not think it was related to coreg.

## 2014-01-29 NOTE — Telephone Encounter (Signed)
No changes

## 2014-01-30 NOTE — Telephone Encounter (Signed)
Pt advised.

## 2014-02-16 ENCOUNTER — Other Ambulatory Visit: Payer: Self-pay | Admitting: Family Medicine

## 2014-03-05 ENCOUNTER — Other Ambulatory Visit: Payer: Self-pay | Admitting: *Deleted

## 2014-03-10 ENCOUNTER — Ambulatory Visit (INDEPENDENT_AMBULATORY_CARE_PROVIDER_SITE_OTHER): Payer: Medicare Other | Admitting: Cardiology

## 2014-03-10 ENCOUNTER — Encounter: Payer: Self-pay | Admitting: Cardiology

## 2014-03-10 ENCOUNTER — Ambulatory Visit (INDEPENDENT_AMBULATORY_CARE_PROVIDER_SITE_OTHER): Payer: Medicare Other | Admitting: *Deleted

## 2014-03-10 VITALS — BP 156/78 | HR 58 | Ht 64.5 in | Wt 150.0 lb

## 2014-03-10 DIAGNOSIS — I480 Paroxysmal atrial fibrillation: Secondary | ICD-10-CM

## 2014-03-10 DIAGNOSIS — I10 Essential (primary) hypertension: Secondary | ICD-10-CM

## 2014-03-10 DIAGNOSIS — I4891 Unspecified atrial fibrillation: Secondary | ICD-10-CM

## 2014-03-10 DIAGNOSIS — I2510 Atherosclerotic heart disease of native coronary artery without angina pectoris: Secondary | ICD-10-CM

## 2014-03-10 DIAGNOSIS — E785 Hyperlipidemia, unspecified: Secondary | ICD-10-CM

## 2014-03-10 MED ORDER — HYDROCHLOROTHIAZIDE 25 MG PO TABS: 25.0000 mg | ORAL_TABLET | Freq: Every day | ORAL | Status: DC

## 2014-03-10 NOTE — Patient Instructions (Signed)
Increase HCTZ to 25mg  daily.   Your physician recommends that you return for lab work in: about 2 weeks--BMET/CBCd  Your physician wants you to follow-up in: 6 months with Dr Shirlee LatchMcLean. (December 2015). You will receive a reminder letter in the mail two months in advance. If you don't receive a letter, please call our office to schedule the follow-up appointment.

## 2014-03-10 NOTE — Progress Notes (Signed)
Patient ID: [place] SageNancy S [place], female   DOB: Mar 24, 1928, 80 y.o.   MRN: [id number] PCP: Dr. Lilyan PuntScott Luking  80 yo presents for followup of paroxysmal atrial fibrillation.  She had been having runs of rapid tachypalpitations when I saw her initially.  She was quite symptomatic with episodes.  Event monitor showed runs of atrial fibrillation with RVR.  I started her on Multaq for rhythm control given her significant symptoms and on apixaban.  She is no longer having runs of tachypalpitations.  She has only occasional brief flutters.  She did not tolerate diltiazem due to ankle swelling and was switched over to Coreg for rate control.  She did not tolerate Coreg and is back on propranolol, which she does ok with.  Echo in 11/14 showed EF 60-65% with mild MR. No exertional chest pain or dyspnea. No lightheadedness.  She continues to have neck pain and low back pain from OA.   At last appointment, I cut back on HCTZ but now SBP is running in the 150s.   ECG: NSR, nonspecific lateral/anterolateral T wave flattening  Labs (10/14): LDL 90, HDL 41, K 4.1, creatinine 0.9 Labs (4/15): LDL 74, HDL 40, K 4.1, creatinine 6.210.87  PMH: 1. LHC (8/10) with minimal nonobstructive disease.  ETT (10/13) with 2'53" exercise, nonspecific ECG changes with no evidence for ischemia.  2. Mitral valve prolapse: Echo (11/14) with EF 60-65%, mild MR.  3. Atrial fibrillation:  Event monitor (10/13) with PACs only.  Event monitor (11/14) showed atrial fibrillation with RVR. She does not tolerate metoprolol (nightmares).  She had ankle swelling with diltiazem. Possible fatigue/diarrhea with Coreg.  4. HTN 5. Hyperlipidemia 6. Hysterectomy. 7. Carotid disease: Carotid dopplers (1/15) with prominent plaque, mild stenosis.   SH: Widow, lives in [place], nonsmoker.  2 daughters.   FH: Mother with CVA, CAD.  Father with CAD.   ROS: All systems reviewed and negative except as per HPI.   Current Outpatient Prescriptions  Medication  Sig Dispense Refill  . apixaban (ELIQUIS) 5 MG TABS tablet Take 1 tablet (5 mg total) by mouth 2 (two) times daily.  180 tablet  3  . atorvastatin (LIPITOR) 10 MG tablet TAKE 1 TABLET EVERY DAY  90 tablet  0  . calcium-vitamin D (OSCAL WITH D) 500-200 MG-UNIT per tablet Take 1 tablet by mouth daily with breakfast.       . dronedarone (MULTAQ) 400 MG tablet Take 1 tablet (400 mg total) by mouth 2 (two) times daily with a meal.  180 tablet  3  . Glycerin-Polysorbate 80 (REFRESH DRY EYE THERAPY OP) Apply to eye.      . Lutein 20 MG CAPS Take by mouth.        . Multiple Vitamins-Minerals (ICAPS) CAPS Take 2 capsules by mouth daily.      . propranolol (INDERAL) 10 MG tablet Take 1 tablet (10 mg total) by mouth 2 (two) times daily.  60 tablet  1  . hydrochlorothiazide (HYDRODIURIL) 25 MG tablet Take 1 tablet (25 mg total) by mouth daily.  90 tablet  3   No current facility-administered medications for this visit.    BP 156/78  Pulse 58  Ht 5' 4.5" (1.638 m)  Wt 150 lb (68.04 kg)  BMI 25.36 kg/m2 General: NAD Neck: No JVD, no thyromegaly or thyroid nodule.  Lungs: Clear to auscultation bilaterally with normal respiratory effort. CV: Nondisplaced PMI.  Heart regular S1/S2, no S3/S4, no murmur.  Trace ankle edema.  Right carotid bruit.  Normal pedal pulses.  Abdomen: Soft, nontender, no hepatosplenomegaly, no distention.  Skin: Intact without lesions or rashes.  Neurologic: Alert and oriented x 3.  Psych: Normal affect. Extremities: No clubbing or cyanosis.   Assessment/Plan: 1. Atrial fibrillation:  Paroxysmal.  She has symptomatic RVR when she goes into atrial fibrillation.  EF normal on last echo with mild MR.  Runs of tachypalpitations have resolved with Multaq.  - Continue Multaq and Eliquis.  If Multaq becomes too expensive, could use flecainide but would need stress test.  - She will continue propranolol (did not tolerate Coreg or diltiazem).  2. HTN:  BP running higher, would increase  HCTZ back to 25 mg daily (she was on this dose in the past).  BMET in 2 wks.  3. Hyperlipidemia: Good lipids in 4/15.  4. Carotid bruit: Prominent carotid plaque with mild stenosis.  Repeat dopplers in 1/16.    [name] 03/10/2014

## 2014-03-10 NOTE — Progress Notes (Addendum)
Pt was started on Eliquis for Atrial  Fib on August 11 2013.    Reviewed patients medication list.  Pt is not currently on any combined P-gp and strong CYP3A4 inhibitors/inducers (ketoconazole, traconazole, ritonavir, carbamazepine, phenytoin, rifampin, St. John's wort).  Labs to be drawn in 2 weeks per Dr Shirlee LatchMclean will review labs at that time Weight 68.36.kg  A full  discussion of the nature of anticoagulants has been carried out.  A benefit/risk analysis has been presented to the patient, so that they understand the justification for choosing anticoagulation with Eliquis at this time.  The need for compliance is stressed.  Pt is aware to take the medication twice daily.  Side effects of potential bleeding are discussed, including unusual colored urine or stools, coughing up blood or coffee ground emesis, nose bleeds or serious fall or head trauma.  Discussed signs and symptoms of stroke. The patient should avoid any OTC items containing aspirin or ibuprofen.  Avoid alcohol consumption.   Call if any signs of abnormal bleeding.  Discussed financial obligations and pt states at present time is able to  obtain medication.  Next lab test test in 6 months.unless any changes in labs  Pt states has had no sign or symptom of bleeding or sign or symptom of stroke. States has seen only slight bleeding when brushes teeth and this is not every time brushes. Pt states has had no other problems and is tolerating this medication well  Pt saw Dr Shirlee LatchMclean today and increased HCTZ  to 25mg  daily and Dr Shirlee LatchMclean wants to have cbc and bmet done in 2 weeks rather than today  Pt instructed that will call in 2 weeks with the results of cbc and bmet and at that time will make return appt for her Eliquis and she states understanding.

## 2014-03-12 NOTE — Addendum Note (Signed)
Addended by: Jacqlyn KraussLANKFORD, ANNE M on: 03/12/2014 08:00 AM   Modules accepted: Orders

## 2014-03-19 ENCOUNTER — Other Ambulatory Visit: Payer: Self-pay | Admitting: Cardiology

## 2014-03-23 ENCOUNTER — Other Ambulatory Visit: Payer: Medicare Other

## 2014-04-10 ENCOUNTER — Telehealth: Payer: Self-pay | Admitting: Cardiology

## 2014-04-10 NOTE — Telephone Encounter (Signed)
Reviewed with Dr Wilmon PaliMcLean--OK to take glucosamine.  Per Dr Patrecia PaceMcLean--continue Eliquis for dental cleaning, advise dentist she is taking Eliquis and let Dr Shirlee LatchMcLean know if dentist requests holding Eliquis.

## 2014-04-10 NOTE — Telephone Encounter (Signed)
Pt going to dentist for dental cleaning. She is asking if she hold Eliquis prior to dental cleaning.   She is also asking if she can take glucosamine for joint pain.

## 2014-04-10 NOTE — Telephone Encounter (Signed)
Pt.notified

## 2014-04-10 NOTE — Telephone Encounter (Signed)
New message     Talk to Thurston Holenne about her medication

## 2014-04-15 ENCOUNTER — Telehealth: Payer: Self-pay

## 2014-04-15 ENCOUNTER — Other Ambulatory Visit (INDEPENDENT_AMBULATORY_CARE_PROVIDER_SITE_OTHER): Payer: Medicare Other

## 2014-04-15 DIAGNOSIS — I10 Essential (primary) hypertension: Secondary | ICD-10-CM

## 2014-04-15 LAB — BASIC METABOLIC PANEL
BUN: 17 mg/dL (ref 6–23)
CHLORIDE: 99 meq/L (ref 96–112)
CO2: 30 meq/L (ref 19–32)
CREATININE: 1 mg/dL (ref 0.4–1.2)
Calcium: 9.1 mg/dL (ref 8.4–10.5)
GFR: 56.74 mL/min — ABNORMAL LOW (ref >60.00)
GLUCOSE: 120 mg/dL — AB (ref 70–99)
Potassium: 3.8 mEq/L (ref 3.5–5.1)
Sodium: 137 mEq/L (ref 135–145)

## 2014-04-15 LAB — CBC WITH DIFFERENTIAL/PLATELET
BASOS ABS: 0 10*3/uL (ref 0.0–0.1)
BASOS PCT: 0.3 % (ref 0.0–3.0)
EOS PCT: 2.9 % (ref 0.0–5.0)
Eosinophils Absolute: 0.2 10*3/uL (ref 0.0–0.7)
HCT: 36 % (ref 36.0–46.0)
Hemoglobin: 12.2 g/dL (ref 12.0–15.0)
LYMPHS PCT: 22.8 % (ref 12.0–46.0)
Lymphs Abs: 1.3 10*3/uL (ref 0.7–4.0)
MCHC: 34 g/dL (ref 30.0–36.0)
MCV: 89.7 fl (ref 78.0–100.0)
MONOS PCT: 8.1 % (ref 3.0–12.0)
Monocytes Absolute: 0.5 10*3/uL (ref 0.1–1.0)
NEUTROS ABS: 3.9 10*3/uL (ref 1.4–7.7)
Neutrophils Relative %: 65.9 % (ref 43.0–77.0)
Platelets: 233 10*3/uL (ref 150.0–400.0)
RBC: 4.01 Mil/uL (ref 3.87–5.11)
RDW: 13.9 % (ref 11.5–15.5)
WBC: 5.9 10*3/uL (ref 4.0–10.5)

## 2014-04-15 NOTE — Telephone Encounter (Signed)
Patient was here for a test and when she was at check out she asked for samples of eliquis I gave them to her at the front desk

## 2014-04-16 ENCOUNTER — Telehealth: Payer: Self-pay | Admitting: *Deleted

## 2014-04-16 NOTE — Telephone Encounter (Signed)
Spoke with pt regarding results of HGB and HCT and SrCr regarding Eliquis . Hgb 12.2 HCT 36.0  SrCr 1.0 and pt instructed according to results of her labs she is on correct dose of Eliquis 5mg  bid and to continue this and made appt for recheck in 6 months and pt states understanding.

## 2014-05-05 ENCOUNTER — Telehealth: Payer: Self-pay

## 2014-05-05 NOTE — Telephone Encounter (Signed)
Called patient to let her know that I would put samples of multaq up front for her

## 2014-05-12 LAB — HM DIABETES EYE EXAM

## 2014-05-25 ENCOUNTER — Telehealth: Payer: Self-pay | Admitting: Family Medicine

## 2014-05-25 DIAGNOSIS — Z79899 Other long term (current) drug therapy: Secondary | ICD-10-CM

## 2014-05-25 DIAGNOSIS — M810 Age-related osteoporosis without current pathological fracture: Secondary | ICD-10-CM

## 2014-05-25 DIAGNOSIS — E782 Mixed hyperlipidemia: Secondary | ICD-10-CM

## 2014-05-25 NOTE — Telephone Encounter (Signed)
Does patient need BW for her 6 month followup?

## 2014-05-25 NOTE — Telephone Encounter (Signed)
Lipid/liv/vit d

## 2014-05-25 NOTE — Telephone Encounter (Signed)
Patient had CBC, Met 7 04/15/14- Lipid 4/15- Lipid, Liver, HgbA1c 10/14

## 2014-05-25 NOTE — Telephone Encounter (Signed)
Blood work ordered in Epic. Patient notified. 

## 2014-06-01 ENCOUNTER — Ambulatory Visit (INDEPENDENT_AMBULATORY_CARE_PROVIDER_SITE_OTHER): Payer: Medicare Other | Admitting: Family Medicine

## 2014-06-01 ENCOUNTER — Encounter: Payer: Self-pay | Admitting: Family Medicine

## 2014-06-01 VITALS — BP 112/78 | Temp 98.4°F | Ht 64.5 in | Wt 150.4 lb

## 2014-06-01 DIAGNOSIS — W57XXXA Bitten or stung by nonvenomous insect and other nonvenomous arthropods, initial encounter: Secondary | ICD-10-CM

## 2014-06-01 DIAGNOSIS — I2510 Atherosclerotic heart disease of native coronary artery without angina pectoris: Secondary | ICD-10-CM

## 2014-06-01 DIAGNOSIS — T148 Other injury of unspecified body region: Secondary | ICD-10-CM

## 2014-06-01 MED ORDER — TRIAMCINOLONE ACETONIDE 0.1 % EX CREA: TOPICAL_CREAM | CUTANEOUS | Status: DC

## 2014-06-01 NOTE — Progress Notes (Signed)
   Subjective:    Patient ID: [place], female    DOB: [birth year], 80 y.o.   MRN: [id number]  HPI Patient is here today for multiple tick bites she received on Friday. Patient has broken out in whelps on her legs and lower abdomen. Itching is noted at the sites.  Patient states that she has no other concerns at this time.  She states that multiple small takes were taken off yesterday. She states she was working in [employer] when this occurred  Review of Systems Patient denies fever chills relates itching burning.    Objective:   Physical Exam Has multiple bites on the hand and legs all of them have erythematous areas no sign of cellulitis       Assessment & Plan:  Multiple small tick bites warning signs discussed followup if problems many friends about is currently  Has standard visit in October will do flu shot and

## 2014-06-01 NOTE — Patient Instructions (Signed)
Tick Bite Information Ticks are insects that attach themselves to the skin and draw blood for food. There are various types of ticks. Common types include wood ticks and deer ticks. Most ticks live in shrubs and grassy areas. Ticks can climb onto your body when you make contact with leaves or grass where the tick is waiting. The most common places on the body for ticks to attach themselves are the scalp, neck, armpits, waist, and groin. Most tick bites are harmless, but sometimes ticks carry germs that cause diseases. These germs can be spread to a person during the tick's feeding process. The chance of a disease spreading through a tick bite depends on:   The type of tick.  Time of year.   How long the tick is attached.   Geographic location.  HOW CAN YOU PREVENT TICK BITES? Take these steps to help prevent tick bites when you are outdoors:  Wear protective clothing. Long sleeves and long pants are best.   Wear white clothes so you can see ticks more easily.  Tuck your pant legs into your socks.   If walking on a trail, stay in the middle of the trail to avoid brushing against bushes.  Avoid walking through areas with long grass.  Put insect repellent on all exposed skin and along boot tops, pant legs, and sleeve cuffs.   Check clothing, hair, and skin repeatedly and before going inside.   Brush off any ticks that are not attached.  Take a shower or bath as soon as possible after being outdoors.  WHAT IS THE PROPER WAY TO REMOVE A TICK? Ticks should be removed as soon as possible to help prevent diseases caused by tick bites. 1. If latex gloves are available, put them on before trying to remove a tick.  2. Using fine-point tweezers, grasp the tick as close to the skin as possible. You may also use curved forceps or a tick removal tool. Grasp the tick as close to its head as possible. Avoid grasping the tick on its body. 3. Pull gently with steady upward pressure until  the tick lets go. Do not twist the tick or jerk it suddenly. This may break off the tick's head or mouth parts. 4. Do not squeeze or crush the tick's body. This could force disease-carrying fluids from the tick into your body.  5. After the tick is removed, wash the bite area and your hands with soap and water or other disinfectant such as alcohol. 6. Apply a small amount of antiseptic cream or ointment to the bite site.  7. Wash and disinfect any instruments that were used.  Do not try to remove a tick by applying a hot match, petroleum jelly, or fingernail polish to the tick. These methods do not work and may increase the chances of disease being spread from the tick bite.  WHEN SHOULD YOU SEEK MEDICAL CARE? Contact your health care provider if you are unable to remove a tick from your skin or if a part of the tick breaks off and is stuck in the skin.  After a tick bite, you need to be aware of signs and symptoms that could be related to diseases spread by ticks. Contact your health care provider if you develop any of the following in the days or weeks after the tick bite:  Unexplained fever.  Rash. A circular rash that appears days or weeks after the tick bite may indicate the possibility of Lyme disease. The rash may resemble   a target with a bull's-eye and may occur at a different part of your body than the tick bite.  Redness and swelling in the area of the tick bite.   Tender, swollen lymph glands.   Diarrhea.   Weight loss.   Cough.   Fatigue.   Muscle, joint, or bone pain.   Abdominal pain.   Headache.   Lethargy or a change in your level of consciousness.  Difficulty walking or moving your legs.   Numbness in the legs.   Paralysis.  Shortness of breath.   Confusion.   Repeated vomiting.  Document Released: 08/25/2000 Document Revised: 06/18/2013 Document Reviewed: 02/05/2013 ExitCare Patient Information 2015 [employer]. This information is  not intended to replace advice given to you by your health care provider. Make sure you discuss any questions you have with your health care provider.  

## 2014-06-08 ENCOUNTER — Telehealth: Payer: Self-pay

## 2014-06-08 NOTE — Telephone Encounter (Signed)
Lm  For patient to come by and get multaq samples

## 2014-06-11 LAB — LIPID PANEL
Cholesterol: 145 mg/dL (ref 0–200)
HDL: 38 mg/dL — ABNORMAL LOW (ref >39)
LDL CALC: 73 mg/dL (ref 0–99)
Total CHOL/HDL Ratio: 3.8 Ratio
Triglycerides: 169 mg/dL — ABNORMAL HIGH (ref <150)
VLDL: 34 mg/dL (ref 0–40)

## 2014-06-11 LAB — HEPATIC FUNCTION PANEL
ALT: 11 U/L (ref 0–35)
AST: 16 U/L (ref 0–37)
Albumin: 4.2 g/dL (ref 3.5–5.2)
Alkaline Phosphatase: 72 U/L (ref 39–117)
BILIRUBIN INDIRECT: 0.6 mg/dL (ref 0.2–1.2)
BILIRUBIN TOTAL: 0.7 mg/dL (ref 0.2–1.2)
Bilirubin, Direct: 0.1 mg/dL (ref 0.0–0.3)
TOTAL PROTEIN: 6.4 g/dL (ref 6.0–8.3)

## 2014-06-12 LAB — VITAMIN D 25 HYDROXY (VIT D DEFICIENCY, FRACTURES): Vit D, 25-Hydroxy: 53 ng/mL (ref 30–89)

## 2014-06-19 ENCOUNTER — Encounter: Payer: Self-pay | Admitting: Family Medicine

## 2014-06-19 ENCOUNTER — Ambulatory Visit (INDEPENDENT_AMBULATORY_CARE_PROVIDER_SITE_OTHER): Payer: Medicare Other | Admitting: Family Medicine

## 2014-06-19 VITALS — BP 118/74 | Ht 64.5 in | Wt 150.0 lb

## 2014-06-19 DIAGNOSIS — M2550 Pain in unspecified joint: Secondary | ICD-10-CM

## 2014-06-19 DIAGNOSIS — I2510 Atherosclerotic heart disease of native coronary artery without angina pectoris: Secondary | ICD-10-CM

## 2014-06-19 DIAGNOSIS — Z23 Encounter for immunization: Secondary | ICD-10-CM

## 2014-06-19 DIAGNOSIS — E1169 Type 2 diabetes mellitus with other specified complication: Secondary | ICD-10-CM | POA: Insufficient documentation

## 2014-06-19 DIAGNOSIS — E785 Hyperlipidemia, unspecified: Secondary | ICD-10-CM

## 2014-06-19 DIAGNOSIS — E781 Pure hyperglyceridemia: Secondary | ICD-10-CM | POA: Insufficient documentation

## 2014-06-19 DIAGNOSIS — R739 Hyperglycemia, unspecified: Secondary | ICD-10-CM

## 2014-06-19 LAB — POCT GLYCOSYLATED HEMOGLOBIN (HGB A1C): HEMOGLOBIN A1C: 5.4

## 2014-06-19 NOTE — Progress Notes (Signed)
   Subjective:    Patient ID: Julie Swanson, female    DOB: 01/01/1928, 80 y.o.   MRN: [id number]  Hyperlipidemia This is a chronic problem. Recent lipid tests were reviewed and are variable. Associated symptoms include myalgias. Pertinent negatives include no chest pain or shortness of breath.   Patient states she is having back & neck problems. She says she's not sure if it's muscular , a disk  Or arthritis. She says she needs to be back on a anti inflammatory med. She denies tachycardia shortness of breath chest tightness pressure pain she gets out in the yard and does some activity she denies any rectal bleeding. Medications were reviewed she is compliant with all of her medicines Review of Systems  Constitutional: Negative for activity change, appetite change and fatigue.  HENT: Negative for congestion.   Respiratory: Negative for shortness of breath.   Cardiovascular: Negative for chest pain.  Gastrointestinal: Negative for abdominal pain, blood in stool and anal bleeding.  Endocrine: Negative for polydipsia and polyphagia.  Genitourinary: Negative for frequency.  Musculoskeletal: Positive for arthralgias, back pain and myalgias.  Neurological: Negative for weakness.  Psychiatric/Behavioral: Negative for confusion.       Objective:   Physical Exam  Vitals reviewed. Constitutional: She appears well-nourished. No distress.  Cardiovascular: Normal rate and normal heart sounds.   No murmur heard. Pulmonary/Chest: Effort normal and breath sounds normal. No respiratory distress.  Musculoskeletal: She exhibits no edema.  Lymphadenopathy:    She has no cervical adenopathy.  Neurological: She is alert. She exhibits normal muscle tone.  Psychiatric: Her behavior is normal.          Assessment & Plan:  #1 arthralgias in the back and the neck supportive measures only Tylenol when necessary no anti-inflammatories due to being on anticoagulant #2 atrial fibrillation under good  control continue medication #3 hyperlipidemia-her lipids actually look very good watch diet closely #4 hyperglycemia no sign of diabetes A1c looks good #5 cognitive skills doing good patient able to take care of her so

## 2014-06-23 NOTE — Telephone Encounter (Signed)
error 

## 2014-07-06 ENCOUNTER — Other Ambulatory Visit: Payer: Self-pay

## 2014-07-06 DIAGNOSIS — Z1231 Encounter for screening mammogram for malignant neoplasm of breast: Secondary | ICD-10-CM

## 2014-07-24 ENCOUNTER — Encounter (INDEPENDENT_AMBULATORY_CARE_PROVIDER_SITE_OTHER): Payer: Self-pay

## 2014-07-24 ENCOUNTER — Ambulatory Visit
Admission: RE | Admit: 2014-07-24 | Discharge: 2014-07-24 | Disposition: A | Discharge status: Home / Self Care | Payer: Medicare Other | Source: Ambulatory Visit

## 2014-07-24 DIAGNOSIS — Z1231 Encounter for screening mammogram for malignant neoplasm of breast: Secondary | ICD-10-CM

## 2014-08-09 ENCOUNTER — Telehealth: Payer: Self-pay | Admitting: Nurse Practitioner

## 2014-08-09 NOTE — Telephone Encounter (Signed)
Pts dtr called this afternoon to report that her mother, Ms. Julie Swanson, reported a 5 day h/o BRBPR.  She has not had any DOE, c/p, lightheadedness, or orthostatic symptoms.  Dtr says that they were active today w/o any significantly limitations.  Pt is on eliquis 2/2 PAF.  I recommended that she hold eliquis.  I advised that she come into ED tonight but pt/dtr prefer to wait and see PCP or GI in the AM.  Pts dtr will bring her to ED if anything changes/she becomes symptomatic or has recurrent BRBPR tonight.

## 2014-08-09 NOTE — Telephone Encounter (Signed)
Hold Eliquis for now, make sure she gets in to see GI asap.

## 2014-08-10 ENCOUNTER — Telehealth (INDEPENDENT_AMBULATORY_CARE_PROVIDER_SITE_OTHER): Payer: Self-pay | Admitting: *Deleted

## 2014-08-10 ENCOUNTER — Other Ambulatory Visit (INDEPENDENT_AMBULATORY_CARE_PROVIDER_SITE_OTHER): Payer: Self-pay | Admitting: *Deleted

## 2014-08-10 ENCOUNTER — Ambulatory Visit (INDEPENDENT_AMBULATORY_CARE_PROVIDER_SITE_OTHER): Payer: Medicare Other | Admitting: Family Medicine

## 2014-08-10 ENCOUNTER — Ambulatory Visit: Payer: Medicare Other | Admitting: Family Medicine

## 2014-08-10 ENCOUNTER — Encounter (INDEPENDENT_AMBULATORY_CARE_PROVIDER_SITE_OTHER): Payer: Self-pay | Admitting: *Deleted

## 2014-08-10 ENCOUNTER — Encounter: Payer: Self-pay | Admitting: Family Medicine

## 2014-08-10 VITALS — BP 138/70 | Ht 64.5 in | Wt 150.0 lb

## 2014-08-10 DIAGNOSIS — K625 Hemorrhage of anus and rectum: Secondary | ICD-10-CM

## 2014-08-10 DIAGNOSIS — I2510 Atherosclerotic heart disease of native coronary artery without angina pectoris: Secondary | ICD-10-CM

## 2014-08-10 LAB — POCT HEMOGLOBIN: HEMOGLOBIN: 14 g/dL (ref 12.2–16.2)

## 2014-08-10 NOTE — Telephone Encounter (Signed)
If colonoscopy looks ok, would like her to resume Eliquis.

## 2014-08-10 NOTE — Telephone Encounter (Signed)
Referring MD/PCP: luking   Procedure: tcs  Reason/Indication:  Rectal bleeding, fam hx colon ca  Has patient had this procedure before?  Yes, 2005  If so, when, by whom and where?    Is there a family history of colon cancer?  Yes, brother  Who?  What age when diagnosed?    Is patient diabetic?   no     Does patient have prosthetic heart valve?  no  Do you have a pacemaker?  no  Has patient ever had endocarditis? no  Has patient had joint replacement within last 12 months?  no  Does patient tend to be constipated or take laxatives? no  Is patient on Coumadin, Plavix and/or Aspirin?   Medications: see epic  Allergies: see epic  Medication Adjustment: already holding eliquis  Procedure date & time: 08/14/14 at 225

## 2014-08-10 NOTE — Progress Notes (Signed)
   Subjective:    Patient ID: Julie Swanson, female    DOB: [birth year], 80 y.o.   MRN: [id number]  Rectal Bleeding   Had rectal bleeding with BM four days straight and then stopped for 2 days started back yesterday and none today.  Left lower quadrant pain for the past 4 -5 days. Hb today 14.0. Taking tylenol and eliquis.  Dr. Shirlee LatchMcLean PA stopped eliquis was stopped last night.  Having diarrhea. Pt states the tylenol seems to bring on diarrhea.   Multiple days of thin blood in stool Always with BM Some ache in left side No awakening appetie good No  feevr Review of Systems  Gastrointestinal: Positive for hematochezia.   patient does relate some soreness in the lower abdomen she states this is been present last colonoscopy 2011 no tumors She denies any use of NSAIDs recently    Objective:   Physical Exam  Constitutional: She appears well-nourished. No distress.  Cardiovascular: Normal rate, regular rhythm and normal heart sounds.   No murmur heard. Pulmonary/Chest: Effort normal and breath sounds normal. No respiratory distress.  Abdominal: Soft. She exhibits no distension. There is no tenderness.  Musculoskeletal: She exhibits no edema.  Lymphadenopathy:    She has no cervical adenopathy.  Neurological: She is alert. She exhibits normal muscle tone.  Psychiatric: Her behavior is normal.  Vitals reviewed.         Assessment & Plan:  #1 rectal bleeding-hemodynamically stable #2 will need colonoscopy #3 spoke with gastroenterology they will be calling the patient to set this up for this week #4 paroxysmal atrial fibrillation she is not in atrial fib currently-continue to stay off anticoagulants until this issue is resolved Warning signs regarding hemorrhaging was discussed obvious ER if problems

## 2014-08-10 NOTE — Telephone Encounter (Signed)
lmtcb

## 2014-08-10 NOTE — Telephone Encounter (Signed)
Daughter Darel HongJudy calls back to inform us that mom saw Dr. Gerda DissLuking today, and is having a colonoscopy this Friday with Dr. Karilyn Cotaehman. No further bleeding today. Hbg 14.3. Will forward to Dr, Shirlee LatchMcLean

## 2014-08-11 NOTE — Telephone Encounter (Signed)
L/m for daughter to call back.

## 2014-08-11 NOTE — Telephone Encounter (Signed)
Daughter returned call. Advised per Dr. Shirlee LatchMcLean if colonoscopy is normal she should resume Eliquis.  She states she will call back on Friday to let us know results of colonoscopy.

## 2014-08-11 NOTE — Telephone Encounter (Signed)
agree

## 2014-08-14 ENCOUNTER — Encounter (HOSPITAL_COMMUNITY): Payer: Self-pay | Admitting: *Deleted

## 2014-08-14 ENCOUNTER — Ambulatory Visit (HOSPITAL_COMMUNITY)
Admission: RE | Admit: 2014-08-14 | Discharge: 2014-08-14 | Disposition: A | Discharge status: Home / Self Care | Payer: Medicare Other | Source: Ambulatory Visit | Attending: Internal Medicine | Admitting: Internal Medicine

## 2014-08-14 ENCOUNTER — Encounter (HOSPITAL_COMMUNITY)
Admission: RE | Disposition: A | Discharge status: Home / Self Care | Payer: Self-pay | Source: Ambulatory Visit | Attending: Internal Medicine

## 2014-08-14 DIAGNOSIS — I2510 Atherosclerotic heart disease of native coronary artery without angina pectoris: Secondary | ICD-10-CM | POA: Insufficient documentation

## 2014-08-14 DIAGNOSIS — K648 Other hemorrhoids: Secondary | ICD-10-CM | POA: Insufficient documentation

## 2014-08-14 DIAGNOSIS — I10 Essential (primary) hypertension: Secondary | ICD-10-CM | POA: Insufficient documentation

## 2014-08-14 DIAGNOSIS — Z800 Family history of malignant neoplasm of digestive organs: Secondary | ICD-10-CM

## 2014-08-14 DIAGNOSIS — Z79899 Other long term (current) drug therapy: Secondary | ICD-10-CM | POA: Diagnosis not present

## 2014-08-14 DIAGNOSIS — K625 Hemorrhage of anus and rectum: Secondary | ICD-10-CM

## 2014-08-14 DIAGNOSIS — Z7901 Long term (current) use of anticoagulants: Secondary | ICD-10-CM | POA: Insufficient documentation

## 2014-08-14 DIAGNOSIS — K5521 Angiodysplasia of colon with hemorrhage: Secondary | ICD-10-CM

## 2014-08-14 DIAGNOSIS — Q2733 Arteriovenous malformation of digestive system vessel: Secondary | ICD-10-CM | POA: Insufficient documentation

## 2014-08-14 DIAGNOSIS — K5730 Diverticulosis of large intestine without perforation or abscess without bleeding: Secondary | ICD-10-CM | POA: Insufficient documentation

## 2014-08-14 DIAGNOSIS — I341 Nonrheumatic mitral (valve) prolapse: Secondary | ICD-10-CM | POA: Diagnosis not present

## 2014-08-14 DIAGNOSIS — E785 Hyperlipidemia, unspecified: Secondary | ICD-10-CM | POA: Diagnosis not present

## 2014-08-14 DIAGNOSIS — I4891 Unspecified atrial fibrillation: Secondary | ICD-10-CM | POA: Insufficient documentation

## 2014-08-14 HISTORY — PX: COLONOSCOPY: SHX5424

## 2014-08-14 HISTORY — DX: Unspecified atrial fibrillation: I48.91

## 2014-08-14 SURGERY — COLONOSCOPY
Anesthesia: Moderate Sedation

## 2014-08-14 MED ORDER — MIDAZOLAM HCL 5 MG/5ML IJ SOLN
INTRAMUSCULAR | Status: DC | PRN
  Administered 2014-08-14 (×2): 1 mg via INTRAVENOUS

## 2014-08-14 MED ORDER — HYDROCORTISONE ACETATE 25 MG RE SUPP: 25.0000 mg | Freq: Every day | RECTAL | Status: DC

## 2014-08-14 MED ORDER — STERILE WATER FOR IRRIGATION IR SOLN
Status: DC | PRN
  Administered 2014-08-14: 17:00:00

## 2014-08-14 MED ORDER — MEPERIDINE HCL 50 MG/ML IJ SOLN
INTRAMUSCULAR | Status: DC | PRN
  Administered 2014-08-14 (×2): 20 mg via INTRAVENOUS
  Administered 2014-08-14: 10 mg via INTRAVENOUS

## 2014-08-14 MED ORDER — SODIUM CHLORIDE 0.9 % IV SOLN
INTRAVENOUS | Status: DC
  Administered 2014-08-14: 1000 mL via INTRAVENOUS

## 2014-08-14 NOTE — Op Note (Addendum)
COLONOSCOPY PROCEDURE REPORT  PATIENT:  Julie Swanson  MR#:  [id number] Birthdate:  [birth year], 80 y.o., female Endoscopist:  Dr. Malissa HippoNajeeb U. Viveka Wilmeth, MD Referred By:  Dr. Lilyan PuntScott Luking, MD Procedure Date: 08/14/2014  Procedure:   Colonoscopy  Indications:  Patient is 80 year old Caucasian female who experienced rectal bleeding 5 days in a row while on Eliquis. Bleeding has stopped since medication was discontinued and her H&H was normal. She also gives history of intermittent diarrhea. She denies abdominal pain anorexia. Family history significant for CRC and brother who was 2170 at the time of diagnosis and died due to unrelated causes.  Last colonoscopy was 10 years ago.  Informed Consent:  The procedure and risks were reviewed with the patient and informed consent was obtained.  Medications:  Demerol 50 mg IV Versed 2 mg IV  Description of procedure:  After a digital rectal exam was performed, that colonoscope was advanced from the anus through the rectum and colon to the area of the cecum, ileocecal valve and appendiceal orifice. The cecum was deeply intubated. These structures were well-seen and photographed for the record. From the level of the cecum and ileocecal valve, the scope was slowly and cautiously withdrawn. The mucosal surfaces were carefully surveyed utilizing scope tip to flexion to facilitate fold flattening as needed. The scope was pulled down into the rectum where a thorough exam including retroflexion was performed.  Findings:   Prep satisfactory. 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 This lesion was treated with APC with hemostasis. 2 small diverticula at sigmoid colon. Normal rectal mucosa. Large hemorrhoids above the dentate line along with small ones below it. One of these hemorrhoids had red area with pale center. No active bleeding noted.     Therapeutic/Diagnostic Maneuvers Performed:  See above  Complications:  None  Cecal  Withdrawal Time:  20 minutes  Impression:  Examination performed to cecum. Small AV malformation at ascending colon noted with oozing. It was coagulated with argon plasma coagulator. Two small diverticula at sigmoid colon. Large internal hemorrhoids felt to be source of patient's rectal bleeding. Small external hemorrhoids  Recommendations:  Standard instructions given. Benefiber 4 g by mouth daily at bedtime. Anusol suppository 1 per rectum daily at bedtime. Can resume anticoagulant next week but first will check with Dr. Lilyan PuntScott Luking. Patient will keep record of bleeding episodes until office visit in 4-6 weeks. If she has frequent bleeding she will benefit from either Ultroid therapy or banding.  Majestic Brister U  08/14/2014 5:58 PM  CC: Dr. Lilyan PuntLUKING,SCOTT, MD & Dr. Bonnetta BarryNo ref. provider found

## 2014-08-14 NOTE — H&P (Signed)
Julie Swanson is an 80 y.o. female.   Chief Complaint: Patient is here for colonoscopy. HPI: Patient is 80 year old Caucasian female who was recently begun on anticoagulants for new onset of atrial fibrillation. She experienced hematochezia 5 days and aerobic and she would pass moderate amount of bright red blood with her bowel movements. She did not experience abdominal pain. She was seen by Dr. looking in her H&H was normal. Eliquis was discontinued and she has not experienced any more episodes of bleeding. She has intermittent diarrhea but denies anorexia weight loss. Last colonoscopy was about 10 years ago. History significant for colon carcinoma and a brother was 70 at the time of diagnosis and died of unrelated causes.  Past Medical History  Diagnosis Date  . CAD (coronary artery disease)   . MVP (mitral valve prolapse)   . Palpitations   . HTN (hypertension)   . HLD (hyperlipidemia)   . Hyperkalemia   . Chest pain   . Osteoporosis   . Macular degeneration   . Arthritis   . Atrial fibrillation     Past Surgical History  Procedure Laterality Date  . Hysterectomy - unknown type    . Abdominal hysterectomy    . Cystectomy      BREAST  . Cataract extraction      BOTH EYES  . Colonoscopy      Family History  Problem Relation Age of Onset  . Stroke    . Heart attack    . Heart disease Mother   . Stroke Mother   . Heart disease Father   . Cancer Father   . Cancer Brother   . Heart disease Brother    Social History:  reports that she has never smoked. She has never used smokeless tobacco. She reports that she does not drink alcohol or use illicit drugs.  Allergies:  Allergies  Allergen Reactions  . Valtrex [Valacyclovir Hcl] Palpitations    Chest pain.  Marland Kitchen. Antihistamines, Chlorpheniramine-Type Other (See Comments)    Patient states it makes her feel like she's in "[place]"  . Evista [Raloxifene] Other (See Comments)    Causes knots  . Feldene [Piroxicam] Other  (See Comments)    unknown  . Toprol Xl [Metoprolol Tartrate]     Bad dreams  . Tropicamide     Dizziness, lethargic, non-responsive    Medications Prior to Admission  Medication Sig Dispense Refill  . acetaminophen (TYLENOL) 500 MG tablet Take 500 mg by mouth 2 (two) times daily as needed for moderate pain.    Marland Kitchen. atorvastatin (LIPITOR) 10 MG tablet TAKE 1 TABLET EVERY DAY 90 tablet 0  . calcium-vitamin D (OSCAL WITH D) 500-200 MG-UNIT per tablet Take 1 tablet by mouth daily with breakfast.     . cycloSPORINE (RESTASIS) 0.05 % ophthalmic emulsion Place 1 drop into both eyes 2 (two) times daily.     Marland Kitchen. dronedarone (MULTAQ) 400 MG tablet Take 1 tablet (400 mg total) by mouth 2 (two) times daily with a meal. 180 tablet 3  . hydrochlorothiazide (HYDRODIURIL) 25 MG tablet Take 1 tablet (25 mg total) by mouth daily. 90 tablet 3  . Lutein 20 MG CAPS Take 1 capsule by mouth daily.     . Multiple Vitamins-Minerals (ICAPS) CAPS Take 2 capsules by mouth daily.    . propranolol (INDERAL) 10 MG tablet TAKE ONE TABLET BY MOUTH TWICE DAILY 60 tablet 4  . apixaban (ELIQUIS) 5 MG TABS tablet Take 1 tablet (5 mg total)  by mouth 2 (two) times daily. (Patient not taking: Reported on 08/13/2014) 180 tablet 3    No results found for this or any previous visit (from the past 48 hour(s)). No results found.  ROS  Blood pressure 171/86, pulse 66, temperature 97.6 F (36.4 C), temperature source Oral, resp. rate 17, SpO2 99 %. Physical Exam  Constitutional: She appears well-developed and well-nourished.  HENT:  Mouth/Throat: Oropharynx is clear and moist.  Eyes: Conjunctivae are normal. No scleral icterus.  Neck: No thyromegaly present.  Cardiovascular: Normal rate and regular rhythm.   No murmur heard. Respiratory: Effort normal and breath sounds normal.  GI: Soft. She exhibits no distension and no mass. There is no tenderness.  Musculoskeletal: She exhibits no edema.  Lymphadenopathy:    She has no  cervical adenopathy.  Neurological: She is alert.  Skin: Skin is warm and dry.     Assessment/Plan Rectal bleeding. Family history of CRC in a sibling at late onset. Diagnostic colonoscopy.  Tracie Lindbloom U 08/14/2014, 4:42 PM

## 2014-08-14 NOTE — Discharge Instructions (Signed)
No aspirin or NSAIDs for 3 days. Resume usual medications and diet. Anusol HC suppository 1 per rectum daily at bedtime for 2 weeks. No driving for 24 hours. Keep diary as to frequency of bleeding episodes. Office visit in 4-6 weeks. Office will call.  Hemorrhoids Hemorrhoids are puffy (swollen) veins around the rectum or anus. Hemorrhoids can cause pain, itching, bleeding, or irritation. HOME CARE  Eat foods with fiber, such as whole grains, beans, nuts, fruits, and vegetables. Ask your doctor about taking products with added fiber in them (fibersupplements).  Drink enough fluid to keep your pee (urine) clear or pale yellow.  Exercise often.  Go to the bathroom when you have the urge to poop. Do not wait.  Avoid straining to poop (bowel movement).  Keep the butt area dry and clean. Use wet toilet paper or moist paper towels.  Medicated creams and medicine inserted into the anus (anal suppository) may be used or applied as told.  Only take medicine as told by your doctor.  Take a warm water bath (sitz bath) for 15-20 minutes to ease pain. Do this 3-4 times a day.  Place ice packs on the area if it is tender or puffy. Use the ice packs between the warm water baths.  Put ice in a plastic bag.  Place a towel between your skin and the bag.  Leave the ice on for 15-20 minutes, 03-04 times a day.  Do not use a donut-shaped pillow or sit on the toilet for a long time. GET HELP RIGHT AWAY IF:   You have more pain that is not controlled by treatment or medicine.  You have bleeding that will not stop.  You have trouble or are unable to poop (bowel movement).  You have pain or puffiness outside the area of the hemorrhoids. MAKE SURE YOU:   Understand these instructions.  Will watch your condition.  Will get help right away if you are not doing well or get worse. Document Released: 06/06/2008 Document Revised: 08/14/2012 Document Reviewed: 07/09/2012 [facility] Patient  Information 2015 [place], [place]. This information is not intended to replace advice given to you by your health care provider. Make sure you discuss any questions you have with your health care provider. Colonoscopy, Care After Refer to this sheet in the next few weeks. These instructions provide you with information on caring for yourself after your procedure. Your health care provider may also give you more specific instructions. Your treatment has been planned according to current medical practices, but problems sometimes occur. Call your health care provider if you have any problems or questions after your procedure. WHAT TO EXPECT AFTER THE PROCEDURE  After your procedure, it is typical to have the following:  A small amount of blood in your stool.  Moderate amounts of gas and mild abdominal cramping or bloating. HOME CARE INSTRUCTIONS  Do not drive, operate machinery, or sign important documents for 24 hours.  You may shower and resume your regular physical activities, but move at a slower pace for the first 24 hours.  Take frequent rest periods for the first 24 hours.  Walk around or put a warm pack on your abdomen to help reduce abdominal cramping and bloating.  Drink enough fluids to keep your urine clear or pale yellow.  You may resume your normal diet as instructed by your health care provider. Avoid heavy or fried foods that are hard to digest.  Avoid drinking alcohol for 24 hours or as instructed by your health  care provider.  Only take over-the-counter or prescription medicines as directed by your health care provider.  If a tissue sample (biopsy) was taken during your procedure:  Do not take aspirin or blood thinners for 7 days, or as instructed by your health care provider.  Do not drink alcohol for 7 days, or as instructed by your health care provider.  Eat soft foods for the first 24 hours. SEEK MEDICAL CARE IF: You have persistent spotting of blood in your stool  2-3 days after the procedure. SEEK IMMEDIATE MEDICAL CARE IF:  You have more than a small spotting of blood in your stool.  You pass large blood clots in your stool.  Your abdomen is swollen (distended).  You have nausea or vomiting.  You have a fever.  You have increasing abdominal pain that is not relieved with medicine. Document Released: 04/11/2004 Document Revised: 06/18/2013 Document Reviewed: 05/05/2013 [facility] Patient Information 2015 [place], [place]. This information is not intended to replace advice given to you by your health care provider. Make sure you discuss any questions you have with your health care provider.

## 2014-08-18 ENCOUNTER — Telehealth (INDEPENDENT_AMBULATORY_CARE_PROVIDER_SITE_OTHER): Payer: Self-pay | Admitting: *Deleted

## 2014-08-18 NOTE — Telephone Encounter (Signed)
Per Dr.Rehman the patient may start back today on her blood thinner. Julie Swanson was made aware.

## 2014-08-18 NOTE — Telephone Encounter (Signed)
When should Julie Swanson start back on her blood thinner? She just a procedure by Dr. Karilyn Cotaehman on 08/14/14. [name]'s return phone number is [phone].

## 2014-08-20 ENCOUNTER — Encounter (HOSPITAL_COMMUNITY): Payer: Self-pay | Admitting: Internal Medicine

## 2014-08-31 ENCOUNTER — Telehealth: Payer: Self-pay | Admitting: Cardiology

## 2014-08-31 ENCOUNTER — Telehealth (INDEPENDENT_AMBULATORY_CARE_PROVIDER_SITE_OTHER): Payer: Self-pay | Admitting: *Deleted

## 2014-08-31 NOTE — Telephone Encounter (Signed)
Spoke with patient's daughter, Darel HongJudy.  Pt had colonoscopy 08/14/14, pt restarted Eliquis 08/19/14. Pt had BRB Saturday AM, pt stopped Eliquis on her own starting Saturday evening and has not taken any since Saturday PM. Pt had BRB Sunday AM, but no BRB this morning. Pt has a call in to GI MD about BRB and recommendations. Pt's daughter asking if pt should continue to hold Eliquis I reviewed with Norma FredricksonLori Gerhardt, NP-flex PA/NP today. She recommended pt to continue to hold  Eliquis until further recommendations from GI MD. Pt's daughter, Darel HongJudy notified.

## 2014-08-31 NOTE — Telephone Encounter (Signed)
I spoke with the pt- she has been doing well until this past weekend. On Saturday she had bright red blood with her BM, on Sunday she saw bright red blood with her BM. Stools were a little loose. This morning the stool was more normal and she did not see any blood. She stopped taking the eloquis on Friday on her own. She has a small amount of pain on her left side but she said it is the same pain that she has always had. She doesn't feel like it is any different. She used the suppositories given to her after her TCS but she cannot afford them anymore because they were $434.00. She has called the cardiologist and left them a message as well.

## 2014-08-31 NOTE — Telephone Encounter (Signed)
Patient call returned. Suspect she is bleeding from internal hemorrhoids and may need further therapy if bleeding is recurrent She will go back on eliquis. Patient advised to take 4 g a Benefiber daily. Patient advised to keep symptom diary. She will call back if bleeding is significant by her estimation. Office visit in 4 weeks.

## 2014-08-31 NOTE — Telephone Encounter (Signed)
Talked with patient; She has stopped bleeding again. I believe she is bleeding from internal hemorrhoids I asked that she take Benefiber 4 g daily keep stool diary until office visit in 4 weeks. If she keeps keeps bleeding she may benefit from ultroid therapy for which she will have to be referred to Dr. Malvin JohnsBradford.

## 2014-08-31 NOTE — Telephone Encounter (Signed)
Julie Swanson had a procedure on 08/14/14 then started her blood thinner 3 days afterwards. She has started bleeding again every time she goes to the bathroom. Julie Swanson has put a call in to the doctor that prescribed the blood thinner medication. Her return phone number is [phone].

## 2014-08-31 NOTE — Telephone Encounter (Signed)
New message      Pt has rectal bleeding again.  She has been bleeding since sat the 19th.  Eliquis was stopped Saturday pm.  She had no blood today.  Colonoscopy 1wk ago was normal.  Please advise

## 2014-09-01 NOTE — Telephone Encounter (Signed)
Apt has been scheduled for 09/28/14 with Dorene Arerri Setzer, NP.

## 2014-09-09 ENCOUNTER — Ambulatory Visit (INDEPENDENT_AMBULATORY_CARE_PROVIDER_SITE_OTHER): Payer: Medicare Other | Admitting: *Deleted

## 2014-09-09 DIAGNOSIS — I4891 Unspecified atrial fibrillation: Secondary | ICD-10-CM

## 2014-09-09 LAB — CBC
HCT: 36 % (ref 36.0–46.0)
Hemoglobin: 12 g/dL (ref 12.0–15.0)
MCHC: 33.3 g/dL (ref 30.0–36.0)
MCV: 90.5 fl (ref 78.0–100.0)
PLATELETS: 247 10*3/uL (ref 150.0–400.0)
RBC: 3.98 Mil/uL (ref 3.87–5.11)
RDW: 13.2 % (ref 11.5–15.5)
WBC: 5.6 10*3/uL (ref 4.0–10.5)

## 2014-09-09 LAB — BASIC METABOLIC PANEL
BUN: 17 mg/dL (ref 6–23)
CALCIUM: 9.3 mg/dL (ref 8.4–10.5)
CHLORIDE: 100 meq/L (ref 96–112)
CO2: 30 meq/L (ref 19–32)
Creatinine, Ser: 0.9 mg/dL (ref 0.4–1.2)
GFR: 63.27 mL/min (ref >60.00)
GLUCOSE: 81 mg/dL (ref 70–99)
POTASSIUM: 4 meq/L (ref 3.5–5.1)
Sodium: 137 mEq/L (ref 135–145)

## 2014-09-09 NOTE — Progress Notes (Signed)
,  eli

## 2014-09-09 NOTE — Progress Notes (Signed)
Pt was started on Eliquis 5mg  bid  for  Atrial Fib on 08/11/2013.    Reviewed patients medication list.  Pt is nor  currently on any combined P-gp and strong CYP3A4 inhibitors/inducers (ketoconazole, traconazole, ritonavir, carbamazepine, phenytoin, rifampin, St. John's wort).  Reviewed labs.  SCr 0.9 , Weight 68.04kg.  Dose is correct   based on labs.   Hgb 12.0 and HCT36.0  A full discussion of the nature of anticoagulants has been carried out.  A benefit/risk analysis has been presented to the patient, so that they understand the justification for choosing anticoagulation with Eliquis at this time.  The need for compliance is stressed.  Pt is aware to take the medication twice daily.  Side effects of potential bleeding are discussed, including unusual colored urine or stools, coughing up blood or coffee ground emesis, nose bleeds or serious fall or head trauma.  Discussed signs and symptoms of stroke. The patient should avoid any OTC items containing aspirin or ibuprofen.  Avoid alcohol consumption.   Call if any signs of abnormal bleeding.  Discussed financial obligations and states that she was in donut hole and her Eliquis and Multaq was 1200 hundred  dollars for 90 days and states will talk with Dr Shirlee LatchMclean if continues to have this problem.  Next lab test test in 6 months.  Pt states started having rectal bleeding and Eliquis was held 6 days before colonoscopy on 08/14/2014 and restarted on the 8th then had rectal bleeding again on the 19th Dr Karilyn Cotaehman notified  and held Eliquis for 2 days and restarted and since 08/31/2014 has not had any more bleeding Colonoscopy found had internal hemorrhoids and these areas were cauterized  Has not missed any doses of Eliquis and has had no other  signs or symptoms of bleeding or stroke CBC and BMET done today Spoke with pt and informed of labs within normal limits and that she is on the correct dose of Eliquis and appt made to be rechecked in 6 months and she states  understanding

## 2014-09-10 ENCOUNTER — Telehealth: Payer: Self-pay | Admitting: Cardiology

## 2014-09-10 NOTE — Telephone Encounter (Signed)
Already done

## 2014-09-10 NOTE — Telephone Encounter (Signed)
New msg        Pt calling to get lab results. Please return call.

## 2014-09-28 ENCOUNTER — Ambulatory Visit (INDEPENDENT_AMBULATORY_CARE_PROVIDER_SITE_OTHER): Payer: Medicare Other | Admitting: Internal Medicine

## 2014-09-28 ENCOUNTER — Encounter (INDEPENDENT_AMBULATORY_CARE_PROVIDER_SITE_OTHER): Payer: Self-pay | Admitting: Internal Medicine

## 2014-09-28 ENCOUNTER — Other Ambulatory Visit (HOSPITAL_COMMUNITY): Payer: Self-pay | Admitting: Cardiology

## 2014-09-28 VITALS — BP 90/48 | HR 60 | Temp 97.6°F | Ht 64.0 in | Wt 148.5 lb

## 2014-09-28 DIAGNOSIS — I6523 Occlusion and stenosis of bilateral carotid arteries: Secondary | ICD-10-CM

## 2014-09-28 DIAGNOSIS — K625 Hemorrhage of anus and rectum: Secondary | ICD-10-CM

## 2014-09-28 DIAGNOSIS — Z800 Family history of malignant neoplasm of digestive organs: Secondary | ICD-10-CM

## 2014-09-28 NOTE — Patient Instructions (Signed)
Continue the Benefiber.  OV in 1 year 

## 2014-09-28 NOTE — Progress Notes (Signed)
Subjective:    Patient ID: Julie Swanson, female    DOB: 07-08-1928, 81 y.o.   MRN: [id number]  HPI Here today for f/u after recent colonoscopy by Dr. Karilyn Cota. Underwent colonoscopy in December for rectal bleeding. Her last colonoscopy was 10 yrs ago.  She says she is doing better. She has not had any further rectal bleeding.  She is having a BM every morning and sometimes twice a day. No melena or BRRB. Sometimes her stools are loose. Appetite is good. No weight loss. Family hx of colon cancer in a berother ago 70.    CBC    Component Value Date/Time   WBC 5.6 09/09/2014 0839   RBC 3.98 09/09/2014 0839   HGB 12.0 09/09/2014 0839   HGB 14.0 08/10/2014 1324   HCT 36.0 09/09/2014 0839   PLT 247.0 09/09/2014 0839   MCV 90.5 09/09/2014 0839   MCHC 33.3 09/09/2014 0839   RDW 13.2 09/09/2014 0839   LYMPHSABS 1.3 04/15/2014 0837   MONOABS 0.5 04/15/2014 0837   EOSABS 0.2 04/15/2014 0837   BASOSABS 0.0 04/15/2014 0837      08/14/2014 Colonoscopy: rectal bleeding.  Dr. Karilyn Cota Indications: Patient is 81 year old Caucasian female who experienced rectal bleeding 5 days in a row while on Eliquis. Bleeding has stopped since medication was discontinued and her H&H was normal. She also gives history of intermittent diarrhea. She denies abdominal pain anorexia. Family history significant for CRC and brother who was 2 at the time of diagnosis and died due to unrelated causes. Last colonoscopy was 10 years ago. Examination performed to cecum. Small AV malformation at ascending colon noted with oozing. It was coagulated with argon plasma coagulator. Two small diverticula at sigmoid colon. Large internal hemorrhoids felt to be source of patient's rectal bleeding. Small external hemorrhoids diary until office visit in 4 weeks. If she keeps keeps bleeding she may benefit from ultroid therapy for which she will have to be referred to Dr. Malvin Johns.  11/01/2009 Colonoscopy: rectal bleeding. Dr.  Karilyn Cota: Exam performed to cecum. Single small diverticulum at sigmoid colon. Large internal hemorrhoid felt to be source of patient's rectal bleeding.    Review of Systems Past Medical History  Diagnosis Date  . CAD (coronary artery disease)   . MVP (mitral valve prolapse)   . Palpitations   . HTN (hypertension)   . HLD (hyperlipidemia)   . Hyperkalemia   . Chest pain   . Osteoporosis   . Macular degeneration   . Arthritis   . Atrial fibrillation     Past Surgical History  Procedure Laterality Date  . Hysterectomy - unknown type    . Abdominal hysterectomy    . Cystectomy      BREAST  . Cataract extraction      BOTH EYES  . Colonoscopy    . Colonoscopy N/A 08/14/2014    Procedure: COLONOSCOPY;  Surgeon: Malissa Hippo, MD;  Location: [facility];  Service: Endoscopy;  Laterality: N/A;  225    Allergies  Allergen Reactions  . Valtrex [Valacyclovir Hcl] Palpitations    Chest pain.  Marland Kitchen Antihistamines, Chlorpheniramine-Type Other (See Comments)    Patient states it makes her feel like she's in "[place]"  . Evista [Raloxifene] Other (See Comments)    Causes knots  . Feldene [Piroxicam] Other (See Comments)    unknown  . Toprol Xl [Metoprolol Tartrate]     Bad dreams  . Tropicamide     Dizziness, lethargic, non-responsive  Current Outpatient Prescriptions on File Prior to Visit  Medication Sig Dispense Refill  . acetaminophen (TYLENOL) 500 MG tablet Take 500 mg by mouth 2 (two) times daily as needed for moderate pain.    Marland Kitchen. apixaban (ELIQUIS) 5 MG TABS tablet Take 1 tablet (5 mg total) by mouth 2 (two) times daily. 180 tablet 3  . atorvastatin (LIPITOR) 10 MG tablet TAKE 1 TABLET EVERY DAY 90 tablet 0  . calcium-vitamin D (OSCAL WITH D) 500-200 MG-UNIT per tablet Take 1 tablet by mouth daily with breakfast.     . cycloSPORINE (RESTASIS) 0.05 % ophthalmic emulsion Place 1 drop into both eyes 2 (two) times daily.     Marland Kitchen. dronedarone (MULTAQ) 400 MG tablet Take 1  tablet (400 mg total) by mouth 2 (two) times daily with a meal. 180 tablet 3  . hydrochlorothiazide (HYDRODIURIL) 25 MG tablet Take 1 tablet (25 mg total) by mouth daily. 90 tablet 3  . Lutein 20 MG CAPS Take 1 capsule by mouth daily.     . Multiple Vitamins-Minerals (ICAPS) CAPS Take 2 capsules by mouth daily.    . propranolol (INDERAL) 10 MG tablet TAKE ONE TABLET BY MOUTH TWICE DAILY 60 tablet 4   No current facility-administered medications on file prior to visit.        Objective:   Physical Exam   Filed Vitals:   09/28/14 0910  Height: 5\' 4"  (1.626 m)  Weight: 148 lb 8 oz (67.359 kg)    Alert and oriented. Skin warm and dry. Oral mucosa is moist.   . Sclera anicteric, conjunctivae is pink. Thyroid not enlarged. No cervical lymphadenopathy. Lungs clear. Heart regular rate and rhythm.  Abdomen is soft. Bowel sounds are positive. No hepatomegaly. No abdominal masses felt. No tenderness.  No edema to lower extremities.      Assessment & Plan:  Rectal bleeding felt to be hemorrhoidal. No further episodes of rectal bleeding. CBC normal 09/09/2014. OV in 1 year. If any further rectal bleeding will refer to Dr Malvin JohnsBradford forultroid therapy.

## 2014-09-30 ENCOUNTER — Telehealth: Payer: Self-pay | Admitting: Cardiology

## 2014-09-30 ENCOUNTER — Encounter: Payer: Self-pay | Admitting: Cardiology

## 2014-09-30 NOTE — Telephone Encounter (Signed)
Left message for patient or daughter to call back.  

## 2014-09-30 NOTE — Telephone Encounter (Signed)
Spoke with Julie Swanson, patient's daughter, who reports patient is having rectal bleeding again. They saw Dr. Karilyn Cotaehman yesterday. Stopped the Eliquis. She had an episode in late November. Colonoscopy was done in December which showed internal hemorrhoids. Stopped Eliquis then, but resumed after bleeding stopped. They have an appointment with Dr. Shirlee LatchMcLean Friday, weather permitting. Please advise.

## 2014-09-30 NOTE — Telephone Encounter (Signed)
New Msg          Pt daughter Darel HongJudy calling, pt is having rectal bleeding today.   Pt is stopping Eliquis at the moment.  Please return [name]'s call.

## 2014-09-30 NOTE — Telephone Encounter (Signed)
This is a duplicate message, see telephone note 09/30/14, nurse returned call to Darel HongJudy pt's daughter at 2:10pm, message has been forwarded to Dr Shirlee LatchMcLean and his nurse appropriately.  This encounter was created in error - please disregard.

## 2014-09-30 NOTE — Telephone Encounter (Signed)
Left message for daughter to call back.  

## 2014-09-30 NOTE — Telephone Encounter (Signed)
Stay off for 5 days or so then resume if no more bleeding.

## 2014-09-30 NOTE — Telephone Encounter (Signed)
Patient's daughter called back. Informed her patient needs to stay off Eliquis for five days and resume if no more bleeding after the five days. Patient's daughter Darel HongJudy verbalized understanding.

## 2014-09-30 NOTE — Telephone Encounter (Signed)
New Msg         Pt daughterJudy wants to reschedule appt 10/02/14 due to fear of bad weather, but doesn't want to wait until April to be seen.   Pt is having rectal bleeding that began this morning and this is the third incident of bleeding. Pt is stopping Eliquis today.  Please return [name]'s call.

## 2014-10-01 ENCOUNTER — Encounter: Payer: Self-pay | Admitting: *Deleted

## 2014-10-02 ENCOUNTER — Encounter (HOSPITAL_COMMUNITY): Payer: Medicare Other

## 2014-10-02 ENCOUNTER — Ambulatory Visit: Payer: Medicare Other | Admitting: Cardiology

## 2014-10-20 ENCOUNTER — Telehealth: Payer: Self-pay | Admitting: *Deleted

## 2014-10-20 NOTE — Telephone Encounter (Signed)
Pt advised,verbalized understanding. 

## 2014-10-20 NOTE — Telephone Encounter (Signed)
A  hand written note as follows with no date was left on my desk today:  Anne-Dr McLean's Nurse  Raiford NobleNancy Fjelstad 20[birth year]-09-14  Today--Experienced Bright Red Blood @ BM- Same as before each time.  I have discontinued Eliquis today--Please note for my record.  Please call later this afternoon [phone]

## 2014-10-20 NOTE — Telephone Encounter (Signed)
Risk will be about the same with the other agents.  Make sure she is not taking ASA or NSAIDs.  Would restart in 4-5 days.  If bleeds again, will decrease dose to 2.5 mg bid.

## 2014-10-20 NOTE — Telephone Encounter (Signed)
I spoke with patient today-  Pt states she had BM with bright red blood yesterday. Pt states she had taken her Eliquis yesterday morning but has not taken any Eliquis since yesterday morning. Pt states she has BM today and did not see any BRB. Pt had colonoscopy recently and was told internal hemorrhoids were the cause of her BRB. Pt states she plans to stay off Eliquis for 4-5 days then restart. She states since colonoscopy, when she has BRB, she can hold Eliquis for 4-5 days and does not have more BRB, then restarts Eliquis and about 7-10 days later she has BRB again.   Pt asking if Dr Shirlee LatchMcLean can either decrease dose of Eliquis or prescribe another medication.   Pt advised I will forward to Dr Shirlee LatchMcLean for review.

## 2014-10-29 ENCOUNTER — Encounter: Payer: Self-pay | Admitting: Cardiology

## 2014-10-29 ENCOUNTER — Ambulatory Visit (HOSPITAL_COMMUNITY): Payer: Medicare Other | Attending: Cardiology | Admitting: Cardiology

## 2014-10-29 ENCOUNTER — Ambulatory Visit (INDEPENDENT_AMBULATORY_CARE_PROVIDER_SITE_OTHER): Payer: Medicare Other | Admitting: Cardiology

## 2014-10-29 VITALS — BP 124/72 | HR 64 | Ht 64.0 in | Wt 151.0 lb

## 2014-10-29 DIAGNOSIS — R0989 Other specified symptoms and signs involving the circulatory and respiratory systems: Secondary | ICD-10-CM | POA: Insufficient documentation

## 2014-10-29 DIAGNOSIS — R42 Dizziness and giddiness: Secondary | ICD-10-CM | POA: Insufficient documentation

## 2014-10-29 DIAGNOSIS — I480 Paroxysmal atrial fibrillation: Secondary | ICD-10-CM

## 2014-10-29 DIAGNOSIS — I6523 Occlusion and stenosis of bilateral carotid arteries: Secondary | ICD-10-CM | POA: Insufficient documentation

## 2014-10-29 LAB — CBC WITH DIFFERENTIAL/PLATELET
BASOS ABS: 0 10*3/uL (ref 0.0–0.1)
Basophils Relative: 0.6 % (ref 0.0–3.0)
EOS ABS: 0.2 10*3/uL (ref 0.0–0.7)
Eosinophils Relative: 3.3 % (ref 0.0–5.0)
HEMATOCRIT: 36.7 % (ref 36.0–46.0)
Hemoglobin: 12.6 g/dL (ref 12.0–15.0)
LYMPHS ABS: 1.8 10*3/uL (ref 0.7–4.0)
LYMPHS PCT: 32.7 % (ref 12.0–46.0)
MCHC: 34.3 g/dL (ref 30.0–36.0)
MCV: 88.1 fl (ref 78.0–100.0)
Monocytes Absolute: 0.6 10*3/uL (ref 0.1–1.0)
Monocytes Relative: 10.4 % (ref 3.0–12.0)
NEUTROS ABS: 2.9 10*3/uL (ref 1.4–7.7)
Neutrophils Relative %: 53 % (ref 43.0–77.0)
Platelets: 254 10*3/uL (ref 150.0–400.0)
RBC: 4.16 Mil/uL (ref 3.87–5.11)
RDW: 13.5 % (ref 11.5–15.5)
WBC: 5.6 10*3/uL (ref 4.0–10.5)

## 2014-10-29 NOTE — Progress Notes (Signed)
Patient ID: Julie SageNancy S Swanson, female   DOB: 05/07/1928, 81 y.o.   MRN: [id number] PCP: Dr. Lilyan PuntScott Luking  81 yo presents for followup of paroxysmal atrial fibrillation.  She had been having runs of rapid tachypalpitations when I saw her initially.  She was quite symptomatic with episodes.  Event monitor showed runs of atrial fibrillation with RVR.  I started her on Multaq for rhythm control given her significant symptoms and on apixaban.  She is no longer having runs of tachypalpitations.  She has only occasional brief flutters.  She did not tolerate diltiazem due to ankle swelling and was switched over to Coreg for rate control.  She did not tolerate Coreg and is back on propranolol, which she does ok with.  Echo in 11/14 showed EF 60-65% with mild MR. No exertional chest pain or dyspnea. No lightheadedness.  She has had occasional rectal bleeding recently that seems to have been relatively mild.  She saw a gastroenterologist and had c-scope showing hemorrhoids that were thought to be the etiology of the bleeding.  Currently, no bleeding.  She is on Eliquis. Hemoglobin stable when checked in 12/15.    ECG: NSR, iRBBB, nonspecific T wave flattening  Labs (10/14): LDL 90, HDL 41, K 4.1, creatinine 0.9 Labs (4/15): LDL 74, HDL 40, K 4.1, creatinine 4.090.87 Labs (10/15): LDL 73, HDL 38 Labs (12/15): K 4, creatinine 0.9, HCT 36  PMH: 1. LHC (8/10) with minimal nonobstructive disease.  ETT (10/13) with 2'53" exercise, nonspecific ECG changes with no evidence for ischemia.  2. Mitral valve prolapse: Echo (11/14) with EF 60-65%, mild MR.  3. Atrial fibrillation:  Event monitor (10/13) with PACs only.  Event monitor (11/14) showed atrial fibrillation with RVR. She does not tolerate metoprolol (nightmares).  She had ankle swelling with diltiazem. Possible fatigue/diarrhea with Coreg.  4. HTN 5. Hyperlipidemia 6. Hysterectomy. 7. Carotid disease: Carotid dopplers (1/15) with prominent plaque, mild stenosis.  8.  Internal hemorrhoids  SH: Widow, lives in [place], nonsmoker.  2 daughters.   FH: Mother with CVA, CAD.  Father with CAD.   ROS: All systems reviewed and negative except as per HPI.   Current Outpatient Prescriptions  Medication Sig Dispense Refill  . acetaminophen (TYLENOL) 500 MG tablet Take 500 mg by mouth 2 (two) times daily as needed for moderate pain.    Marland Kitchen. apixaban (ELIQUIS) 5 MG TABS tablet Take 1 tablet (5 mg total) by mouth 2 (two) times daily. 180 tablet 3  . atorvastatin (LIPITOR) 10 MG tablet TAKE 1 TABLET EVERY DAY 90 tablet 0  . calcium-vitamin D (OSCAL WITH D) 500-200 MG-UNIT per tablet Take 1 tablet by mouth daily with breakfast.     . cycloSPORINE (RESTASIS) 0.05 % ophthalmic emulsion Place 1 drop into both eyes 2 (two) times daily.     Marland Kitchen. dronedarone (MULTAQ) 400 MG tablet Take 1 tablet (400 mg total) by mouth 2 (two) times daily with a meal. 180 tablet 3  . hydrochlorothiazide (HYDRODIURIL) 25 MG tablet Take 1 tablet (25 mg total) by mouth daily. 90 tablet 3  . Lutein 20 MG CAPS Take 1 capsule by mouth daily.     . Multiple Vitamins-Minerals (ICAPS) CAPS Take 2 capsules by mouth daily.    . propranolol (INDERAL) 10 MG tablet TAKE ONE TABLET BY MOUTH TWICE DAILY 60 tablet 4  . Wheat Dextrin (BENEFIBER DRINK MIX PO) Take by mouth.     No current facility-administered medications for this visit.    BP 124/72  mmHg  Pulse 64  Ht  (1.626 m)  Wt 151 lb (68.493 kg)  BMI 25.91 kg/m2 General: NAD Neck: No JVD, no thyromegaly or thyroid nodule.  Lungs: Clear to auscultation bilaterally with normal respiratory effort. CV: Nondisplaced PMI.  Heart regular S1/S2, no S3/S4, no murmur.  1+ ankle edema bilaterally.  Right carotid bruit.  Normal pedal pulses.  Abdomen: Soft, nontender, no hepatosplenomegaly, no distention.  Skin: Intact without lesions or rashes.  Neurologic: Alert and oriented x 3.  Psych: Normal affect. Extremities: No clubbing or cyanosis.    Assessment/Plan: 1. Atrial fibrillation:  Paroxysmal.  She has symptomatic RVR when she goes into atrial fibrillation.  EF normal on last echo with mild MR.  Runs of tachypalpitations have resolved with Multaq.  - Continue Multaq and Eliquis.  If Multaq becomes too expensive, could use flecainide but would need stress test.  - She will continue propranolol (did not tolerate Coreg or diltiazem).  - She continues to have occasional rectal bleeding that has been thought to be hemorrhoidal (had colonoscopy by GI).  I would like her to followup with GI about this.  Would continue current apixaban for now.  Check CBC today.  2. HTN:  BP controlled.   3. Hyperlipidemia: Good lipids in 10/15.  4. Carotid bruit: Carotid dopplers done today, awaiting report.   Marca Ancona 10/29/2014

## 2014-10-29 NOTE — Progress Notes (Signed)
Carotid duplex performed 

## 2014-10-29 NOTE — Patient Instructions (Signed)
Your physician recommends that you have  lab work today--CBCd  Your physician wants you to follow-up in: 6 months with Dr Shirlee LatchMcLean. (August 2016). You will receive a reminder letter in the mail two months in advance. If you don't receive a letter, please call our office to schedule the follow-up appointment.

## 2014-11-09 ENCOUNTER — Telehealth: Payer: Self-pay | Admitting: Family Medicine

## 2014-11-09 DIAGNOSIS — E785 Hyperlipidemia, unspecified: Secondary | ICD-10-CM

## 2014-11-09 DIAGNOSIS — I10 Essential (primary) hypertension: Secondary | ICD-10-CM

## 2014-11-09 DIAGNOSIS — R739 Hyperglycemia, unspecified: Secondary | ICD-10-CM

## 2014-11-09 DIAGNOSIS — Z79899 Other long term (current) drug therapy: Secondary | ICD-10-CM

## 2014-11-09 NOTE — Telephone Encounter (Signed)
06/19/14: HgbA1C, lip, liv, vit D

## 2014-11-09 NOTE — Telephone Encounter (Signed)
Patient requesting order for BW for upcoming appointment.

## 2014-11-09 NOTE — Telephone Encounter (Signed)
Lipid, liver, metabolic 7 

## 2014-11-09 NOTE — Telephone Encounter (Signed)
Pt notified and verbalized understanding to go to [facility] and be fasting. 

## 2014-11-16 ENCOUNTER — Other Ambulatory Visit: Payer: Self-pay | Admitting: Cardiology

## 2014-11-18 ENCOUNTER — Other Ambulatory Visit: Payer: Self-pay | Admitting: Family Medicine

## 2014-11-19 LAB — BASIC METABOLIC PANEL
BUN/Creatinine Ratio: 14 (ref 11–26)
BUN: 13 mg/dL (ref 8–27)
CALCIUM: 9.7 mg/dL (ref 8.7–10.3)
CO2: 28 mmol/L (ref 18–29)
Chloride: 96 mmol/L — ABNORMAL LOW (ref 97–108)
Creatinine, Ser: 0.95 mg/dL (ref 0.57–1.00)
GFR calc Af Amer: 63 mL/min/{1.73_m2} (ref >59)
GFR, EST NON AFRICAN AMERICAN: 55 mL/min/{1.73_m2} — AB (ref >59)
Glucose: 100 mg/dL — ABNORMAL HIGH (ref 65–99)
Potassium: 4.2 mmol/L (ref 3.5–5.2)
Sodium: 138 mmol/L (ref 134–144)

## 2014-11-19 LAB — HEPATIC FUNCTION PANEL
ALBUMIN: 4.3 g/dL (ref 3.5–4.7)
ALK PHOS: 78 IU/L (ref 39–117)
ALT: 13 IU/L (ref 0–32)
AST: 18 IU/L (ref 0–40)
BILIRUBIN, DIRECT: 0.17 mg/dL (ref 0.00–0.40)
Bilirubin Total: 0.6 mg/dL (ref 0.0–1.2)
TOTAL PROTEIN: 6.9 g/dL (ref 6.0–8.5)

## 2014-11-19 LAB — HEMOGLOBIN A1C
Est. average glucose Bld gHb Est-mCnc: 126 mg/dL
Hgb A1c MFr Bld: 6 % — ABNORMAL HIGH (ref 4.8–5.6)

## 2014-11-19 LAB — LIPID PANEL
CHOL/HDL RATIO: 3.2 ratio (ref 0.0–4.4)
CHOLESTEROL TOTAL: 161 mg/dL (ref 100–199)
HDL: 51 mg/dL (ref >39)
LDL CALC: 78 mg/dL (ref 0–99)
Triglycerides: 161 mg/dL — ABNORMAL HIGH (ref 0–149)
VLDL CHOLESTEROL CAL: 32 mg/dL (ref 5–40)

## 2014-11-24 ENCOUNTER — Encounter: Payer: Self-pay | Admitting: Family Medicine

## 2014-11-24 ENCOUNTER — Ambulatory Visit (INDEPENDENT_AMBULATORY_CARE_PROVIDER_SITE_OTHER): Payer: Medicare Other | Admitting: Family Medicine

## 2014-11-24 VITALS — BP 130/68 | Ht 64.5 in | Wt 152.4 lb

## 2014-11-24 DIAGNOSIS — K625 Hemorrhage of anus and rectum: Secondary | ICD-10-CM

## 2014-11-24 DIAGNOSIS — E785 Hyperlipidemia, unspecified: Secondary | ICD-10-CM | POA: Diagnosis not present

## 2014-11-24 DIAGNOSIS — R7303 Prediabetes: Secondary | ICD-10-CM

## 2014-11-24 DIAGNOSIS — I10 Essential (primary) hypertension: Secondary | ICD-10-CM

## 2014-11-24 DIAGNOSIS — I6523 Occlusion and stenosis of bilateral carotid arteries: Secondary | ICD-10-CM

## 2014-11-24 DIAGNOSIS — R7309 Other abnormal glucose: Secondary | ICD-10-CM

## 2014-11-24 NOTE — Progress Notes (Signed)
   Subjective:    Patient ID: Julie SageNancy S Dampier, female    DOB: [birth year], 81 y.o.   MRN: [id number]  Diabetes She presents for her follow-up diabetic visit. She has type 2 (Technically has prediabetes) diabetes mellitus. Her disease course has been stable. There are no hypoglycemic associated symptoms. Pertinent negatives for hypoglycemia include no confusion. Pertinent negatives for diabetes include no chest pain, no fatigue, no polydipsia, no polyphagia, no polyuria, no weakness and no weight loss. There are no hypoglycemic complications. There are no diabetic complications. Risk factors for coronary artery disease include dyslipidemia and family history. She is compliant with treatment all of the time. Her weight is stable. She is following a diabetic diet. Meal planning includes avoidance of concentrated sweets. She has not had a previous visit with a dietitian. She participates in exercise three times a week. There is no change in her home blood glucose trend. She does not see a podiatrist.Eye exam is current.  Hyperlipidemia This is a chronic problem. The current episode started more than 1 year ago. The problem is controlled. Recent lipid tests were reviewed and are normal. Exacerbating diseases include diabetes. She has no history of obesity. Factors aggravating her hyperlipidemia include fatty foods. Pertinent negatives include no chest pain. Current antihyperlipidemic treatment includes statins. The current treatment provides significant improvement of lipids. There are no compliance problems.  Risk factors for coronary artery disease include diabetes mellitus and family history.   she has history rectal bleeding. This is due to AVMs. Followed by gastroenterology. She has not had any reoccurrence recently. She talked about this today. I counseled her that she does have reoccurrence she may well need to see surgeon. Surgical options were discussed. Patient arrives for a follow up on sugar and  cholesterol. Patient has had recent lab work. Patient also had recent colonoscopy for rectal bleeding and had internal hemorrhoids no bleeding in 4 weeks.   Review of Systems  Constitutional: Negative for weight loss, activity change, appetite change and fatigue.  HENT: Negative for congestion.   Respiratory: Negative for cough.   Cardiovascular: Negative for chest pain.  Gastrointestinal: Negative for abdominal pain and blood in stool.  Endocrine: Negative for polydipsia, polyphagia and polyuria.  Neurological: Negative for weakness.  Psychiatric/Behavioral: Negative for confusion.       Objective:   Physical Exam  Constitutional: She appears well-nourished. No distress.  Cardiovascular: Normal rate, regular rhythm and normal heart sounds.   No murmur heard. Pulmonary/Chest: Effort normal and breath sounds normal. No respiratory distress.  Musculoskeletal: She exhibits no edema.  Lymphadenopathy:    She has no cervical adenopathy.  Neurological: She is alert. She exhibits normal muscle tone.  Psychiatric: Her behavior is normal.  Vitals reviewed.         Assessment & Plan:  1. HYPERTENSION, BENIGN Heart condition is under good control currently. She follows with cardiology. Her heart rhythm today under good control blood pressure good.  2. Hyperlipidemia Cholesterol profile looks good. HDL somewhat low but LDL looks good continue medication watch diet  3. Prediabetes A1c is gone up some. She needs to do a better job watching diet staying active. Recheck it again in approximate 5-6 months  4. Rectal bleeding Has not had any reoccurrence but we did discuss this in detail. May need surgical intervention. [place] more [place] because of family being in that area

## 2015-02-10 ENCOUNTER — Other Ambulatory Visit: Payer: Self-pay | Admitting: Cardiology

## 2015-02-10 ENCOUNTER — Telehealth: Payer: Self-pay | Admitting: Family Medicine

## 2015-02-10 ENCOUNTER — Telehealth: Payer: Self-pay | Admitting: Cardiology

## 2015-02-10 DIAGNOSIS — I10 Essential (primary) hypertension: Secondary | ICD-10-CM

## 2015-02-10 DIAGNOSIS — I480 Paroxysmal atrial fibrillation: Secondary | ICD-10-CM

## 2015-02-10 MED ORDER — HYDROCHLOROTHIAZIDE 25 MG PO TABS: 25.0000 mg | ORAL_TABLET | Freq: Every day | ORAL | Status: DC

## 2015-02-10 MED ORDER — DRONEDARONE HCL 400 MG PO TABS: ORAL_TABLET | ORAL | Status: DC

## 2015-02-10 MED ORDER — ATORVASTATIN CALCIUM 10 MG PO TABS: 10.0000 mg | ORAL_TABLET | Freq: Every day | ORAL | Status: DC

## 2015-02-10 MED ORDER — APIXABAN 5 MG PO TABS: 5.0000 mg | ORAL_TABLET | Freq: Two times a day (BID) | ORAL | Status: DC

## 2015-02-10 NOTE — Telephone Encounter (Signed)
New Message       Pt calling stating that she currently takes Multag 400 mg 2x per day  And pays [phone] for a 90 day supply and was told at the pharmacy that they have a generic that she could be prescribed that would only cost her $20. Pt wants to know if Dr. Shirlee LatchMcLean would prescribe her the generic. Please call back and advise.

## 2015-02-10 NOTE — Telephone Encounter (Signed)
BUSY 

## 2015-02-10 NOTE — Telephone Encounter (Signed)
Fluid pill last filled by cardiology-ok to refill?

## 2015-02-10 NOTE — Telephone Encounter (Signed)
I will forward to Dr McLean for review 

## 2015-02-10 NOTE — Telephone Encounter (Signed)
Pt advised, stated she would continue Multaq.

## 2015-02-10 NOTE — Telephone Encounter (Signed)
Rxs sent electronically to pharmacy. Patient notified. 

## 2015-02-10 NOTE — Telephone Encounter (Signed)
Sure follow up by sept

## 2015-02-10 NOTE — Telephone Encounter (Signed)
No generic dronedarone, are they talking about amiodarone? She could switch to amiodarone but more side effect risk.

## 2015-02-10 NOTE — Telephone Encounter (Signed)
Pt is requesting a refill on her hydrochlorothiazide (HYDRODIURIL) 25 MG tablet and atorvastatin (LIPITOR) 10 MG tablet called in to [facility] in [place].

## 2015-02-11 ENCOUNTER — Telehealth: Payer: Self-pay | Admitting: Family Medicine

## 2015-02-11 MED ORDER — HYDROCHLOROTHIAZIDE 25 MG PO TABS: 25.0000 mg | ORAL_TABLET | Freq: Every day | ORAL | Status: DC

## 2015-02-11 MED ORDER — ATORVASTATIN CALCIUM 10 MG PO TABS: 10.0000 mg | ORAL_TABLET | Freq: Every day | ORAL | Status: DC

## 2015-02-11 NOTE — Telephone Encounter (Signed)
See message from yesterday, these meds were supposed to be  Sent to a local pharmacy, please send them into [facility] an  Keep the mail order so she can stay on track with them as well.   Her Lipitor and Hydrochlorothiazide   [facility] [place]

## 2015-02-11 NOTE — Telephone Encounter (Signed)
Rx sent electronically to pharmacy. Patient notified. 

## 2015-03-01 ENCOUNTER — Telehealth: Payer: Self-pay | Admitting: Family Medicine

## 2015-03-01 NOTE — Telephone Encounter (Signed)
Pt dropped off a handicap placard to be signed. Pt wants it to be mailed to her when finished. Pt has included a self addressed envelope.

## 2015-03-04 NOTE — Telephone Encounter (Signed)
Completed, please mail

## 2015-03-10 ENCOUNTER — Ambulatory Visit (INDEPENDENT_AMBULATORY_CARE_PROVIDER_SITE_OTHER): Payer: Medicare Other | Admitting: *Deleted

## 2015-03-10 DIAGNOSIS — I4891 Unspecified atrial fibrillation: Secondary | ICD-10-CM

## 2015-03-10 LAB — CBC
HCT: 35.7 % — ABNORMAL LOW (ref 36.0–46.0)
Hemoglobin: 12.1 g/dL (ref 12.0–15.0)
MCHC: 33.9 g/dL (ref 30.0–36.0)
MCV: 89.1 fl (ref 78.0–100.0)
Platelets: 244 10*3/uL (ref 150.0–400.0)
RBC: 4.01 Mil/uL (ref 3.87–5.11)
RDW: 13.8 % (ref 11.5–15.5)
WBC: 5.3 10*3/uL (ref 4.0–10.5)

## 2015-03-10 LAB — BASIC METABOLIC PANEL
BUN: 14 mg/dL (ref 6–23)
CALCIUM: 9.5 mg/dL (ref 8.4–10.5)
CO2: 31 mEq/L (ref 19–32)
CREATININE: 1 mg/dL (ref 0.40–1.20)
Chloride: 97 mEq/L (ref 96–112)
GFR: 55.96 mL/min — ABNORMAL LOW (ref >60.00)
Glucose, Bld: 91 mg/dL (ref 70–99)
Potassium: 3.9 mEq/L (ref 3.5–5.1)
Sodium: 135 mEq/L (ref 135–145)

## 2015-03-10 NOTE — Progress Notes (Signed)
Pt was started on Eliquis for AFIB on 08/11/13 by Dr. Shirlee LatchMclean.    Reviewed patients medication list.  Pt is not currently on any combined P-gp and strong CYP3A4 inhibitors/inducers (ketoconazole, traconazole, ritonavir, carbamazepine, phenytoin, rifampin, St. John's wort).  Reviewed labs: SCr 1.00, Hgb 12.1, Hct 35.7, Weight 67.9 kg.  Dose appropriate based on dosing criteria.      A full discussion of the nature of anticoagulants has been carried out.  A benefit/risk analysis has been presented to the patient, so that they understand the justification for choosing anticoagulation with Eliquis at this time.  The need for compliance is stressed.  Pt is aware to take the medication twice daily.  Side effects of potential bleeding are discussed, including unusual colored urine or stools, coughing up blood or coffee ground emesis, nose bleeds or serious fall or head trauma.  Discussed signs and symptoms of stroke. The patient should avoid any OTC items containing aspirin or ibuprofen.  Avoid alcohol consumption.   Call if any signs of abnormal bleeding.  Discussed financial obligations and resolved any difficulty in obtaining medication.  Next lab test test in 6 months.   The cost of the medication is $1200 per year and thinks she will run into the donut hole at the end of the year. Advised to inform us before she encounters any financial issues;she verbalized understanding. She stated that at the present time she is fine with financial obligations.    Patient states that she continues to have rectal bleeding monthly and has been instructed by Dr. Shirlee LatchMclean to hold her Eliquis for 3-4 days then resume; she states this has been occuring since she started Eliquis.  The last time she had any rectal bleeding was 2 weeks ago.  Advised patient to inform us of any other bleeding issues.   Called patient and gave her lab results from today, advised to call with any other issues, and made her appointment for 6 months.

## 2015-03-17 ENCOUNTER — Telehealth: Payer: Self-pay | Admitting: Cardiology

## 2015-03-17 NOTE — Telephone Encounter (Signed)
New Message   Pt stating that she is returning call from Triage Rn but she don't know who called her.

## 2015-03-17 NOTE — Telephone Encounter (Signed)
Pt is aware of lab results.

## 2015-04-13 ENCOUNTER — Telehealth: Payer: Self-pay | Admitting: Cardiology

## 2015-04-13 DIAGNOSIS — I4891 Unspecified atrial fibrillation: Secondary | ICD-10-CM

## 2015-04-13 NOTE — Telephone Encounter (Signed)
Follow up ° ° ° ° ° °Returning Pat's call °

## 2015-04-13 NOTE — Telephone Encounter (Signed)
Left message to call back  

## 2015-04-13 NOTE — Telephone Encounter (Signed)
Spoke with pt's daughter. She reports pt had episode outlined below on Saturday. After an hour pt felt better. Blood pressure remains up and down. Today she is feeling weak with little energy. Heart rate this AM around 90 and staying 90- low 100's. Usually in 70's.  Is irregular but always irregular. Blood pressure 132/80.  Daughter would like appt with Dr. Shirlee Latch soon. Daughter states this is not emergent and she does not feel pt needs to be seen today.  Will forward to Dr. Shirlee Latch regarding appt and to see if pt would be appropriate for atrial fib clinic.

## 2015-04-13 NOTE — Telephone Encounter (Signed)
New Message       Pt's daughter calling stating that on Saturday pt Overheated and had heat exhaustion, sweating, cold and clamy, BP was low and her HR was high. Pt is now feeling weak and her HR is high. Please call back and advise.

## 2015-04-13 NOTE — Telephone Encounter (Signed)
Is she still on Multaq? If not, would restart.  I would like her to wear 24 hour holter to document afib burden, can do this now.  I am on vacation next week an do not have openings this week.  Can be seen by afib clinic this week or I will work in the week I am back from vacation (can overbook into my clinic in [place] [address] that week).

## 2015-04-13 NOTE — Telephone Encounter (Signed)
Spoke with pt's daughter.  She would like pt to be seen in Afib clinic this week.  Appt made for pt to see Rudi Coco, NP on April 14, 2015 at 9:30. I have also scheduled 24 hour holter for 11:30 on April 14, 2015.  Daughter aware if Rudi Coco, NP does not think pt needs holter we can cancel this appt.  Daughter made aware appt is at [facility] and given parking code.  Daughter confirms pt is taking Multaq.

## 2015-04-14 ENCOUNTER — Encounter (HOSPITAL_COMMUNITY): Payer: Self-pay | Admitting: Nurse Practitioner

## 2015-04-14 ENCOUNTER — Ambulatory Visit (INDEPENDENT_AMBULATORY_CARE_PROVIDER_SITE_OTHER): Payer: Medicare Other

## 2015-04-14 ENCOUNTER — Ambulatory Visit (HOSPITAL_COMMUNITY)
Admission: RE | Admit: 2015-04-14 | Discharge: 2015-04-14 | Disposition: A | Discharge status: Home / Self Care | Payer: Medicare Other | Source: Ambulatory Visit | Attending: Nurse Practitioner | Admitting: Nurse Practitioner

## 2015-04-14 VITALS — BP 132/80 | HR 75 | Ht 64.0 in | Wt 148.2 lb

## 2015-04-14 DIAGNOSIS — I4891 Unspecified atrial fibrillation: Secondary | ICD-10-CM

## 2015-04-14 DIAGNOSIS — I480 Paroxysmal atrial fibrillation: Secondary | ICD-10-CM | POA: Insufficient documentation

## 2015-04-14 LAB — BASIC METABOLIC PANEL
ANION GAP: 8 (ref 5–15)
BUN: 14 mg/dL (ref 6–20)
CALCIUM: 9.6 mg/dL (ref 8.9–10.3)
CO2: 31 mmol/L (ref 22–32)
Chloride: 95 mmol/L — ABNORMAL LOW (ref 101–111)
Creatinine, Ser: 0.97 mg/dL (ref 0.44–1.00)
GFR calc Af Amer: >60 mL/min (ref >60)
GFR calc non Af Amer: 52 mL/min — ABNORMAL LOW (ref >60)
Glucose, Bld: 100 mg/dL — ABNORMAL HIGH (ref 65–99)
POTASSIUM: 4 mmol/L (ref 3.5–5.1)
Sodium: 134 mmol/L — ABNORMAL LOW (ref 135–145)

## 2015-04-14 LAB — CBC
HCT: 37.8 % (ref 36.0–46.0)
Hemoglobin: 13 g/dL (ref 12.0–15.0)
MCH: 30.5 pg (ref 26.0–34.0)
MCHC: 34.4 g/dL (ref 30.0–36.0)
MCV: 88.7 fL (ref 78.0–100.0)
Platelets: 232 10*3/uL (ref 150–400)
RBC: 4.26 MIL/uL (ref 3.87–5.11)
RDW: 13.3 % (ref 11.5–15.5)
WBC: 6.3 10*3/uL (ref 4.0–10.5)

## 2015-04-14 LAB — TSH: TSH: 1.889 u[IU]/mL (ref 0.350–4.500)

## 2015-04-14 MED ORDER — DILTIAZEM HCL 30 MG PO TABS: ORAL_TABLET | ORAL | Status: DC

## 2015-04-14 NOTE — Progress Notes (Signed)
Patient ID: Julie Swanson, female   DOB: 02/14/1928, 81 y.o.   MRN: [id number]     Primary Care Physician: Lilyan Punt, MD Referring Physician: Dr. Rockey Situ Julie Swanson is a 81 y.o. female with a h/o PAF that has been on multaq and apixaban and has been maintaining sinus rhythm. She was helping her daughter with a yard sale this past Saturday and had worked very hard on Friday to help get things organized. She was in the heat mid morning when she suddenly became weak, limp and diaphoretic. Her daughter took her inside and cooled her off and got her something to drink. BP cuff showed heart rate of 160 bpm which eventually slowed to less than 100 bpm over a 30 min span. She started feeling stronger and today other than being a little tired, she is at her baseline. Plans are in place to receive a 24 hour monitor at 11:30 this am. Ekg today shows afib rate controlled.  She does have intermittent rectal bleeding, present since the use of DOAC,  worked up with normal colonoscopy and thought to be secondary to internal hemorrhoids. This happens every 4-6 weeks and she will stop her blood thinners for 3 days and resume on the fourth. This just occurred the latter part of July but she has been back on apixaban since 7-31.  She was toll that if this became more of a problem, she could be considered for surgery.  Today, she denies symptoms of palpitations, chest pain, shortness of breath, orthopnea, PND, lower extremity edema, dizziness, presyncope, syncope, or neurologic sequela. Mild fatigue. The patient is tolerating medications without difficulties and is otherwise without complaint today.   Past Medical History  Diagnosis Date  . CAD (coronary artery disease)   . MVP (mitral valve prolapse)   . Palpitations   . HTN (hypertension)   . HLD (hyperlipidemia)   . Hyperkalemia   . Chest pain   . Osteoporosis   . Macular degeneration   . Arthritis   . Atrial fibrillation    Past Surgical History    Procedure Laterality Date  . Hysterectomy - unknown type    . Abdominal hysterectomy    . Cystectomy      BREAST  . Cataract extraction      BOTH EYES  . Colonoscopy    . Colonoscopy N/A 08/14/2014    Procedure: COLONOSCOPY;  Surgeon: Malissa Hippo, MD;  Location: [facility];  Service: Endoscopy;  Laterality: N/A;  225    Current Outpatient Prescriptions  Medication Sig Dispense Refill  . acetaminophen (TYLENOL) 500 MG tablet Take 500 mg by mouth 2 (two) times daily as needed for moderate pain.    Marland Kitchen apixaban (ELIQUIS) 5 MG TABS tablet Take 1 tablet (5 mg total) by mouth 2 (two) times daily. 180 tablet 2  . atorvastatin (LIPITOR) 10 MG tablet Take 1 tablet (10 mg total) by mouth daily. 30 tablet 0  . calcium-vitamin D (OSCAL WITH D) 500-200 MG-UNIT per tablet Take 1 tablet by mouth daily with breakfast.     . cycloSPORINE (RESTASIS) 0.05 % ophthalmic emulsion Place 1 drop into both eyes 2 (two) times daily.     Marland Kitchen dronedarone (MULTAQ) 400 MG tablet TAKE 1 TABLET TWICE DAILY  WITH  A  MEAL 180 tablet 1  . hydrochlorothiazide (HYDRODIURIL) 25 MG tablet Take 1 tablet (25 mg total) by mouth daily. 30 tablet 0  . Lutein 20 MG CAPS Take 1 capsule by  mouth daily.     . Multiple Vitamins-Minerals (ICAPS) CAPS Take 2 capsules by mouth daily.    . propranolol (INDERAL) 10 MG tablet TAKE ONE TABLET BY MOUTH TWICE DAILY 60 tablet 4  . diltiazem (CARDIZEM) 30 MG tablet Take 1 tablet every 4 hours AS NEEDED for afib HR>100 and BP>100 30 tablet 1  . Wheat Dextrin (BENEFIBER DRINK MIX PO) Take by mouth.     No current facility-administered medications for this encounter.    Allergies  Allergen Reactions  . Valtrex [Valacyclovir Hcl] Palpitations    Chest pain.  Marland Kitchen Antihistamines, Chlorpheniramine-Type Other (See Comments)    Patient states it makes her feel like she's in "la la [place]"  . Evista [Raloxifene] Other (See Comments)    Causes knots  . Feldene [Piroxicam] Other (See Comments)     unknown  . Toprol Xl [Metoprolol Tartrate]     Bad dreams  . Tropicamide     Dizziness, lethargic, non-responsive    History   Social History  . Marital Status: Widowed    Spouse Name: N/A  . Number of Children: N/A  . Years of Education: N/A   Occupational History  . Not on file.   Social History Main Topics  . Smoking status: Never Smoker   . Smokeless tobacco: Never Used  . Alcohol Use: No  . Drug Use: No  . Sexual Activity: Not on file   Other Topics Concern  . Not on file   Social History Narrative    Family History  Problem Relation Age of Onset  . Stroke    . Heart attack    . Heart disease Mother   . Stroke Mother     ane melanoma  . Heart disease Father   . Bladder Cancer Father   . Cancer Brother   . CAD Brother     ALL  . CAD Sister     X2    ROS- All systems are reviewed and negative except as per the HPI above  Physical Exam: Filed Vitals:   04/14/15 0952  BP: 132/80  Pulse: 75  Height:  (1.626 m)  Weight: 148 lb 3.2 oz (67.223 kg)    GEN- The patient is well appearing, alert and oriented x 3 today.   Head- normocephalic, atraumatic Eyes-  Sclera clear, conjunctiva pink Ears- hearing intact Oropharynx- clear Neck- supple, no JVP Lymph- no cervical lymphadenopathy Lungs- Clear to ausculation bilaterally, normal work of breathing Heart- Regular rate and rhythm, no murmurs, rubs or gallops, PMI not laterally displaced GI- soft, NT, ND, + BS Extremities- no clubbing, cyanosis, or edema MS- no significant deformity or atrophy Skin- no rash or lesion Psych- euthymic mood, full affect Neuro- strength and sensation are intact  EKG- EKG today shows afib at 75 bpm Epic records reviewed  Assessment and Plan: 1.PAF Episode over the weekend withRVR and EKG showing rate controlled today. 24 hour monitor is pending If afib is persistent on multaq, may have to consider another antiarrythmic. Continue apixaban. Will give pt a pill in  pocket cardizem 30 mg to use one every 4-6 hours for rate control in case of another episode with RVR. Pt did not tolerated daily cardizem in past due to swelling and had bad dreams with toprol. Repeat bmet tsh cbc today  F/u in afib clinic as needed  F/u Dr. Shirlee Latch as scheduled  [facility]. [facility] [address] [place], [place] [address] [phone]

## 2015-04-14 NOTE — Patient Instructions (Signed)
Your physician has recommended you make the following change in your medication:  1)Cardizem  -- take 1 tablet every 4 hours AS NEEDED for AFib Heart rate >100 and blood pressure >100

## 2015-04-19 ENCOUNTER — Telehealth: Payer: Self-pay | Admitting: Nurse Practitioner

## 2015-04-19 NOTE — Telephone Encounter (Signed)
Called patient to report holter results.  Patient states she is feeling pretty well, states she feels better than she did when she saw Rudi Coco, NP in a fib clinic on 8/3.  She says that prior to the episode for which she called about on 8/2 she had worked outside for many hours and overexerted herself.  States she does not feel anything in her chest, was unaware that she had the irregular rhythm.  I reviewed Dr. Alford Highland advice and explained DCCV to her.  She is uncertain as to whether she wants to schedule procedure at this time.  She states she would prefer to wait and see Dr. Shirlee Latch in the office and to continue on Multaq; states Rudi Coco also told her there are other medications she can try.  I advised her next available appointment is 8/22 with Dr. Shirlee Latch.  She states she would still prefer to see him prior to scheduling the cardioversion.  I advised her to call back this week or next if her symptoms worsen and advised that DCCV can be done any weekday if needed prior to Dr. Alford Highland return from vacation.  She thanked me for the call.

## 2015-04-19 NOTE — Telephone Encounter (Signed)
-----   Message from Laurey Morale, MD sent at 04/16/2015 10:27 PM EDT ----- Persistent atrial fibrillation.  She does not tolerate well and will need a cardioversion if she remains in atrial fibrillation.  Would have her continue apixaban, do not miss any doses.  Arrange for her to have DCCV with me the week I am back from vacation as long as she remains stable.  If decompensates, would do it this week.

## 2015-04-20 NOTE — Telephone Encounter (Signed)
Have her come by at some point this week or next and get an ECG again to see if she has gone back into NSR since she feels better.  When I see her, I will likely change her anti-arrhythmic.

## 2015-04-21 NOTE — Telephone Encounter (Signed)
Patient states she is feeling fine.   Scheduled EKG on Monday 8/15. Patient verbalized understanding and agreeable to plan.

## 2015-04-23 ENCOUNTER — Other Ambulatory Visit: Payer: Self-pay | Admitting: Family Medicine

## 2015-04-26 ENCOUNTER — Ambulatory Visit (INDEPENDENT_AMBULATORY_CARE_PROVIDER_SITE_OTHER): Payer: Medicare Other | Admitting: *Deleted

## 2015-04-26 VITALS — BP 140/80 | HR 91 | Ht 64.0 in | Wt 149.2 lb

## 2015-04-26 DIAGNOSIS — I480 Paroxysmal atrial fibrillation: Secondary | ICD-10-CM | POA: Diagnosis not present

## 2015-04-26 NOTE — Progress Notes (Signed)
She will need to stop Multaq, can do this when I see her.    She will need a different antiarrhythmic followed by cardioversion if she stays in atrial fibrillation.  She could get dofetilide but would need 3 days in hospital to start.  Flecainide would be an option as well but would have to have cardiolite done.  Amiodarone option as well but more side effects.  As long as she is stable, can discuss 8/22.

## 2015-04-26 NOTE — Progress Notes (Signed)
Copied from Rudi Coco 04/14/15 office note:  PAF Episode over the weekend withRVR and EKG showing rate controlled today. 24 hour monitor is pending If afib is persistent on multaq, may have to consider another antiarrythmic. Continue apixaban. Will give pt a pill in pocket cardizem 30 mg to use one every 4-6 hours for rate control in case of another episode with RVR. Pt did not tolerated daily cardizem in past due to swelling and had bad dreams with toprol. Repeat bmet tsh cbc today  Dr Alford Highland response to 04/19/15 phone note-see phone note for more details.       Laurey Morale, MD at 04/20/2015 11:13 PM     Status: Signed        Have her come by at some point this week or next and get an ECG again to see if she has gone back into NSR since she feels better. When I see her, I will likely change her anti-arrhythmic.        04/26/15--pt to office for an EKG. EKG done and read by Dr Nelson-Atrial flutter with variable block, heart rate 91.  Pt aware of results of EKG done today.  Pt states she is feeling fine, no episodes of fast heart rate, fluttering since office visit with Rudi Coco, NP 04/14/15. Pt verbalized understanding per Arne Cleveland 04/14/15 note to use cardizem  every 4-6 hours for rate control if HR >100 and BP>100.  Pt prefers to discuss cardioversion with Dr Shirlee Latch at office visit 05/03/15, she prefers not to schedule cardioversion before that office visit.  Pt is checking her heart rate and BP at home, heart rate in the 80 range when she checks it.  Pt aware to call back if she has symptoms before office visit with Dr Shirlee Latch 05/03/15.

## 2015-05-03 ENCOUNTER — Ambulatory Visit (INDEPENDENT_AMBULATORY_CARE_PROVIDER_SITE_OTHER): Payer: Medicare Other | Admitting: Cardiology

## 2015-05-03 ENCOUNTER — Encounter: Payer: Self-pay | Admitting: Cardiology

## 2015-05-03 ENCOUNTER — Ambulatory Visit: Payer: 59 | Admitting: Physician Assistant

## 2015-05-03 ENCOUNTER — Encounter: Payer: Self-pay | Admitting: *Deleted

## 2015-05-03 VITALS — BP 132/70 | HR 87 | Ht 64.0 in | Wt 147.8 lb

## 2015-05-03 DIAGNOSIS — I480 Paroxysmal atrial fibrillation: Secondary | ICD-10-CM

## 2015-05-03 DIAGNOSIS — E785 Hyperlipidemia, unspecified: Secondary | ICD-10-CM | POA: Diagnosis not present

## 2015-05-03 DIAGNOSIS — I6523 Occlusion and stenosis of bilateral carotid arteries: Secondary | ICD-10-CM

## 2015-05-03 MED ORDER — PROPRANOLOL HCL 20 MG PO TABS: 20.0000 mg | ORAL_TABLET | Freq: Two times a day (BID) | ORAL | Status: DC

## 2015-05-03 NOTE — Patient Instructions (Addendum)
Medication Instructions:  Increase Inderal (propranolol) to  two times a day  Labwork: None today  Testing/Procedures: Your physician has recommended that you have a Cardioversion (DCCV). Electrical Cardioversion uses a jolt of electricity to your heart either through paddles or wired patches attached to your chest. This is a controlled, usually prescheduled, procedure. Defibrillation is done under light anesthesia in the hospital, and you usually go home the day of the procedure. This is done to get your heart back into a normal rhythm. You are not awake for the procedure. Please see the instruction sheet given to you today. Thursday September 1 ,2016    Follow-Up: Your physician recommends that you schedule a follow-up appointment in: 3-4 weeks with Dr Shirlee Latch.

## 2015-05-04 NOTE — Progress Notes (Signed)
Patient ID: Julie Swanson, female   DOB: 09/13/1927, 81 y.o.   MRN: [id number] PCP: Dr. Lilyan Punt  81 yo presents for followup of paroxysmal atrial fibrillation.  She had been having runs of rapid tachypalpitations when I saw her initially.  She was quite symptomatic with episodes.  Event monitor showed runs of atrial fibrillation with RVR.  I started her on Multaq for rhythm control given her significant symptoms and on apixaban.  She did not tolerate diltiazem due to ankle swelling and was switched over to Coreg for rate control.  She did not tolerate Coreg and is back on propranolol, which she does ok with.  Echo in 11/14 showed EF 60-65% with mild MR. No exertional chest pain or dyspnea. No lightheadedness.  She has had occasional rectal bleeding recently that seems to have been relatively mild.  She saw a gastroenterologist and had c-scope showing hemorrhoids that were thought to be the etiology of the bleeding.  Currently, no bleeding.  Last episode was at the end of July, and she held her Eliquis for 3 days ending on 7/31.    About 2 wks ago, she helped out at a yard sale and got very hot and tired.  She felt herself go into atrial fibrillation (tachypalpitations).  Rate is in the 90s-100s. She denies dyspnea but reports signficantly increased fatigue.  She does not feel good.   Holter in 8/16 showed persistent atrial fibrillation.   ECG: atrial fibrillation, T wave inversions inferiorly and anterolaterally.   Labs (10/14): LDL 90, HDL 41, K 4.1, creatinine 0.9 Labs (4/15): LDL 74, HDL 40, K 4.1, creatinine 4.09 Labs (10/15): LDL 73, HDL 38 Labs (12/15): K 4, creatinine 0.9, HCT 36 Labs (8/16): K 4, creatinine 0.97, HCT 37.8, TSH normal  PMH: 1. LHC (8/10) with minimal nonobstructive disease.  ETT (10/13) with 2'53" exercise, nonspecific ECG changes with no evidence for ischemia.  2. Mitral valve prolapse: Echo (11/14) with EF 60-65%, mild MR.  3. Atrial fibrillation:  Event monitor  (10/13) with PACs only.  Event monitor (11/14) showed atrial fibrillation with RVR. She does not tolerate metoprolol (nightmares).  She had ankle swelling with diltiazem. Possible fatigue/diarrhea with Coreg. Holter (8/16) with persistent atrial fibrillation.  4. HTN 5. Hyperlipidemia 6. Hysterectomy. 7. Carotid disease: Carotid dopplers (1/15) with prominent plaque, mild stenosis.  8. Internal hemorrhoids with episodic rectal bleeding.  9. Macular degeneration.   SH: Widow, lives in [place], nonsmoker.  2 daughters.   FH: Mother with CVA, CAD.  Father with CAD.   ROS: All systems reviewed and negative except as per HPI.   Current Outpatient Prescriptions  Medication Sig Dispense Refill  . acetaminophen (TYLENOL) 500 MG tablet Take 500 mg by mouth 2 (two) times daily as needed for moderate pain.    Marland Kitchen apixaban (ELIQUIS) 5 MG TABS tablet Take 1 tablet (5 mg total) by mouth 2 (two) times daily. 180 tablet 2  . atorvastatin (LIPITOR) 10 MG tablet TAKE ONE TABLET BY MOUTH ONCE DAILY 30 tablet 0  . calcium-vitamin D (OSCAL WITH D) 500-200 MG-UNIT per tablet Take 1 tablet by mouth daily with breakfast.     . cycloSPORINE (RESTASIS) 0.05 % ophthalmic emulsion Place 1 drop into both eyes daily.     Marland Kitchen diltiazem (CARDIZEM) 30 MG tablet Take 1 tablet every 4 hours AS NEEDED for afib HR>100 and BP>100 30 tablet 1  . dronedarone (MULTAQ) 400 MG tablet TAKE 1 TABLET TWICE DAILY  WITH  A  MEAL 180 tablet 1  . hydrochlorothiazide (HYDRODIURIL) 25 MG tablet TAKE ONE TABLET BY MOUTH ONCE DAILY 30 tablet 0  . Lutein 20 MG CAPS Take 1 capsule by mouth daily.     . Multiple Vitamins-Minerals (ICAPS) CAPS Take 2 capsules by mouth daily.    . propranolol (INDERAL) 20 MG tablet Take 1 tablet (20 mg total) by mouth 2 (two) times daily. 60 tablet 6   No current facility-administered medications for this visit.    BP 132/70 mmHg  Pulse 87  Ht  (1.626 m)  Wt 147 lb 12.8 oz (67.042 kg)  BMI 25.36 kg/m2   SpO2 98% General: NAD Neck: No JVD, no thyromegaly or thyroid nodule.  Lungs: Clear to auscultation bilaterally with normal respiratory effort. CV: Nondisplaced PMI.  Heart irregular S1/S2, no S3/S4, no murmur.  No edema.  Right carotid bruit.  Normal pedal pulses.  Abdomen: Soft, nontender, no hepatosplenomegaly, no distention.  Skin: Intact without lesions or rashes.  Neurologic: Alert and oriented x 3.  Psych: Normal affect. Extremities: No clubbing or cyanosis.   Assessment/Plan: 1. Atrial fibrillation:  Now persistent.  She did well in NSR on Multaq for a long time, but has gone into atrial fibrillation persistently for a couple of weeks.  She tolerates atrial fibrillation poorly with significantly increased fatigue.   - Continue Multaq and Eliquis. She has been taking Eliquis without missing doses since 7/31; I will plan a DCCV after 4 weeks continuous Eliquis.  - I will continue Multaq for now.  If atrial fibrillation recurs quickly after DCCV, I will switch anti-arrhythmics to dofetilide or amiodarone.  - Increase propranolol to 20 mg bid to help with rate control.  She has not tolerated diltiazem CD.   - She continues to have occasional rectal bleeding that has been thought to be hemorrhoidal (had colonoscopy by GI). Continue apixaban, recent CBC was ok.  2. HTN:  BP controlled.   3. Hyperlipidemia: Good lipids in 10/15.  4. Carotid bruit: Mild stenosis only.    Marca Ancona 05/04/2015

## 2015-05-12 NOTE — Anesthesia Preprocedure Evaluation (Addendum)
Anesthesia Evaluation  Patient identified by MRN, date of birth, ID band Patient awake    Reviewed: Allergy & Precautions, NPO status , Patient's Chart, lab work & pertinent test results  Airway Mallampati: II   Neck ROM: Full    Dental  (+) Dental Advisory Given, Teeth Intact   Pulmonary neg pulmonary ROS,  breath sounds clear to auscultation        Cardiovascular hypertension, Pt. on medications + dysrhythmias Atrial Fibrillation Valvular problems/murmurs: MVP mild. MVP Rhythm:Irregular  ECHO 2014 EF 60%   Neuro/Psych Carotids 1-39% stenosis bilat    GI/Hepatic Neg liver ROS,   Endo/Other  negative endocrine ROS  Renal/GU negative Renal ROS     Musculoskeletal   Abdominal (+)  Abdomen: soft.    Peds  Hematology   Anesthesia Other Findings   Reproductive/Obstetrics                            Anesthesia Physical Anesthesia Plan  ASA: III  Anesthesia Plan: MAC   Post-op Pain Management:    Induction: Intravenous  Airway Management Planned: Nasal Cannula and Natural Airway  Additional Equipment:   Intra-op Plan:   Post-operative Plan:   Informed Consent: I have reviewed the patients History and Physical, chart, labs and discussed the procedure including the risks, benefits and alternatives for the proposed anesthesia with the patient or authorized representative who has indicated his/her understanding and acceptance.     Plan Discussed with:   Anesthesia Plan Comments:         Anesthesia Quick Evaluation

## 2015-05-13 ENCOUNTER — Ambulatory Visit (HOSPITAL_COMMUNITY): Payer: Medicare Other | Admitting: Anesthesiology

## 2015-05-13 ENCOUNTER — Encounter (HOSPITAL_COMMUNITY)
Admission: RE | Disposition: A | Discharge status: Home / Self Care | Payer: Self-pay | Source: Ambulatory Visit | Attending: Cardiology

## 2015-05-13 ENCOUNTER — Encounter (HOSPITAL_COMMUNITY): Payer: Self-pay

## 2015-05-13 ENCOUNTER — Ambulatory Visit (HOSPITAL_COMMUNITY)
Admission: RE | Admit: 2015-05-13 | Discharge: 2015-05-13 | Disposition: A | Discharge status: Home / Self Care | Payer: Medicare Other | Source: Ambulatory Visit | Attending: Cardiology | Admitting: Cardiology

## 2015-05-13 ENCOUNTER — Other Ambulatory Visit (HOSPITAL_COMMUNITY): Payer: Self-pay | Admitting: Cardiology

## 2015-05-13 DIAGNOSIS — I481 Persistent atrial fibrillation: Secondary | ICD-10-CM | POA: Insufficient documentation

## 2015-05-13 DIAGNOSIS — I2510 Atherosclerotic heart disease of native coronary artery without angina pectoris: Secondary | ICD-10-CM | POA: Insufficient documentation

## 2015-05-13 DIAGNOSIS — E785 Hyperlipidemia, unspecified: Secondary | ICD-10-CM | POA: Insufficient documentation

## 2015-05-13 DIAGNOSIS — Z7901 Long term (current) use of anticoagulants: Secondary | ICD-10-CM | POA: Insufficient documentation

## 2015-05-13 DIAGNOSIS — I10 Essential (primary) hypertension: Secondary | ICD-10-CM | POA: Insufficient documentation

## 2015-05-13 DIAGNOSIS — I4891 Unspecified atrial fibrillation: Secondary | ICD-10-CM | POA: Diagnosis present

## 2015-05-13 DIAGNOSIS — H3530 Unspecified macular degeneration: Secondary | ICD-10-CM | POA: Insufficient documentation

## 2015-05-13 DIAGNOSIS — Z79899 Other long term (current) drug therapy: Secondary | ICD-10-CM | POA: Insufficient documentation

## 2015-05-13 HISTORY — DX: Adverse effect of unspecified anesthetic, initial encounter: T41.45XA

## 2015-05-13 HISTORY — PX: CARDIOVERSION: SHX1299

## 2015-05-13 HISTORY — DX: Other complications of anesthesia, initial encounter: T88.59XA

## 2015-05-13 LAB — POCT I-STAT 4, (NA,K, GLUC, HGB,HCT)
Glucose, Bld: 101 mg/dL — ABNORMAL HIGH (ref 65–99)
HEMATOCRIT: 39 % (ref 36.0–46.0)
HEMOGLOBIN: 13.3 g/dL (ref 12.0–15.0)
Potassium: 4 mmol/L (ref 3.5–5.1)
SODIUM: 141 mmol/L (ref 135–145)

## 2015-05-13 SURGERY — CARDIOVERSION
Anesthesia: Monitor Anesthesia Care

## 2015-05-13 MED ORDER — LIDOCAINE HCL (CARDIAC) 20 MG/ML IV SOLN
INTRAVENOUS | Status: DC | PRN
  Administered 2015-05-13: 30 mg via INTRAVENOUS

## 2015-05-13 MED ORDER — SODIUM CHLORIDE 0.9 % IV SOLN
INTRAVENOUS | Status: DC | PRN
  Administered 2015-05-13: 11:00:00 via INTRAVENOUS

## 2015-05-13 MED ORDER — PROPOFOL 10 MG/ML IV BOLUS
INTRAVENOUS | Status: DC | PRN
  Administered 2015-05-13: 40 mg via INTRAVENOUS

## 2015-05-13 NOTE — Anesthesia Postprocedure Evaluation (Signed)
  Anesthesia Post-op Note  Patient: Julie Swanson  Procedure(s) Performed: Procedure(s): CARDIOVERSION (N/A)  Patient Location: PACU  Anesthesia Type:MAC  Level of Consciousness: awake  Airway and Oxygen Therapy: Patient Spontanous Breathing  Post-op Pain: none  Post-op Assessment: Post-op Vital signs reviewed, Patient's Cardiovascular Status Stable, Respiratory Function Stable, Patent Airway and No signs of Nausea or vomiting              Post-op Vital Signs: Reviewed and stable  Last Vitals:  Filed Vitals:   05/13/15 0959  BP: 153/81  Pulse: 85  Resp: 19    Complications: No apparent anesthesia complications

## 2015-05-13 NOTE — H&P (View-Only) (Signed)
Patient ID: Julie Swanson, female   DOB: 09/13/1927, 81 y.o.   MRN: [id number] PCP: Dr. Lilyan Punt  81 yo presents for followup of paroxysmal atrial fibrillation.  She had been having runs of rapid tachypalpitations when I saw her initially.  She was quite symptomatic with episodes.  Event monitor showed runs of atrial fibrillation with RVR.  I started her on Multaq for rhythm control given her significant symptoms and on apixaban.  She did not tolerate diltiazem due to ankle swelling and was switched over to Coreg for rate control.  She did not tolerate Coreg and is back on propranolol, which she does ok with.  Echo in 11/14 showed EF 60-65% with mild MR. No exertional chest pain or dyspnea. No lightheadedness.  She has had occasional rectal bleeding recently that seems to have been relatively mild.  She saw a gastroenterologist and had c-scope showing hemorrhoids that were thought to be the etiology of the bleeding.  Currently, no bleeding.  Last episode was at the end of July, and she held her Eliquis for 3 days ending on 7/31.    About 2 wks ago, she helped out at a yard sale and got very hot and tired.  She felt herself go into atrial fibrillation (tachypalpitations).  Rate is in the 90s-100s. She denies dyspnea but reports signficantly increased fatigue.  She does not feel good.   Holter in 8/16 showed persistent atrial fibrillation.   ECG: atrial fibrillation, T wave inversions inferiorly and anterolaterally.   Labs (10/14): LDL 90, HDL 41, K 4.1, creatinine 0.9 Labs (4/15): LDL 74, HDL 40, K 4.1, creatinine 4.09 Labs (10/15): LDL 73, HDL 38 Labs (12/15): K 4, creatinine 0.9, HCT 36 Labs (8/16): K 4, creatinine 0.97, HCT 37.8, TSH normal  PMH: 1. LHC (8/10) with minimal nonobstructive disease.  ETT (10/13) with 2'53" exercise, nonspecific ECG changes with no evidence for ischemia.  2. Mitral valve prolapse: Echo (11/14) with EF 60-65%, mild MR.  3. Atrial fibrillation:  Event monitor  (10/13) with PACs only.  Event monitor (11/14) showed atrial fibrillation with RVR. She does not tolerate metoprolol (nightmares).  She had ankle swelling with diltiazem. Possible fatigue/diarrhea with Coreg. Holter (8/16) with persistent atrial fibrillation.  4. HTN 5. Hyperlipidemia 6. Hysterectomy. 7. Carotid disease: Carotid dopplers (1/15) with prominent plaque, mild stenosis.  8. Internal hemorrhoids with episodic rectal bleeding.  9. Macular degeneration.   SH: Widow, lives in [place], nonsmoker.  2 daughters.   FH: Mother with CVA, CAD.  Father with CAD.   ROS: All systems reviewed and negative except as per HPI.   Current Outpatient Prescriptions  Medication Sig Dispense Refill  . acetaminophen (TYLENOL) 500 MG tablet Take 500 mg by mouth 2 (two) times daily as needed for moderate pain.    Marland Kitchen apixaban (ELIQUIS) 5 MG TABS tablet Take 1 tablet (5 mg total) by mouth 2 (two) times daily. 180 tablet 2  . atorvastatin (LIPITOR) 10 MG tablet TAKE ONE TABLET BY MOUTH ONCE DAILY 30 tablet 0  . calcium-vitamin D (OSCAL WITH D) 500-200 MG-UNIT per tablet Take 1 tablet by mouth daily with breakfast.     . cycloSPORINE (RESTASIS) 0.05 % ophthalmic emulsion Place 1 drop into both eyes daily.     Marland Kitchen diltiazem (CARDIZEM) 30 MG tablet Take 1 tablet every 4 hours AS NEEDED for afib HR>100 and BP>100 30 tablet 1  . dronedarone (MULTAQ) 400 MG tablet TAKE 1 TABLET TWICE DAILY  WITH  A  MEAL 180 tablet 1  . hydrochlorothiazide (HYDRODIURIL) 25 MG tablet TAKE ONE TABLET BY MOUTH ONCE DAILY 30 tablet 0  . Lutein 20 MG CAPS Take 1 capsule by mouth daily.     . Multiple Vitamins-Minerals (ICAPS) CAPS Take 2 capsules by mouth daily.    . propranolol (INDERAL) 20 MG tablet Take 1 tablet (20 mg total) by mouth 2 (two) times daily. 60 tablet 6   No current facility-administered medications for this visit.    BP 132/70 mmHg  Pulse 87  Ht  (1.626 m)  Wt 147 lb 12.8 oz (67.042 kg)  BMI 25.36 kg/m2   SpO2 98% General: NAD Neck: No JVD, no thyromegaly or thyroid nodule.  Lungs: Clear to auscultation bilaterally with normal respiratory effort. CV: Nondisplaced PMI.  Heart irregular S1/S2, no S3/S4, no murmur.  No edema.  Right carotid bruit.  Normal pedal pulses.  Abdomen: Soft, nontender, no hepatosplenomegaly, no distention.  Skin: Intact without lesions or rashes.  Neurologic: Alert and oriented x 3.  Psych: Normal affect. Extremities: No clubbing or cyanosis.   Assessment/Plan: 1. Atrial fibrillation:  Now persistent.  She did well in NSR on Multaq for a long time, but has gone into atrial fibrillation persistently for a couple of weeks.  She tolerates atrial fibrillation poorly with significantly increased fatigue.   - Continue Multaq and Eliquis. She has been taking Eliquis without missing doses since 7/31; I will plan a DCCV after 4 weeks continuous Eliquis.  - I will continue Multaq for now.  If atrial fibrillation recurs quickly after DCCV, I will switch anti-arrhythmics to dofetilide or amiodarone.  - Increase propranolol to 20 mg bid to help with rate control.  She has not tolerated diltiazem CD.   - She continues to have occasional rectal bleeding that has been thought to be hemorrhoidal (had colonoscopy by GI). Continue apixaban, recent CBC was ok.  2. HTN:  BP controlled.   3. Hyperlipidemia: Good lipids in 10/15.  4. Carotid bruit: Mild stenosis only.    Marca Ancona 05/04/2015

## 2015-05-13 NOTE — Transfer of Care (Signed)
Immediate Anesthesia Transfer of Care Note  Patient: Julie Swanson  Procedure(s) Performed: Procedure(s): CARDIOVERSION (N/A)  Patient Location: Endoscopy Unit  Anesthesia Type:MAC  Level of Consciousness: sedated  Airway & Oxygen Therapy: Patient Spontanous Breathing and Patient connected to nasal cannula oxygen  Post-op Assessment: Report given to RN and Post -op Vital signs reviewed and stable  Post vital signs: Reviewed and stable  Last Vitals:  Filed Vitals:   05/13/15 0959  BP: 153/81  Pulse: 85  Resp: 19    Complications: No apparent anesthesia complications

## 2015-05-13 NOTE — Discharge Instructions (Signed)
Monitored Anesthesia Care °Monitored anesthesia care is an anesthesia service for a medical procedure. Anesthesia is the loss of the ability to feel pain. It is produced by medicines called anesthetics. It may affect a small area of your body (local anesthesia), a large area of your body (regional anesthesia), or your entire body (general anesthesia). The need for monitored anesthesia care depends your procedure, your condition, and the potential need for regional or general anesthesia. It is often provided during procedures where:  °· General anesthesia may be needed if there are complications. This is because you need special care when you are under general anesthesia.   °· You will be under local or regional anesthesia. This is so that you are able to have higher levels of anesthesia if needed.   °· You will receive calming medicines (sedatives). This is especially the case if sedatives are given to put you in a semi-conscious state of relaxation (deep sedation). This is because the amount of sedative needed to produce this state can be hard to predict. Too much of a sedative can produce general anesthesia. °Monitored anesthesia care is performed by one or more health care providers who have special training in all types of anesthesia. You will need to meet with these health care providers before your procedure. During this meeting, they will ask you about your medical history. They will also give you instructions to follow. (For example, you will need to stop eating and drinking before your procedure. You may also need to stop or change medicines you are taking.) During your procedure, your health care providers will stay with you. They will:  °· Watch your condition. This includes watching your blood pressure, breathing, and level of pain.   °· Diagnose and treat problems that occur.   °· Give medicines if they are needed. These may include calming medicines (sedatives) and anesthetics.   °· Make sure you are  comfortable.   °Having monitored anesthesia care does not necessarily mean that you will be under anesthesia. It does mean that your health care providers will be able to manage anesthesia if you need it or if it occurs. It also means that you will be able to have a different type of anesthesia than you are having if you need it. When your procedure is complete, your health care providers will continue to watch your condition. They will make sure any medicines wear off before you are allowed to go home.  °Document Released: 05/24/2005 Document Revised: 01/12/2014 Document Reviewed: 10/09/2012 °ExitCare® Patient Information ©2015 [employer]. This information is not intended to replace advice given to you by your health care provider. Make sure you discuss any questions you have with your health care provider. °Electrical Cardioversion, Care After °Refer to this sheet in the next few weeks. These instructions provide you with information on caring for yourself after your procedure. Your health care provider may also give you more specific instructions. Your treatment has been planned according to current medical practices, but problems sometimes occur. Call your health care provider if you have any problems or questions after your procedure. °WHAT TO EXPECT AFTER THE PROCEDURE °After your procedure, it is typical to have the following sensations: °· Some redness on the skin where the shocks were delivered. If this is tender, a sunburn lotion or hydrocortisone cream may help. °· Possible return of an abnormal heart rhythm within hours or days after the procedure. °HOME CARE INSTRUCTIONS °· Take medicines only as directed by your health care provider. Be sure you understand how   and when to take your medicine. °· Learn how to feel your pulse and check it often. °· Limit your activity for 48 hours after the procedure or as directed by your health care provider. °· Avoid or minimize caffeine and other stimulants as  directed by your health care provider. °SEEK MEDICAL CARE IF: °· You feel like your heart is beating too fast or your pulse is not regular. °· You have any questions about your medicines. °· You have bleeding that will not stop. °SEEK IMMEDIATE MEDICAL CARE IF: °· You are dizzy or feel faint. °· It is hard to breathe or you feel short of breath. °· There is a change in discomfort in your chest. °· Your speech is slurred or you have trouble moving an arm or leg on one side of your body. °· You get a serious muscle cramp that does not go away. °· Your fingers or toes turn cold or blue. °Document Released: 06/18/2013 Document Revised: 01/12/2014 Document Reviewed: 06/18/2013 °ExitCare® Patient Information ©2015 [employer]. This information is not intended to replace advice given to you by your health care provider. Make sure you discuss any questions you have with your health care provider. ° °

## 2015-05-13 NOTE — Anesthesia Procedure Notes (Signed)
Procedure Name: MAC Date/Time: 05/13/2015 10:41 AM Performed by: Orvilla Fus A Pre-anesthesia Checklist: Patient identified, Emergency Drugs available, Suction available, Patient being monitored and Timeout performed Oxygen Delivery Method: Ambu bag Preoxygenation: Pre-oxygenation with 100% oxygen

## 2015-05-13 NOTE — Interval H&P Note (Signed)
History and Physical Interval Note:  05/13/2015 10:39 AM  Julie Swanson  has presented today for surgery, with the diagnosis of afib  The various methods of treatment have been discussed with the patient and family. After consideration of risks, benefits and other options for treatment, the patient has consented to  Procedure(s): CARDIOVERSION (N/A) as a surgical intervention .  The patient's history has been reviewed, patient examined, no change in status, stable for surgery.  I have reviewed the patient's chart and labs.  Questions were answered to the patient's satisfaction.     Aubery Douthat [employer]

## 2015-05-13 NOTE — Procedures (Signed)
Electrical Cardioversion Procedure Note Julie Swanson [id number] Apr 30, 1928  Procedure: Electrical Cardioversion Indications:  Atrial Fibrillation  Procedure Details Consent: Risks of procedure as well as the alternatives and risks of each were explained to the (patient/caregiver).  Consent for procedure obtained. Time Out: Verified patient identification, verified procedure, site/side was marked, verified correct patient position, special equipment/implants available, medications/allergies/relevent history reviewed, required imaging and test results available.  Performed  Patient placed on cardiac monitor, pulse oximetry, supplemental oxygen as necessary.  Sedation given: Propofol per anesthesiology Pacer pads placed anterior and posterior chest.  Cardioverted 1 time(s).  Cardioverted at 200J.  Evaluation Findings: Post procedure EKG shows: NSR Complications: None Patient did tolerate procedure well.   Julie Swanson 05/13/2015, 10:45 AM

## 2015-05-14 ENCOUNTER — Encounter (HOSPITAL_COMMUNITY): Payer: Self-pay | Admitting: Cardiology

## 2015-05-18 ENCOUNTER — Other Ambulatory Visit: Payer: Self-pay | Admitting: Physician Assistant

## 2015-05-18 ENCOUNTER — Ambulatory Visit (HOSPITAL_COMMUNITY)
Admission: RE | Admit: 2015-05-18 | Discharge: 2015-05-18 | Disposition: A | Discharge status: Home / Self Care | Payer: Medicare Other | Source: Ambulatory Visit | Attending: Nurse Practitioner | Admitting: Nurse Practitioner

## 2015-05-18 DIAGNOSIS — I480 Paroxysmal atrial fibrillation: Secondary | ICD-10-CM

## 2015-05-18 DIAGNOSIS — I491 Atrial premature depolarization: Secondary | ICD-10-CM | POA: Insufficient documentation

## 2015-05-18 DIAGNOSIS — I4891 Unspecified atrial fibrillation: Secondary | ICD-10-CM | POA: Diagnosis not present

## 2015-05-18 DIAGNOSIS — R9431 Abnormal electrocardiogram [ECG] [EKG]: Secondary | ICD-10-CM | POA: Insufficient documentation

## 2015-05-18 NOTE — Progress Notes (Addendum)
Patient in for EKG at patient request to ensure still in NSR after cardioversion last week. Patient continues to feel weak and wanted confirmation of NSR. Julie Coco NP to review EKG   Pt states she still feels weak and decreased inderal to 10 mg bid on her own which was her dose prior to recent issues with afib. EKG does confirm SR. EKG reads critical QTc but pt was discussed  with Dr. Shirlee Latch who is her regular cardiologist and did the cardioversion and QT does not measure out that long. She also says that she had rectal bleeding the day after cardioversion and she stopped DOAC but has not had any bleeding today and will start back tonight.Dr. Shirlee Latch reiterated to try and stay on the DOAC without missed doses. She does see a GI doctor tomorrow to further discuss hemorrhoid bleeding.Otherwise, she does follow up with Dr. Shirlee Latch 9/16.

## 2015-05-19 ENCOUNTER — Encounter (INDEPENDENT_AMBULATORY_CARE_PROVIDER_SITE_OTHER): Payer: Self-pay | Admitting: *Deleted

## 2015-05-19 ENCOUNTER — Ambulatory Visit (INDEPENDENT_AMBULATORY_CARE_PROVIDER_SITE_OTHER): Payer: Medicare Other | Admitting: Internal Medicine

## 2015-05-19 ENCOUNTER — Encounter (INDEPENDENT_AMBULATORY_CARE_PROVIDER_SITE_OTHER): Payer: Self-pay | Admitting: Internal Medicine

## 2015-05-19 VITALS — BP 102/62 | HR 64 | Temp 98.3°F | Ht 64.0 in | Wt 148.5 lb

## 2015-05-19 DIAGNOSIS — I6523 Occlusion and stenosis of bilateral carotid arteries: Secondary | ICD-10-CM | POA: Diagnosis not present

## 2015-05-19 DIAGNOSIS — K648 Other hemorrhoids: Secondary | ICD-10-CM | POA: Diagnosis not present

## 2015-05-19 MED ORDER — HYDROCORTISONE ACE-PRAMOXINE 1-1 % RE FOAM: 1.0000 | Freq: Two times a day (BID) | RECTAL | Status: DC

## 2015-05-19 NOTE — Patient Instructions (Addendum)
OV in 6 months. CBC with diff today Proctofoam twice a day.

## 2015-05-19 NOTE — Progress Notes (Signed)
Subjective:    Patient ID: Julie Swanson, female    DOB: 1928-09-08, 81 y.o.   MRN: [id number]  HPI Presents today with c/o that she had some rectal bleeding last week.   She noticed the blood in the commode. She says this occurs about every 30 days. She stopped the Eliquis last week and started back last night. She describes the bleeding as a small amount.  She has not seen any blood since Monday.  Sometimes she will see blood on tissue. She feels pressure in her rectum. Appetite has been good. No weight loss.  She denies any abdominal pain. Cardioverted last week for atrial fib.       08/14/2014: Colonoscopy:  Dr. Karilyn Cota  Indications: Patient is 81 year old Caucasian female who experienced rectal bleeding 5 days in a row while on Eliquis. Bleeding has stopped since medication was discontinued and her H&H was normal. She also gives history of intermittent diarrhea. She denies abdominal pain anorexia. Family history significant for CRC and brother who was 31 at the time of diagnosis and died due to unrelated causes. Last colonoscopy was 10 years ago.  Impression:  Examination performed to cecum. Small AV malformation at ascending colon noted with oozing. It was coagulated with argon plasma coagulator. Two small diverticula at sigmoid colon. Large internal hemorrhoids felt to be source of patient's rectal bleeding. Small external hemorrhoids   Review of Systems Past Medical History  Diagnosis Date  . CAD (coronary artery disease)   . MVP (mitral valve prolapse)   . Palpitations   . HTN (hypertension)   . HLD (hyperlipidemia)   . Hyperkalemia   . Chest pain   . Osteoporosis   . Macular degeneration   . Arthritis   . Atrial fibrillation   . Complication of anesthesia     pt states "hard to wake up"    Past Surgical History  Procedure Laterality Date  . Hysterectomy - unknown type    . Abdominal hysterectomy    . Cystectomy      BREAST  . Cataract extraction     BOTH EYES  . Colonoscopy    . Colonoscopy N/A 08/14/2014    Procedure: COLONOSCOPY;  Surgeon: Malissa Hippo, MD;  Location: [facility];  Service: Endoscopy;  Laterality: N/A;  225  . Cardioversion N/A 05/13/2015    Procedure: CARDIOVERSION;  Surgeon: Laurey Morale, MD;  Location: [facility] ENDOSCOPY;  Service: Cardiovascular;  Laterality: N/A;    Allergies  Allergen Reactions  . Valtrex [Valacyclovir Hcl] Palpitations    Chest pain.  Marland Kitchen Antihistamines, Chlorpheniramine-Type Other (See Comments)    Patient states it makes her feel like she's in "[place]"  . Evista [Raloxifene] Other (See Comments)    Causes knots  . Feldene [Piroxicam] Other (See Comments)    unknown  . Toprol Xl [Metoprolol Tartrate]     Bad dreams  . Tropicamide     Dizziness, lethargic, non-responsive    Current Outpatient Prescriptions on File Prior to Visit  Medication Sig Dispense Refill  . acetaminophen (TYLENOL) 500 MG tablet Take 500 mg by mouth 2 (two) times daily as needed for moderate pain.    Marland Kitchen apixaban (ELIQUIS) 5 MG TABS tablet Take 1 tablet (5 mg total) by mouth 2 (two) times daily. 180 tablet 2  . atorvastatin (LIPITOR) 10 MG tablet TAKE ONE TABLET BY MOUTH ONCE DAILY 30 tablet 0  . calcium-vitamin D (OSCAL WITH D) 500-200 MG-UNIT per tablet Take 1 tablet  by mouth daily with breakfast.     . cycloSPORINE (RESTASIS) 0.05 % ophthalmic emulsion Place 1 drop into both eyes daily.     Marland Kitchen dronedarone (MULTAQ) 400 MG tablet TAKE 1 TABLET TWICE DAILY  WITH  A  MEAL 180 tablet 1  . hydrochlorothiazide (HYDRODIURIL) 25 MG tablet TAKE ONE TABLET BY MOUTH ONCE DAILY 30 tablet 0  . Lutein 20 MG CAPS Take 1 capsule by mouth daily.     . Multiple Vitamins-Minerals (ICAPS) CAPS Take 2 capsules by mouth daily.    . propranolol (INDERAL) 20 MG tablet Take 1 tablet (20 mg total) by mouth 2 (two) times daily. (Patient taking differently: Take 10 mg by mouth 2 (two) times daily. ) 60 tablet 6  . diltiazem (CARDIZEM) 30  MG tablet Take 1 tablet every 4 hours AS NEEDED for afib HR>100 and BP>100 (Patient not taking: Reported on 05/19/2015) 30 tablet 1   No current facility-administered medications on file prior to visit.        Objective:   Physical Exam Blood pressure 102/62, pulse 64, temperature 98.3 F (36.8 C), height  (1.626 m), weight 148 lb 8 oz (67.359 kg). Alert and oriented. Skin warm and dry. Oral mucosa is moist.   . Sclera anicteric, conjunctivae is pink. Thyroid not enlarged. No cervical lymphadenopathy. Lungs clear. Heart regular rate and rhythm.  Abdomen is soft. Bowel sounds are positive. No hepatomegaly. No abdominal masses felt. No tenderness.  No edema to lower extremities.   Rectal deferred      Assessment & Plan:  Rectal bleeding resolved. Maintained on Eliquis for atrial fib. [facility] Colonoscopy in December. CBC today. Proctofoam BID x 5 days. If bleeding reoccurs, please call our office. If large amt of bleeding,go to the ED.

## 2015-05-20 LAB — CBC WITH DIFFERENTIAL/PLATELET
BASOS ABS: 0 10*3/uL (ref 0.0–0.1)
BASOS PCT: 0 % (ref 0–1)
EOS ABS: 0.2 10*3/uL (ref 0.0–0.7)
EOS PCT: 3 % (ref 0–5)
HCT: 34.5 % — ABNORMAL LOW (ref 36.0–46.0)
Hemoglobin: 11.8 g/dL — ABNORMAL LOW (ref 12.0–15.0)
LYMPHS PCT: 32 % (ref 12–46)
Lymphs Abs: 1.7 10*3/uL (ref 0.7–4.0)
MCH: 29.9 pg (ref 26.0–34.0)
MCHC: 34.2 g/dL (ref 30.0–36.0)
MCV: 87.6 fL (ref 78.0–100.0)
MONO ABS: 0.6 10*3/uL (ref 0.1–1.0)
MPV: 9.6 fL (ref 8.6–12.4)
Monocytes Relative: 12 % (ref 3–12)
Neutro Abs: 2.8 10*3/uL (ref 1.7–7.7)
Neutrophils Relative %: 53 % (ref 43–77)
PLATELETS: 247 10*3/uL (ref 150–400)
RBC: 3.94 MIL/uL (ref 3.87–5.11)
RDW: 13.9 % (ref 11.5–15.5)
WBC: 5.3 10*3/uL (ref 4.0–10.5)

## 2015-05-21 ENCOUNTER — Telehealth (INDEPENDENT_AMBULATORY_CARE_PROVIDER_SITE_OTHER): Payer: Self-pay | Admitting: *Deleted

## 2015-05-21 DIAGNOSIS — K625 Hemorrhage of anus and rectum: Secondary | ICD-10-CM

## 2015-05-21 NOTE — Telephone Encounter (Signed)
.  Per Delrae Rend patient to have CBC in 4 weeks.

## 2015-05-24 ENCOUNTER — Encounter: Payer: Self-pay | Admitting: Family Medicine

## 2015-05-24 ENCOUNTER — Ambulatory Visit (INDEPENDENT_AMBULATORY_CARE_PROVIDER_SITE_OTHER): Payer: Medicare Other | Admitting: Family Medicine

## 2015-05-24 ENCOUNTER — Other Ambulatory Visit: Payer: Self-pay | Admitting: Family Medicine

## 2015-05-24 VITALS — BP 118/74 | Ht 64.5 in | Wt 149.0 lb

## 2015-05-24 DIAGNOSIS — I6523 Occlusion and stenosis of bilateral carotid arteries: Secondary | ICD-10-CM | POA: Diagnosis not present

## 2015-05-24 DIAGNOSIS — E785 Hyperlipidemia, unspecified: Secondary | ICD-10-CM

## 2015-05-24 DIAGNOSIS — K648 Other hemorrhoids: Secondary | ICD-10-CM | POA: Diagnosis not present

## 2015-05-24 DIAGNOSIS — R7309 Other abnormal glucose: Secondary | ICD-10-CM

## 2015-05-24 DIAGNOSIS — M810 Age-related osteoporosis without current pathological fracture: Secondary | ICD-10-CM | POA: Diagnosis not present

## 2015-05-24 DIAGNOSIS — Z23 Encounter for immunization: Secondary | ICD-10-CM

## 2015-05-24 DIAGNOSIS — I4891 Unspecified atrial fibrillation: Secondary | ICD-10-CM | POA: Diagnosis not present

## 2015-05-24 DIAGNOSIS — E871 Hypo-osmolality and hyponatremia: Secondary | ICD-10-CM | POA: Diagnosis not present

## 2015-05-24 DIAGNOSIS — R7303 Prediabetes: Secondary | ICD-10-CM

## 2015-05-24 LAB — POCT GLYCOSYLATED HEMOGLOBIN (HGB A1C): Hemoglobin A1C: 5.8

## 2015-05-24 MED ORDER — ATORVASTATIN CALCIUM 10 MG PO TABS
10.0000 mg | ORAL_TABLET | Freq: Every day | ORAL | Status: DC
Start: 2015-05-24 — End: 2015-09-08

## 2015-05-24 NOTE — Progress Notes (Signed)
   Subjective:    Patient ID: Julie Swanson, female    DOB: 1928-01-19, 81 y.o.   MRN: [id number]  Hyperlipidemia This is a chronic problem. The current episode started more than 1 year ago. Pertinent negatives include no chest pain. Treatments tried: lipitor. There are no compliance problems.   Diabetes She presents for her follow-up diabetic visit. Diabetes type: prediabetes. Pertinent negatives for hypoglycemia include no confusion. Pertinent negatives for diabetes include no chest pain, no fatigue, no polydipsia, no polyphagia and no weakness. Risk factors for coronary artery disease include diabetes mellitus and dyslipidemia. Current diabetic treatment includes diet. She is compliant with treatment all of the time.  pt taking on blood thinner for a fib. Pt having rectal bleeding about once monthly. Pt seeing dr Karilyn Cota. Hb was low last week 11.8.  Prediabetes a1c today 5.8. Patient relates she takes her cholesterol medicine regular basis does not have any problems with it She takes of blunt and on regular basis she denies any excessive bruising. Denies any bleeding issues. She does take her bladder medicine and seems to help. She also states she takes her hydrochlorothiazide. Recent lab work showed a low sodium. Denies excessive thirst.  Review of Systems  Constitutional: Negative for activity change, appetite change and fatigue.  HENT: Negative for congestion.   Respiratory: Negative for cough.   Cardiovascular: Negative for chest pain.  Gastrointestinal: Positive for diarrhea and blood in stool. Negative for nausea, abdominal pain and rectal pain.  Endocrine: Negative for polydipsia and polyphagia.  Neurological: Negative for weakness.  Psychiatric/Behavioral: Negative for confusion.       Objective:   Physical Exam  Constitutional: She appears well-nourished. No distress.  Cardiovascular: Normal rate, regular rhythm and normal heart sounds.   No murmur heard. Pulmonary/Chest:  Effort normal and breath sounds normal. No respiratory distress.  Abdominal: Soft. She exhibits no distension. There is no tenderness.  Musculoskeletal: She exhibits no edema.  Lymphadenopathy:    She has no cervical adenopathy.  Neurological: She is alert. She exhibits normal muscle tone.  Psychiatric: Her behavior is normal.  Vitals reviewed.         Assessment & Plan:  1. Prediabetes Overall under good control. Watch diet closely. Stay physically active - POCT glycosylated hemoglobin (Hb A1C)  2. Atrial fibrillation, unspecified Intermittent atrial fibrillation on blood thinner heart rate controlled today  3. Hyperlipidemia History hyperlipidemia check lipid profile. Watch diet closely - Lipid panel  4. Hyponatremia Recent lab work showed hyponatremia recheck metabolic 7 patient denies drinking excessive water - Basic metabolic panel  5. Osteoporosis Patient is due for bone density. Check this. - DG Bone Density  6. Encounter for immunization Flu shot today.  7. Other hemorrhoids Has hemorrhoids. Certainly if they get worse with her cardiac condition I recommend [facility] surgery in [place]

## 2015-05-25 ENCOUNTER — Encounter: Payer: Self-pay | Admitting: Family Medicine

## 2015-05-25 LAB — LIPID PANEL
Chol/HDL Ratio: 3.3 ratio units (ref 0.0–4.4)
Cholesterol, Total: 139 mg/dL (ref 100–199)
HDL: 42 mg/dL (ref >39)
LDL Calculated: 68 mg/dL (ref 0–99)
Triglycerides: 147 mg/dL (ref 0–149)
VLDL Cholesterol Cal: 29 mg/dL (ref 5–40)

## 2015-05-25 LAB — BASIC METABOLIC PANEL
BUN / CREAT RATIO: 12 (ref 11–26)
BUN: 13 mg/dL (ref 8–27)
CHLORIDE: 99 mmol/L (ref 97–108)
CO2: 27 mmol/L (ref 18–29)
Calcium: 9.5 mg/dL (ref 8.7–10.3)
Creatinine, Ser: 1.06 mg/dL — ABNORMAL HIGH (ref 0.57–1.00)
GFR calc non Af Amer: 48 mL/min/{1.73_m2} — ABNORMAL LOW (ref >59)
GFR, EST AFRICAN AMERICAN: 55 mL/min/{1.73_m2} — AB (ref >59)
Glucose: 100 mg/dL — ABNORMAL HIGH (ref 65–99)
POTASSIUM: 4.6 mmol/L (ref 3.5–5.2)
SODIUM: 140 mmol/L (ref 134–144)

## 2015-05-28 ENCOUNTER — Encounter: Payer: Self-pay | Admitting: Cardiology

## 2015-05-28 ENCOUNTER — Ambulatory Visit (HOSPITAL_COMMUNITY)
Admission: RE | Admit: 2015-05-28 | Discharge: 2015-05-28 | Disposition: A | Discharge status: Home / Self Care | Payer: Medicare Other | Source: Ambulatory Visit | Attending: Family Medicine | Admitting: Family Medicine

## 2015-05-28 ENCOUNTER — Ambulatory Visit: Payer: Medicare Other | Admitting: Cardiology

## 2015-05-28 ENCOUNTER — Ambulatory Visit (INDEPENDENT_AMBULATORY_CARE_PROVIDER_SITE_OTHER): Payer: Medicare Other | Admitting: Cardiology

## 2015-05-28 VITALS — BP 126/70 | HR 63 | Ht 64.0 in | Wt 147.4 lb

## 2015-05-28 DIAGNOSIS — K649 Unspecified hemorrhoids: Secondary | ICD-10-CM

## 2015-05-28 DIAGNOSIS — I6523 Occlusion and stenosis of bilateral carotid arteries: Secondary | ICD-10-CM

## 2015-05-28 DIAGNOSIS — I480 Paroxysmal atrial fibrillation: Secondary | ICD-10-CM

## 2015-05-28 DIAGNOSIS — M8580 Other specified disorders of bone density and structure, unspecified site: Secondary | ICD-10-CM | POA: Diagnosis present

## 2015-05-28 DIAGNOSIS — Z780 Asymptomatic menopausal state: Secondary | ICD-10-CM | POA: Insufficient documentation

## 2015-05-28 DIAGNOSIS — K625 Hemorrhage of anus and rectum: Secondary | ICD-10-CM

## 2015-05-28 DIAGNOSIS — M8589 Other specified disorders of bone density and structure, multiple sites: Secondary | ICD-10-CM | POA: Diagnosis not present

## 2015-05-28 NOTE — Patient Instructions (Signed)
Medication Instructions:  No changes today  Labwork: None today  Testing/Procedures: None today  Follow-Up: Your physician wants you to follow-up in: 4 months with Dr Shirlee Latch. (January 2017).  You will receive a reminder letter in the mail two months in advance. If you don't receive a letter, please call our office to schedule the follow-up appointment.   Any Other Special Instructions Will Be Listed Below (If Applicable). You have been referred to Dr Dwain Sarna at [facility] for evaluation for hemorrhoid banding.

## 2015-05-30 NOTE — Progress Notes (Signed)
Patient ID: Julie Swanson, female   DOB: Sep 22, 1927, 81 y.o.   MRN: [id number] PCP: Dr. Lilyan Punt  81 yo presents for followup of paroxysmal atrial fibrillation.  She had been having runs of rapid tachypalpitations when I saw her initially.  She was quite symptomatic with episodes.  Event monitor showed runs of atrial fibrillation with RVR.  I started her on Multaq for rhythm control given her significant symptoms and on apixaban.  She did not tolerate diltiazem due to ankle swelling and was switched over to Coreg for rate control.  She did not tolerate Coreg and is back on propranolol, which she does ok with.  Echo in 11/14 showed EF 60-65% with mild MR. No exertional chest pain or dyspnea. No lightheadedness.  She has had occasional rectal bleeding recently that seems to have been relatively mild.  She saw a gastroenterologist and had c-scope showing hemorrhoids that were thought to be the etiology of the bleeding. Currently, no bleeding but continues to have rectal bleeding from time to time.    She went into atrial fibrillation in 8/16.  She had DCCV in 9/16 back to NSR and is in NSR today.  She has felt no tachypalpitations.  She remains on Multaq.  She feels less fatigued in NSR.  No dyspnea walking or going up a few steps. No orthopnea/PND.  No chest pain.   ECG: NSR, nonspecific T wave flattening, QTC calculated 548 msec but this is counting a U wave.    Labs (10/14): LDL 90, HDL 41, K 4.1, creatinine 0.9 Labs (4/15): LDL 74, HDL 40, K 4.1, creatinine 4.09 Labs (10/15): LDL 73, HDL 38 Labs (12/15): K 4, creatinine 0.9, HCT 36 Labs (8/16): K 4, creatinine 0.97, HCT 37.8, TSH normal Labs (9/16): K 4.6, creatinine 1.06, LDL 68, HDL 47  PMH: 1. LHC (8/10) with minimal nonobstructive disease.  ETT (10/13) with 2'53" exercise, nonspecific ECG changes with no evidence for ischemia.  2. Mitral valve prolapse: Echo (11/14) with EF 60-65%, mild MR.  3. Atrial fibrillation:  Event monitor (10/13)  with PACs only.  Event monitor (11/14) showed atrial fibrillation with RVR. She does not tolerate metoprolol (nightmares).  She had ankle swelling with diltiazem. Possible fatigue/diarrhea with Coreg. Holter (8/16) with persistent atrial fibrillation.  DCCV to NSR in 9/16.  4. HTN 5. Hyperlipidemia 6. Hysterectomy. 7. Carotid disease: Carotid dopplers (1/15) with prominent plaque, mild stenosis.  8. Internal hemorrhoids with episodic rectal bleeding.  9. Macular degeneration.   SH: Widow, lives in [place], nonsmoker.  2 daughters.   FH: Mother with CVA, CAD.  Father with CAD.   ROS: All systems reviewed and negative except as per HPI.   Current Outpatient Prescriptions  Medication Sig Dispense Refill  . acetaminophen (TYLENOL) 500 MG tablet Take 500 mg by mouth 2 (two) times daily as needed for moderate pain.    Marland Kitchen apixaban (ELIQUIS) 5 MG TABS tablet Take 1 tablet (5 mg total) by mouth 2 (two) times daily. 180 tablet 2  . atorvastatin (LIPITOR) 10 MG tablet Take 1 tablet (10 mg total) by mouth daily. 90 tablet 1  . calcium-vitamin D (OSCAL WITH D) 500-200 MG-UNIT per tablet Take 1 tablet by mouth daily with breakfast.     . cycloSPORINE (RESTASIS) 0.05 % ophthalmic emulsion Place 1 drop into both eyes daily.     Marland Kitchen diltiazem (CARDIZEM) 30 MG tablet Take 1 tablet every 4 hours AS NEEDED for afib HR>100 and BP>100 30 tablet 1  .  dronedarone (MULTAQ) 400 MG tablet TAKE 1 TABLET TWICE DAILY  WITH  A  MEAL 180 tablet 1  . hydrochlorothiazide (HYDRODIURIL) 25 MG tablet TAKE ONE TABLET BY MOUTH ONCE DAILY 30 tablet 0  . hydrocortisone-pramoxine (PROCTOFOAM HC) rectal foam Place 1 applicator rectally 2 (two) times daily. 10 g 0  . Lutein 20 MG CAPS Take 1 capsule by mouth daily.     . Multiple Vitamins-Minerals (ICAPS) CAPS Take 2 capsules by mouth daily.    . propranolol (INDERAL) 10 MG tablet Take 10 mg by mouth 2 (two) times daily.     No current facility-administered medications for this  visit.    BP 126/70 mmHg  Pulse 63  Ht  (1.626 m)  Wt 147 lb 6.4 oz (66.86 kg)  BMI 25.29 kg/m2 General: NAD Neck: No JVD, no thyromegaly or thyroid nodule.  Lungs: Clear to auscultation bilaterally with normal respiratory effort. CV: Nondisplaced PMI.  Heart irregular S1/S2, no S3/S4, no murmur.  No edema.  Right carotid bruit.  Normal pedal pulses.  Abdomen: Soft, nontender, no hepatosplenomegaly, no distention.  Skin: Intact without lesions or rashes.  Neurologic: Alert and oriented x 3.  Psych: Normal affect. Extremities: No clubbing or cyanosis.   Assessment/Plan: 1. Atrial fibrillation:  Paroxysmal.  She tolerates atrial fibrillation poorly with significantly increased fatigue.  She is back in NSR on Multaq today.  - Continue Multaq and Eliquis. QTc is reported as prolonged, but a separate U wave appears to be counted.  - I will continue Multaq for now.  If atrial fibrillation recurs quickly after DCCV, I will switch anti-arrhythmics to dofetilide or amiodarone.  - Continue propranolol 10 mg bid.  She has not tolerated diltiazem CD or uptitration of ptoprano.   - She continues to have occasional rectal bleeding that has been thought to be hemorrhoidal (had colonoscopy by GI). Continue apixaban.  I am going to refer to [facility] to see if the hemorrhoids can be treated.  2. HTN:  BP controlled.   3. Hyperlipidemia: Good lipids in 9/16.  4. Carotid bruit: Mild stenosis only.    Marca Ancona 05/30/2015

## 2015-05-31 ENCOUNTER — Telehealth: Payer: Self-pay | Admitting: Cardiology

## 2015-05-31 NOTE — Telephone Encounter (Signed)
Pt's daughter states she was told by her GI doctor, Dr Loreta Ave, that Dr Romie Levee (in the same practice with Dr Dwain Sarna) also treats hemorrhoid/rectal problems. Pt's daughter asking Dr Alford Highland recommendation about referral to Dr Maisie Fus or Dr Lucretia Roers.

## 2015-05-31 NOTE — Telephone Encounter (Signed)
Either Maisie Fus or Derrell Lolling would be fine.

## 2015-05-31 NOTE — Telephone Encounter (Signed)
Pt states Dr Doreen Salvage office told her Dr Dwain Sarna does not treat hemorrhoids/hemmorhoid banding, his office recommended appt with Dr Lucretia Roers for this treatment.  Pt states she wants to be sure it is Ok with Dr Shirlee Latch for her to schedule appt with Dr Lucretia Roers.   Pt advised I will forward to Dr Shirlee Latch for review.

## 2015-05-31 NOTE — Telephone Encounter (Signed)
New message    Dr. Shirlee Latch referral her to see Dr. Dwain Sarna.   The office called stating she should see another MD verse Dr. Dwain Sarna.   Dr. Dwain Sarna no longer does that treatment anymore.

## 2015-05-31 NOTE — Telephone Encounter (Signed)
Pt advised Dr Maisie Fus or Dr Lance Bosch with Dr Shirlee Latch.

## 2015-05-31 NOTE — Telephone Encounter (Signed)
LMTCB for pt 

## 2015-06-01 ENCOUNTER — Encounter (INDEPENDENT_AMBULATORY_CARE_PROVIDER_SITE_OTHER): Payer: Self-pay | Admitting: *Deleted

## 2015-06-01 ENCOUNTER — Other Ambulatory Visit (INDEPENDENT_AMBULATORY_CARE_PROVIDER_SITE_OTHER): Payer: Self-pay | Admitting: *Deleted

## 2015-06-01 DIAGNOSIS — K625 Hemorrhage of anus and rectum: Secondary | ICD-10-CM

## 2015-06-08 ENCOUNTER — Encounter: Payer: Self-pay | Admitting: Family Medicine

## 2015-06-08 ENCOUNTER — Ambulatory Visit (INDEPENDENT_AMBULATORY_CARE_PROVIDER_SITE_OTHER): Payer: Medicare Other | Admitting: Family Medicine

## 2015-06-08 VITALS — BP 136/78 | Ht 64.5 in | Wt 149.4 lb

## 2015-06-08 DIAGNOSIS — I6523 Occlusion and stenosis of bilateral carotid arteries: Secondary | ICD-10-CM

## 2015-06-08 DIAGNOSIS — I10 Essential (primary) hypertension: Secondary | ICD-10-CM | POA: Diagnosis not present

## 2015-06-08 DIAGNOSIS — M8580 Other specified disorders of bone density and structure, unspecified site: Secondary | ICD-10-CM

## 2015-06-08 MED ORDER — ALENDRONATE SODIUM 70 MG PO TABS: 70.0000 mg | ORAL_TABLET | ORAL | Status: DC

## 2015-06-08 NOTE — Progress Notes (Signed)
   Subjective:    Patient ID: Julie Swanson, female    DOB: 1927/11/23, 81 y.o.   MRN: [id number]  Hypertension This is a chronic problem. The current episode started more than 1 year ago. Pertinent negatives include no chest pain. There are no compliance problems.    Patient in today for a blood pressure check and to discuss recent lab results.  Patient would like a copy of lab results.   Review of Systems  Constitutional: Negative for activity change, appetite change and fatigue.  HENT: Negative for congestion.   Respiratory: Negative for cough.   Cardiovascular: Negative for chest pain.  Gastrointestinal: Negative for abdominal pain and blood in stool.  Endocrine: Negative for polydipsia and polyphagia.  Neurological: Negative for weakness.  Psychiatric/Behavioral: Negative for confusion.       Objective:   Physical Exam  Constitutional: She appears well-nourished. No distress.  Cardiovascular: Normal rate, regular rhythm and normal heart sounds.   No murmur heard. Pulmonary/Chest: Effort normal and breath sounds normal. No respiratory distress.  Musculoskeletal: She exhibits no edema.  Lymphadenopathy:    She has no cervical adenopathy.  Neurological: She is alert. She exhibits normal muscle tone.  Psychiatric: Her behavior is normal.  Vitals reviewed.         Assessment & Plan:  We went over the results of the lab work today. Went over bone density as well Greater than half the time was spent discussing treatment options Because of her elevated risk of hip fracture I recommend Fosamax once weekly proper way to take it was discussed she will follow-up in approximately 6-8 weeks to see how well she is tolerating it we'll repeat a bone density in 2 months time

## 2015-06-15 ENCOUNTER — Ambulatory Visit (HOSPITAL_COMMUNITY)
Admission: RE | Admit: 2015-06-15 | Discharge: 2015-06-15 | Disposition: A | Discharge status: Home / Self Care | Payer: Medicare Other | Source: Ambulatory Visit | Attending: Nurse Practitioner | Admitting: Nurse Practitioner

## 2015-06-15 ENCOUNTER — Telehealth: Payer: Self-pay | Admitting: Cardiology

## 2015-06-15 VITALS — BP 124/76 | HR 96 | Ht 64.0 in | Wt 150.6 lb

## 2015-06-15 DIAGNOSIS — I481 Persistent atrial fibrillation: Secondary | ICD-10-CM | POA: Insufficient documentation

## 2015-06-15 DIAGNOSIS — H3530 Unspecified macular degeneration: Secondary | ICD-10-CM | POA: Diagnosis not present

## 2015-06-15 DIAGNOSIS — Z79899 Other long term (current) drug therapy: Secondary | ICD-10-CM | POA: Diagnosis not present

## 2015-06-15 DIAGNOSIS — Z7902 Long term (current) use of antithrombotics/antiplatelets: Secondary | ICD-10-CM | POA: Insufficient documentation

## 2015-06-15 DIAGNOSIS — I2510 Atherosclerotic heart disease of native coronary artery without angina pectoris: Secondary | ICD-10-CM | POA: Insufficient documentation

## 2015-06-15 DIAGNOSIS — Z7983 Long term (current) use of bisphosphonates: Secondary | ICD-10-CM | POA: Insufficient documentation

## 2015-06-15 DIAGNOSIS — Z8249 Family history of ischemic heart disease and other diseases of the circulatory system: Secondary | ICD-10-CM | POA: Insufficient documentation

## 2015-06-15 DIAGNOSIS — I341 Nonrheumatic mitral (valve) prolapse: Secondary | ICD-10-CM | POA: Diagnosis not present

## 2015-06-15 DIAGNOSIS — I4819 Other persistent atrial fibrillation: Secondary | ICD-10-CM

## 2015-06-15 DIAGNOSIS — E785 Hyperlipidemia, unspecified: Secondary | ICD-10-CM | POA: Diagnosis not present

## 2015-06-15 DIAGNOSIS — I10 Essential (primary) hypertension: Secondary | ICD-10-CM | POA: Diagnosis not present

## 2015-06-15 DIAGNOSIS — M810 Age-related osteoporosis without current pathological fracture: Secondary | ICD-10-CM | POA: Diagnosis not present

## 2015-06-15 MED ORDER — PROPRANOLOL HCL 10 MG PO TABS: 15.0000 mg | ORAL_TABLET | Freq: Two times a day (BID) | ORAL | Status: DC

## 2015-06-15 NOTE — Patient Instructions (Signed)
Your physician has recommended you make the following change in your medication:  1)Stop multaq 2)Increase propranolol to 1 1/2 tablets ( ) twice daily

## 2015-06-15 NOTE — Telephone Encounter (Signed)
Pt's daughter states pt's heart rate has been consistently 95-100 since Thursday. Pt's daughter states pt's  BP 115-130/80, pt complains of leg weakness, denies SOB or lightheadedness, pt denies palpitations. Pt's daughter states pt thinks she has been in AFib since Thursday.

## 2015-06-15 NOTE — Telephone Encounter (Signed)
Reviewed with Brynda Rim --office visit today in [facility] or [facility]. Pt has been scheduled to see Rudi Coco, NP today at [facility] in [facility], pt advised.

## 2015-06-15 NOTE — Telephone Encounter (Signed)
New Message  Patient c/o Palpitations:  High priority if patient c/o lightheadedness and shortness of breath.  1. How long have you been having palpitations? Thursday  06-10-15  2. Are you currently experiencing lightheadedness and shortness of breath?  No   3. Have you checked your BP and heart rate? (document readings) HR  Mid or upper 90  4. Are you experiencing any other symptoms? feeling her legs are weak

## 2015-06-16 ENCOUNTER — Encounter (HOSPITAL_COMMUNITY): Payer: Self-pay | Admitting: Nurse Practitioner

## 2015-06-16 NOTE — Progress Notes (Signed)
Patient ID: Julie Swanson, female   DOB: [birth year], 81 y.o.   MRN: [id number]     Primary Care Physician: Lilyan Punt, MD Referring Physician: Dr. Rockey Situ Julie Swanson is a 81 y.o. female with a h/o PAF that has been on multaq and apixaban and has been maintaining sinus rhythm. She was helping her daughter with a yard sale in the recent past  and had worked very hard on  to help get things organized. She was in the heat mid morning when she suddenly became weak, limp and diaphoretic. Her daughter took her inside and cooled her off and got her something to drink. BP cuff showed heart rate of 160 bpm which eventually slowed to less than 100 bpm over a 30 min span. She started feeling stronger and today other than being a little tired, she is at her baseline.She wore a   a 24 hour monitor which showed persistent afib.  She was set up for cardioversion and did return to SR but has had ERAF.  She does have intermittent rectal bleeding, present since the use of DOAC,  worked up with normal colonoscopy and thought to be secondary to internal hemorrhoids. This happens every 4-6 weeks and she will stop her blood thinners for 3 days and resume on the fourth. This just occurred the latter part of July but she has been back on apixaban since 7-31.  She was toll that if this became more of a problem, she could be considered for surgery.  She called afib office today stating that she had been out of rhythm for several days. Discussed with her and her two daughters, change in antiarrythmic approach, amiodarone vrs tikosyn, discussed as well as rate control. Right now, she does not feel that bad in afib, but would need more rate control if this is her choice.   Today, she denies symptoms of palpitations, chest pain, shortness of breath, orthopnea, PND, lower extremity edema, dizziness, presyncope, syncope, or neurologic sequela. Mild fatigue, some lower leg weakness. The patient is tolerating medications without  difficulties and is otherwise without complaint today.   Past Medical History  Diagnosis Date  . CAD (coronary artery disease)   . MVP (mitral valve prolapse)   . Palpitations   . HTN (hypertension)   . HLD (hyperlipidemia)   . Hyperkalemia   . Chest pain   . Osteoporosis   . Macular degeneration   . Arthritis   . Atrial fibrillation (HCC)   . Complication of anesthesia     pt states "hard to wake up"   Past Surgical History  Procedure Laterality Date  . Hysterectomy - unknown type    . Abdominal hysterectomy    . Cystectomy      BREAST  . Cataract extraction      BOTH EYES  . Colonoscopy    . Colonoscopy N/A 08/14/2014    Procedure: COLONOSCOPY;  Surgeon: Malissa Hippo, MD;  Location: [facility];  Service: Endoscopy;  Laterality: N/A;  225  . Cardioversion N/A 05/13/2015    Procedure: CARDIOVERSION;  Surgeon: Laurey Morale, MD;  Location: [facility] ENDOSCOPY;  Service: Cardiovascular;  Laterality: N/A;    Current Outpatient Prescriptions  Medication Sig Dispense Refill  . acetaminophen (TYLENOL) 500 MG tablet Take 500 mg by mouth 2 (two) times daily as needed for moderate pain.    Marland Kitchen alendronate (FOSAMAX) 70 MG tablet Take 1 tablet (70 mg total) by mouth every 7 (seven) days. Take with  a full glass of water on an empty stomach. 4 tablet 11  . apixaban (ELIQUIS) 5 MG TABS tablet Take 1 tablet (5 mg total) by mouth 2 (two) times daily. 180 tablet 2  . atorvastatin (LIPITOR) 10 MG tablet Take 1 tablet (10 mg total) by mouth daily. 90 tablet 1  . calcium-vitamin D (OSCAL WITH D) 500-200 MG-UNIT per tablet Take 1 tablet by mouth daily with breakfast.     . cycloSPORINE (RESTASIS) 0.05 % ophthalmic emulsion Place 1 drop into both eyes daily.     . hydrochlorothiazide (HYDRODIURIL) 25 MG tablet TAKE ONE TABLET BY MOUTH ONCE DAILY 30 tablet 0  . hydrocortisone-pramoxine (PROCTOFOAM HC) rectal foam Place 1 applicator rectally 2 (two) times daily. 10 g 0  . Lutein 20 MG CAPS Take 1  capsule by mouth daily.     . Multiple Vitamins-Minerals (ICAPS) CAPS Take 2 capsules by mouth daily.    . propranolol (INDERAL) 10 MG tablet Take 1.5 tablets (15 mg total) by mouth 2 (two) times daily.    Marland Kitchen diltiazem (CARDIZEM) 30 MG tablet Take 1 tablet every 4 hours AS NEEDED for afib HR>100 and BP>100 (Patient not taking: Reported on 06/15/2015) 30 tablet 1   No current facility-administered medications for this encounter.    Allergies  Allergen Reactions  . Valtrex [Valacyclovir Hcl] Palpitations    Chest pain.  Marland Kitchen Antihistamines, Chlorpheniramine-Type Other (See Comments)    Patient states it makes her feel like she's in "[place]"  . Evista [Raloxifene] Other (See Comments)    Causes knots  . Feldene [Piroxicam] Other (See Comments)    unknown  . Toprol Xl [Metoprolol Tartrate]     Bad dreams  . Tropicamide     Dizziness, lethargic, non-responsive    Social History   Social History  . Marital Status: Widowed    Spouse Name: N/A  . Number of Children: N/A  . Years of Education: N/A   Occupational History  . Not on file.   Social History Main Topics  . Smoking status: Never Smoker   . Smokeless tobacco: Never Used  . Alcohol Use: No  . Drug Use: No  . Sexual Activity: Not on file   Other Topics Concern  . Not on file   Social History Narrative    Family History  Problem Relation Age of Onset  . Stroke    . Heart attack    . Heart disease Mother   . Stroke Mother     ane melanoma  . Heart disease Father   . Bladder Cancer Father   . Cancer Brother   . CAD Brother     ALL  . CAD Sister     X2    ROS- All systems are reviewed and negative except as per the HPI above  Physical Exam: Filed Vitals:   06/15/15 1525  BP: 124/76  Pulse: 96  Height: 5\' 4"  (1.626 m)  Weight: 150 lb 9.6 oz (68.312 kg)    GEN- The patient is well appearing, alert and oriented x 3 today.   Head- normocephalic, atraumatic Eyes-  Sclera clear, conjunctiva pink Ears-  hearing intact Oropharynx- clear Neck- supple, no JVP Lymph- no cervical lymphadenopathy Lungs- Clear to ausculation bilaterally, normal work of breathing Heart- Irregular rate and rhythm, no murmurs, rubs or gallops, PMI not laterally displaced GI- soft, NT, ND, + BS Extremities- no clubbing, cyanosis, or edema MS- no significant deformity or atrophy Skin- no rash or lesion Psych-  euthymic mood, full affect Neuro- strength and sensation are intact  EKG- EKG today shows afib at 96 bpm, QRS int 86 ms, QTc 452 ms Epic records reviewed  Assessment and Plan:  1.Persistent afib ERAF s/p cardioversion 9/1  Discussed at length options going forward amiodarone or Tikosyn vrs rate control. Multiple questions answered for the pt and daughters. There is some concern with macular degeneration if amiodarone may worsen eye site. The daughter is to call pt ophthalmologist and inquire about this. Continue apixaban.  For now the pt would like to see if rate control would work for her and if not, she is interested in amiodarone.  D/c multaq Increase propanolol to 15 mg bid She has not tolerated carvedilol or metoprolol in the past due to nightmares on both. She has not tolerated Cardizem in the past due to LLE.   She will return in one week after being  rate controlled x one week  and will reevaluate afib control as well as symptoms.   Elvina Sidle [facility] [address] [place], [place] [address] [phone]

## 2015-06-17 ENCOUNTER — Telehealth: Payer: Self-pay | Admitting: Cardiology

## 2015-06-17 NOTE — Telephone Encounter (Signed)
New Message   Patient c/o Palpitations:  High priority if patient c/o lightheadedness and shortness of breath.  1. How long have you been having palpitations? 06-10-15  2. Are you currently experiencing lightheadedness and shortness of breath?   NO  3. Have you checked your BP and heart rate? (document readings) HR YESTERDAY    95 TO 101  4. Are you experiencing any other symptoms?A FIB   Call before 9:30 am or 11:20 am or after

## 2015-06-17 NOTE — Telephone Encounter (Signed)
Spoke with pt's daughter who reports a call on Monday would be fine.  She will not be available from 1:30-2:30 on Monday. All other times are OK

## 2015-06-17 NOTE — Telephone Encounter (Signed)
Patient's dtr is calling to speak with Dr. Shirlee Latch about visit w/ Rudi Coco, NP on Tuesday.  Donna's note states: 1.Persistent afib ERAF s/p cardioversion 9/1 Discussed at length options going forward amiodarone or Tikosyn vrs rate control. Multiple questions answered for the pt and daughters. There is some concern with macular degeneration if amiodarone may worsen eye site. The daughter is to call pt ophthalmologist and inquire about this. Continue apixaban.  For now the pt would like to see if rate control would work for her and if not, she is interested in amiodarone.  D/c multaq Increase propanolol to 15 mg bid She has not tolerated carvedilol or metoprolol in the past due to nightmares on both. She has not tolerated Cardizem in the past due to LLE.  She will return in one week after being rate controlled x one week and will reevaluate afib control as well as symptoms.  Patient is concerned about SE of both drugs and trust Dr. Alford Highland opinion. She would like a call back "to discuss both medications and their high SE with him", in order to make the most informed decision possible.  They did speak with patient's eye doctor, Dr. Carlynn Purl, who stated that Amiodarone would be challenging being that patient is only using one eye primarily. But he said that he would not step over the cardiology advisement.  They would like Dr. Alford Highland opinion/advise on both medications.  They are also asking if another DCCV is an option, to see if she might stay in rhythm if repeat it. She and I had a discussion about what warranted DCCV. Dtr is concerned about mother "staying in AFib".  We discussed this and I relieved her concern of this. Pt's BP is running in lower 100s, but so far patient is not symptomatic of this.  Advised to monitor BP until follow up with Lupita Leash next week. We had a 30 minute discussion about all of this and I tried to explain/alleviate her concerns.  She was extremely thankful for being so  helpful and informative. Would like a call back from Pound or Dewayne Hatch, his nurse, to discuss what his opinion/recommendation is.  Please call back after 11:30 am today.

## 2015-06-17 NOTE — Telephone Encounter (Signed)
Left message to call back  

## 2015-06-17 NOTE — Telephone Encounter (Signed)
I am actually out on vacation the rest of this week.  Will try to call her back Monday if that is ok.  I do not think that DCCV by itself will be useful, will need either Tikosyn or amiodarone and will need to discuss the 2 options.

## 2015-06-18 LAB — CBC
HEMATOCRIT: 36.9 % (ref 36.0–46.0)
HEMOGLOBIN: 12.4 g/dL (ref 12.0–15.0)
MCH: 29.5 pg (ref 26.0–34.0)
MCHC: 33.6 g/dL (ref 30.0–36.0)
MCV: 87.6 fL (ref 78.0–100.0)
MPV: 9.7 fL (ref 8.6–12.4)
PLATELETS: 244 10*3/uL (ref 150–400)
RBC: 4.21 MIL/uL (ref 3.87–5.11)
RDW: 14.3 % (ref 11.5–15.5)
WBC: 4.9 10*3/uL (ref 4.0–10.5)

## 2015-06-23 ENCOUNTER — Encounter (HOSPITAL_COMMUNITY): Payer: Self-pay | Admitting: Nurse Practitioner

## 2015-06-23 ENCOUNTER — Ambulatory Visit (HOSPITAL_COMMUNITY)
Admission: RE | Admit: 2015-06-23 | Discharge: 2015-06-23 | Disposition: A | Discharge status: Home / Self Care | Payer: Medicare Other | Source: Ambulatory Visit | Attending: Nurse Practitioner | Admitting: Nurse Practitioner

## 2015-06-23 VITALS — BP 126/74 | HR 92 | Ht 64.0 in | Wt 150.6 lb

## 2015-06-23 DIAGNOSIS — I4819 Other persistent atrial fibrillation: Secondary | ICD-10-CM

## 2015-06-23 DIAGNOSIS — I481 Persistent atrial fibrillation: Secondary | ICD-10-CM | POA: Diagnosis present

## 2015-06-23 MED ORDER — PROPRANOLOL HCL 10 MG PO TABS: 20.0000 mg | ORAL_TABLET | Freq: Two times a day (BID) | ORAL | Status: DC

## 2015-06-23 NOTE — Patient Instructions (Signed)
Increase inderal to 20mg  twice a day

## 2015-06-23 NOTE — Progress Notes (Signed)
Patient ID: [name], female   DOB: 1928/06/26, 81 y.o.   MRN: [id number]     Primary Care Physician: Lilyan Punt, MD Referring Physician: Dr. Rockey Situ Julie Swanson is a 81 y.o. female with a h/o PAF that has been on multaq and apixaban and has been maintaining sinus rhythm. She was helping her daughter with a yard sale in the recent past  and had worked very hard on  to help get things organized. She was in the heat mid morning when she suddenly became weak, limp and diaphoretic. Her daughter took her inside and cooled her off and got her something to drink. BP cuff showed heart rate of 160 bpm which eventually slowed to less than 100 bpm over a 30 min span. She started feeling stronger and today other than being a little tired, she is at her baseline.She wore a   a 24 hour monitor which showed persistent afib.  She was set up for cardioversion and did return to SR but has had ERAF.  She does have intermittent rectal bleeding, present since the use of DOAC,  worked up with normal colonoscopy and thought to be secondary to internal hemorrhoids. This happens every 4-6 weeks and she will stop her blood thinners for 3 days and resume on the fourth. This just occurred the latter part of July but she has been back on apixaban since 7-31.  She was toll that if this became more of a problem, she could be considered for surgery.  She called afib office last week stating that she had been out of rhythm for several days. Discussed with her and her two daughters, change in antiarrythmic approach, amiodarone vrs tikosyn, discussed as well as rate control. Right now, she does not feel that bad in afib, but would need more rate control if this is her choice. She decided to try rate control and propanolol was increased to 15 mg bid.  She returns today stating that she felt ok over the last week with heart rate more controlled. She can do her usual activities without undo fatigue. No heart failure symptoms.  We discussed all the antiarrythmic's again, Amiodarone would not be an absolute contraindication with her macular degeneration per ophthalmologist but there is some reservation using this drug for potential vision issues.She does not want the expense of tiksoyn and there is some concern with prolonged QT, since some prolongation of QTc was seen with multaq. Flecainide is worrisome in a pt her age with creatinine cl calc at 40, for fear of conduction issues. It is the patient and families wishes to stay with rate control for now. The daughter has researched all the AAD's, and has concerns re them all.  Today, she denies symptoms of palpitations, chest pain, shortness of breath, orthopnea, PND, lower extremity edema, dizziness, presyncope, syncope, or neurologic sequela. Mild fatigue, some lower leg weakness. The patient is tolerating medications without difficulties and is otherwise without complaint today.   Past Medical History  Diagnosis Date  . CAD (coronary artery disease)   . MVP (mitral valve prolapse)   . Palpitations   . HTN (hypertension)   . HLD (hyperlipidemia)   . Hyperkalemia   . Chest pain   . Osteoporosis   . Macular degeneration   . Arthritis   . Atrial fibrillation (HCC)   . Complication of anesthesia     pt states "hard to wake up"   Past Surgical History  Procedure Laterality Date  . Hysterectomy -  unknown type    . Abdominal hysterectomy    . Cystectomy      BREAST  . Cataract extraction      BOTH EYES  . Colonoscopy    . Colonoscopy N/A 08/14/2014    Procedure: COLONOSCOPY;  Surgeon: Malissa Hippo, MD;  [facility]: [facility];  Service: Endoscopy;  Laterality: N/A;  225  . Cardioversion N/A 05/13/2015    Procedure: CARDIOVERSION;  Surgeon: Laurey Morale, MD;  Location: [facility] ENDOSCOPY;  Service: Cardiovascular;  Laterality: N/A;    Current Outpatient Prescriptions  Medication Sig Dispense Refill  . acetaminophen (TYLENOL) 500 MG tablet Take 500 mg by mouth 2  (two) times daily as needed for moderate pain.    Marland Kitchen alendronate (FOSAMAX) 70 MG tablet Take 1 tablet (70 mg total) by mouth every 7 (seven) days. Take with a full glass of water on an empty stomach. 4 tablet 11  . apixaban (ELIQUIS) 5 MG TABS tablet Take 1 tablet (5 mg total) by mouth 2 (two) times daily. 180 tablet 2  . atorvastatin (LIPITOR) 10 MG tablet Take 1 tablet (10 mg total) by mouth daily. 90 tablet 1  . calcium-vitamin D (OSCAL WITH D) 500-200 MG-UNIT per tablet Take 1 tablet by mouth daily with breakfast.     . cycloSPORINE (RESTASIS) 0.05 % ophthalmic emulsion Place 1 drop into both eyes daily.     Marland Kitchen diltiazem (CARDIZEM) 30 MG tablet Take 1 tablet every 4 hours AS NEEDED for afib HR>100 and BP>100 30 tablet 1  . hydrochlorothiazide (HYDRODIURIL) 25 MG tablet TAKE ONE TABLET BY MOUTH ONCE DAILY 30 tablet 0  . hydrocortisone-pramoxine (PROCTOFOAM HC) rectal foam Place 1 applicator rectally 2 (two) times daily. 10 g 0  . Lutein 20 MG CAPS Take 1 capsule by mouth daily.     . Multiple Vitamins-Minerals (ICAPS) CAPS Take 2 capsules by mouth daily.    . propranolol (INDERAL) 10 MG tablet Take 2 tablets (20 mg total) by mouth 2 (two) times daily.     No current facility-administered medications for this encounter.    Allergies  Allergen Reactions  . Valtrex [Valacyclovir Hcl] Palpitations    Chest pain.  Marland Kitchen Antihistamines, Chlorpheniramine-Type Other (See Comments)    Patient states it makes her feel like she's in "la la land"  . Evista [Raloxifene] Other (See Comments)    Causes knots  . Feldene [Piroxicam] Other (See Comments)    unknown  . Toprol Xl [Metoprolol Tartrate]     Bad dreams  . Tropicamide     Dizziness, lethargic, non-responsive    Social History   Social History  . Marital Status: Widowed    Spouse Name: N/A  . Number of Children: N/A  . Years of Education: N/A   Occupational History  . Not on file.   Social History Main Topics  . Smoking status: Never  Smoker   . Smokeless tobacco: Never Used  . Alcohol Use: No  . Drug Use: No  . Sexual Activity: Not on file   Other Topics Concern  . Not on file   Social History Narrative    Family History  Problem Relation Age of Onset  . Stroke    . Heart attack    . Heart disease Mother   . Stroke Mother     ane melanoma  . Heart disease Father   . Bladder Cancer Father   . Cancer Brother   . CAD Brother     ALL  .  CAD Sister     X2    ROS- All systems are reviewed and negative except as per the HPI above  Physical Exam: Filed Vitals:   06/23/15 0846  BP: 126/74  Pulse: 92  Height: 5\' 4"  (1.626 m)  Weight: 150 lb 9.6 oz (68.312 kg)    GEN- The patient is well appearing, alert and oriented x 3 today.   Head- normocephalic, atraumatic Eyes-  Sclera clear, conjunctiva pink Ears- hearing intact Oropharynx- clear Neck- supple, no JVP Lymph- no cervical lymphadenopathy Lungs- Clear to ausculation bilaterally, normal work of breathing Heart- Irregular rate and rhythm, no murmurs, rubs or gallops, PMI not laterally displaced GI- soft, NT, ND, + BS Extremities- no clubbing, cyanosis, or edema MS- no significant deformity or atrophy Skin- no rash or lesion Psych- euthymic mood, full affect Neuro- strength and sensation are intact  EKG- EKG today shows afib at 96 bpm, QRS int 86 ms, QTc 452 ms Epic records reviewed  Assessment and Plan:  1.Persistent afib ERAF s/p cardioversion 9/1  Discussed again  at length options going forward amiodarone/ Tikosyn/ flecainide vrs rate control. Multiple questions answered for the pt and daughters.  Continue apixaban.  For now the pt/family  would like to continue  rate control . Increase propanolol to 20 mg bid for additional rate control She has not tolerated carvedilol or metoprolol in the past due to nightmares on both. She has not tolerated Cardizem in the past due to LLE.   She will return in one-two weeks  and will reevaluate afib  control as well as symptoms. Dr. Shirlee LatchMcLean was in to see pt and shared in the decision making.  I spent at least 30 mins in face to face discussion with pt and her family.   Elvina Sidleonna C. Matthew Folksarroll, ANP-C [facility] [address] [place], [place] [address] [phone]

## 2015-06-28 ENCOUNTER — Other Ambulatory Visit: Payer: Self-pay

## 2015-06-28 DIAGNOSIS — Z1231 Encounter for screening mammogram for malignant neoplasm of breast: Secondary | ICD-10-CM

## 2015-07-01 ENCOUNTER — Ambulatory Visit (HOSPITAL_COMMUNITY)
Admission: RE | Admit: 2015-07-01 | Discharge: 2015-07-01 | Disposition: A | Discharge status: Home / Self Care | Payer: Medicare Other | Source: Ambulatory Visit | Attending: Nurse Practitioner | Admitting: Nurse Practitioner

## 2015-07-01 ENCOUNTER — Other Ambulatory Visit: Payer: Self-pay

## 2015-07-01 VITALS — BP 114/72 | HR 92 | Ht 62.0 in | Wt 153.4 lb

## 2015-07-01 DIAGNOSIS — I4819 Other persistent atrial fibrillation: Secondary | ICD-10-CM

## 2015-07-01 DIAGNOSIS — I481 Persistent atrial fibrillation: Secondary | ICD-10-CM | POA: Diagnosis present

## 2015-07-01 NOTE — Progress Notes (Signed)
Patient ID: Julie Swanson, female   DOB: 1928/05/23, 81 y.o.   MRN: [id number]     Primary Care Physician: Lilyan Punt, MD Referring Physician: Dr. Rockey Situ Julie Swanson is a 81 y.o. female with a h/o PAF that has been on multaq and apixaban and has been maintaining sinus rhythm. She was helping her daughter with a yard sale in the recent past  and had worked very hard on  to help get things organized. She was in the heat mid morning when she suddenly became weak, limp and diaphoretic. Her daughter took her inside and cooled her off and got her something to drink. BP cuff showed heart rate of 160 bpm which eventually slowed to less than 100 bpm over a 30 min span. She started feeling stronger and today other than being a little tired, she is at her baseline.She wore a   a 24 hour monitor which showed persistent afib.  She was set up for cardioversion and did return to SR but has had ERAF.  She does have intermittent rectal bleeding, present since the use of DOAC,  worked up with normal colonoscopy and thought to be secondary to internal hemorrhoids. This happens every 4-6 weeks and she will stop her blood thinners for 3 days and resume on the fourth. This just occurred the latter part of July but she has been back on apixaban since 7-31.  She was toll that if this became more of a problem, she could be considered for surgery.  She called afib office last week stating that she had been out of rhythm for several days. Discussed with her and her two daughters, change in antiarrythmic approach, amiodarone vrs tikosyn, discussed as well as rate control. Right now, she does not feel that bad in afib, but would need more rate control if this is her choice. She decided to try rate control and propanolol was increased to 15 mg bid.  She returns today stating that she felt ok over the last week with heart rate more controlled. She can do her usual activities without undo fatigue. No heart failure symptoms.  We discussed all the antiarrythmic's again, Amiodarone would not be an absolute contraindication with her macular degeneration per ophthalmologist but there is some reservation using this drug for potential vision issues.She does not want the expense of tiksoyn and there is some concern with prolonged QT, since some prolongation of QTc was seen with multaq. Flecainide is worrisome in a pt her age with creatinine cl calc at 40, for fear of conduction issues. It is the patient and families wishes to stay with rate control for now. The daughter has researched all the AAD's, and has concerns re them all.  On last visit, 10/12, she decided to continue rate control, and propanolol was increased to 20 mg bid. Overall,  Or retrun to afib clinic today, she feels well , but does admit that she thinks she feels  better in SR. The family is leaving for the beach tomorrow x one week and she does not want to change her approach for now, but she will entertain use of  amiodarone when she returns to town.  Today, she denies symptoms of palpitations, chest pain, shortness of breath, orthopnea, PND, lower extremity edema, dizziness, presyncope, syncope, or neurologic sequela. Mild fatigue, some lower leg weakness. The patient is tolerating medications without difficulties and is otherwise without complaint today.   Past Medical History  Diagnosis Date  . CAD (coronary artery  disease)   . MVP (mitral valve prolapse)   . Palpitations   . HTN (hypertension)   . HLD (hyperlipidemia)   . Hyperkalemia   . Chest pain   . Osteoporosis   . Macular degeneration   . Arthritis   . Atrial fibrillation (HCC)   . Complication of anesthesia     pt states "hard to wake up"   Past Surgical History  Procedure Laterality Date  . Hysterectomy - unknown type    . Abdominal hysterectomy    . Cystectomy      BREAST  . Cataract extraction      BOTH EYES  . Colonoscopy    . Colonoscopy N/A 08/14/2014    Procedure: COLONOSCOPY;   Surgeon: Malissa HippoNajeeb U Rehman, MD;  [facility]: [facility];  Service: Endoscopy;  Laterality: N/A;  225  . Cardioversion N/A 05/13/2015    Procedure: CARDIOVERSION;  Surgeon: Laurey Moralealton S McLean, MD;  [facility];  Service: Cardiovascular;  Laterality: N/A;    Current Outpatient Prescriptions  Medication Sig Dispense Refill  . acetaminophen (TYLENOL) 500 MG tablet Take 500 mg by mouth 2 (two) times daily as needed for moderate pain.    Marland Kitchen. alendronate (FOSAMAX) 70 MG tablet Take 1 tablet (70 mg total) by mouth every 7 (seven) days. Take with a full glass of water on an empty stomach. 4 tablet 11  . apixaban (ELIQUIS) 5 MG TABS tablet Take 1 tablet (5 mg total) by mouth 2 (two) times daily. 180 tablet 2  . atorvastatin (LIPITOR) 10 MG tablet Take 1 tablet (10 mg total) by mouth daily. 90 tablet 1  . calcium-vitamin D (OSCAL WITH D) 500-200 MG-UNIT per tablet Take 1 tablet by mouth daily with breakfast.     . cycloSPORINE (RESTASIS) 0.05 % ophthalmic emulsion Place 1 drop into both eyes daily.     Marland Kitchen. diltiazem (CARDIZEM) 30 MG tablet Take 1 tablet every 4 hours AS NEEDED for afib HR>100 and BP>100 30 tablet 1  . hydrochlorothiazide (HYDRODIURIL) 25 MG tablet TAKE ONE TABLET BY MOUTH ONCE DAILY 30 tablet 0  . hydrocortisone-pramoxine (PROCTOFOAM HC) rectal foam Place 1 applicator rectally 2 (two) times daily. 10 g 0  . Lutein 20 MG CAPS Take 1 capsule by mouth daily.     . Multiple Vitamins-Minerals (ICAPS) CAPS Take 2 capsules by mouth daily.    . propranolol (INDERAL) 10 MG tablet Take 2 tablets (20 mg total) by mouth 2 (two) times daily.     No current facility-administered medications for this encounter.    Allergies  Allergen Reactions  . Valtrex [Valacyclovir Hcl] Palpitations    Chest pain.  Marland Kitchen. Antihistamines, Chlorpheniramine-Type Other (See Comments)    Patient states it makes her feel like she's in "la la land"  . Evista [Raloxifene] Other (See Comments)    Causes knots  . Feldene  [Piroxicam] Other (See Comments)    unknown  . Toprol Xl [Metoprolol Tartrate]     Bad dreams  . Tropicamide     Dizziness, lethargic, non-responsive    Social History   Social History  . Marital Status: Widowed    Spouse Name: N/A  . Number of Children: N/A  . Years of Education: N/A   Occupational History  . Not on file.   Social History Main Topics  . Smoking status: Never Smoker   . Smokeless tobacco: Never Used  . Alcohol Use: No  . Drug Use: No  . Sexual Activity: Not on file  Other Topics Concern  . Not on file   Social History Narrative    Family History  Problem Relation Age of Onset  . Stroke    . Heart attack    . Heart disease Mother   . Stroke Mother     ane melanoma  . Heart disease Father   . Bladder Cancer Father   . Cancer Brother   . CAD Brother     ALL  . CAD Sister     X2    ROS- All systems are reviewed and negative except as per the HPI above  Physical Exam: Filed Vitals:   07/01/15 0854  BP: 114/72  Pulse: 92  Height:  (1.575 m)  Weight: 153 lb 6.4 oz (69.582 kg)    GEN- The patient is well appearing, alert and oriented x 3 today.   Head- normocephalic, atraumatic Eyes-  Sclera clear, conjunctiva pink Ears- hearing intact Oropharynx- clear Neck- supple, no JVP Lymph- no cervical lymphadenopathy Lungs- Clear to ausculation bilaterally, normal work of breathing Heart- Irregular rate and rhythm, no murmurs, rubs or gallops, PMI not laterally displaced GI- soft, NT, ND, + BS Extremities- no clubbing, cyanosis, or edema MS- no significant deformity or atrophy Skin- no rash or lesion Psych- euthymic mood, full affect Neuro- strength and sensation are intact  EKG- EKG today shows afib at 96 bpm, QRS int 86 ms, QTc 452 ms Epic records reviewed  Assessment and Plan:  1.Persistent afib ERAF s/p cardioversion 9/1  Discussed again  at length options going forward amiodarone/ Tikosyn/ flecainide vrs rate control.  Multiple questions answered for the pt and daughters.  Continue apixaban.  For now the pt/family  would like to continue  rate control,  while out of town for a beach trip for the next week, but on return she will reconsider use of antiarrythmic's, amiodarone was the drug pt/family discussed . Continue propanolol to 20 mg bid for additional rate control She has not tolerated carvedilol or metoprolol in the past due to nightmares on both. She has not tolerated Cardizem in the past due to LLE.   She will f/u with Dr. Shirlee Latch in the next 2-3 weeks.    Elvina Sidle Matthew Folks Afib [facility] [address] [place], [place] [address] [phone]

## 2015-07-02 ENCOUNTER — Ambulatory Visit: Payer: 59 | Admitting: Cardiology

## 2015-07-08 NOTE — Telephone Encounter (Signed)
Left message to call back  

## 2015-07-08 NOTE — Telephone Encounter (Signed)
Scheduled patient with Dr. Shirlee LatchMcLean on 07/20/15 at 1:30 pm.

## 2015-07-08 NOTE — Telephone Encounter (Signed)
F/u  Pt daughter returning phone call

## 2015-07-08 NOTE — Telephone Encounter (Signed)
F/u ° ° ° °Pt's daughter returning your call °

## 2015-07-08 NOTE — Telephone Encounter (Signed)
New message  Pt daughter called and states that the time offered on 11/08 @ 3:30p is not a good day. They are asking for the week of 11/07. Any day and any time. Please assist. Per the notes the pt will have to see Dr. Shirlee LatchMclean only.

## 2015-07-14 ENCOUNTER — Other Ambulatory Visit: Payer: Self-pay | Admitting: Family Medicine

## 2015-07-20 ENCOUNTER — Encounter: Payer: Self-pay | Admitting: *Deleted

## 2015-07-20 ENCOUNTER — Ambulatory Visit (INDEPENDENT_AMBULATORY_CARE_PROVIDER_SITE_OTHER): Payer: Medicare Other | Admitting: Cardiology

## 2015-07-20 ENCOUNTER — Encounter: Payer: Self-pay | Admitting: Cardiology

## 2015-07-20 VITALS — BP 120/80 | HR 102 | Ht 64.5 in | Wt 150.0 lb

## 2015-07-20 DIAGNOSIS — I480 Paroxysmal atrial fibrillation: Secondary | ICD-10-CM

## 2015-07-20 DIAGNOSIS — K625 Hemorrhage of anus and rectum: Secondary | ICD-10-CM | POA: Diagnosis not present

## 2015-07-20 DIAGNOSIS — I6523 Occlusion and stenosis of bilateral carotid arteries: Secondary | ICD-10-CM

## 2015-07-20 DIAGNOSIS — E785 Hyperlipidemia, unspecified: Secondary | ICD-10-CM | POA: Diagnosis not present

## 2015-07-20 MED ORDER — AMIODARONE HCL 200 MG PO TABS: ORAL_TABLET | ORAL | Status: DC

## 2015-07-20 NOTE — Patient Instructions (Signed)
Medication Instructions:  Start amiodarone 200mg  two times a day for 2 weeks, then decrease to amiodarone 200mg  daily  Labwork: Your physician recommends that you return for lab work in: 2 weeks--Liver profile/CBCd/TSH   Testing/Procedures:  Your physician has recommended that you have a pulmonary function test. Pulmonary Function Tests are a group of tests that measure how well air moves in and out of your lungs.    Your physician has recommended that you have a Cardioversion (DCCV). Electrical Cardioversion uses a jolt of electricity to your heart either through paddles or wired patches attached to your chest. This is a controlled, usually prescheduled, procedure. Defibrillation is done under light anesthesia in the hospital, and you usually go home the day of the procedure. This is done to get your heart back into a normal rhythm. You are not awake for the procedure. Please see the instruction sheet given to you today. Friday December 2,2016    Follow-Up: Your physician recommends that you schedule a follow-up appointment in: 1 month with Dr Shirlee LatchMcLean.         If you need a refill on your cardiac medications before your next appointment, please call your pharmacy.

## 2015-07-20 NOTE — Progress Notes (Signed)
Patient ID: Julie Swanson, female   DOB: 10/03/1927, 81 y.o.   MRN: [id number] PCP: Dr. [name]  81 yo presents for followup of paroxysmal atrial fibrillation.  She had been having runs of rapid tachypalpitations when I saw her initially.  She was quite symptomatic with episodes.  Event monitor showed runs of atrial fibrillation with RVR.  I started her on Multaq for rhythm control given her significant symptoms and on apixaban.  She did not tolerate diltiazem due to ankle swelling and was switched over to Coreg for rate control.  She did not tolerate Coreg and is back on propranolol, which she does ok with.  Echo in 11/14 showed EF 60-65% with mild MR. No exertional chest pain or dyspnea. No lightheadedness.  She went into atrial fibrillation again in 8/16.  She had DCCV in 9/16 back to NSR and was continued on Multaq.  However, she went back into atrial fibrillation shortly after on despite Multaq use.  Multaq was stopped at her last atrial fibrillation clinic visit.  She remains in atrial fibrillation today with HR 102. She feels tired and weak since going back into atrial fibrillation.  No energy, not walking much.  Not short of breath, per se.  No chest pain.    She had been having episodes of bright red blood per rectum likely from hemorrhoids.  These have been injected and she has had no further bleeding. She has not missed any Eliquis doses.   ECG: atrial fibrillation at 102 with nonspecific T wave flattening.    Labs (10/14): LDL 90, HDL 41, K 4.1, creatinine 0.9 Labs (4/15): LDL 74, HDL 40, K 4.1, creatinine 0.87 Labs (10/15): LDL 73, HDL 38 Labs (12/15): K 4, creatinine 0.9, HCT 36 Labs (8/16): K 4, creatinine 0.97, HCT 37.8, TSH normal Labs (9/16): K 4.6, creatinine 1.06, LDL 68, HDL 47  PMH: 1. LHC (8/10) with minimal nonobstructive disease.  ETT (10/13) with 2'53" exercise, nonspecific ECG changes with no evidence for ischemia.  2. Mitral valve prolapse: Echo (11/14) with EF  60-65%, mild MR.  3. Atrial fibrillation:  Event monitor (10/13) with PACs only.  Event monitor (11/14) showed atrial fibrillation with RVR. She does not tolerate metoprolol (nightmares).  She had ankle swelling with diltiazem. Possible fatigue/diarrhea with Coreg. Holter (8/16) with persistent atrial fibrillation.  DCCV to NSR in 9/16.  Back in atrial fibrillation by 10/16 despite Multaq.  4. HTN 5. Hyperlipidemia 6. Hysterectomy. 7. Carotid disease: Carotid dopplers (1/15) with prominent plaque, mild stenosis.  8. Internal hemorrhoids with episodic rectal bleeding.  9. Macular degeneration.   SH: Widow, lives in [place], nonsmoker.  2 daughters.   FH: Mother with CVA, CAD.  Father with CAD.   ROS: All systems reviewed and negative except as per HPI.   Current Outpatient Prescriptions  Medication Sig Dispense Refill  . acetaminophen (TYLENOL) 500 MG tablet Take 500 mg by mouth 2 (two) times daily as needed for moderate pain.    . alendronate (FOSAMAX) 70 MG tablet Take 1 tablet (70 mg total) by mouth every 7 (seven) days. Take with a full glass of water on an empty stomach. 4 tablet 11  . apixaban (ELIQUIS) 5 MG TABS tablet Take 1 tablet (5 mg total) by mouth 2 (two) times daily. 180 tablet 2  . atorvastatin (LIPITOR) 10 MG tablet Take 1 tablet (10 mg total) by mouth daily. 90 tablet 1  . calcium-vitamin D (OSCAL WITH D) 500-200 MG-UNIT per tablet Take 1   tablet by mouth daily with breakfast.     . cycloSPORINE (RESTASIS) 0.05 % ophthalmic emulsion Place 1 drop into both eyes daily.     Marland Kitchen. diltiazem (CARDIZEM) 30 MG tablet Take 1 tablet every 4 hours AS NEEDED for afib HR>100 and BP>100 30 tablet 1  . hydrochlorothiazide (HYDRODIURIL) 25 MG tablet TAKE ONE TABLET BY MOUTH ONCE DAILY 30 tablet 3  . hydrocortisone-pramoxine (PROCTOFOAM HC) rectal foam Place 1 applicator rectally 2 (two) times daily. 10 g 0  . Lutein 20 MG CAPS Take 1 capsule by mouth daily.     . Multiple Vitamins-Minerals  (ICAPS) CAPS Take 2 capsules by mouth daily.    . propranolol (INDERAL) 20 MG tablet Take 20 mg by mouth 2 (two) times daily.     Marland Kitchen. amiodarone (PACERONE) 200 MG tablet 1 tablet by mouth two times a day for 2 weeks, then decrease to 1 tablet by mouth daily 60 tablet 2   No current facility-administered medications for this visit.    BP 120/80 mmHg  Pulse 102  Ht 5' 4.5" (1.638 m)  Wt 150 lb (68.04 kg)  BMI 25.36 kg/m2 General: NAD Neck: No JVD, no thyromegaly or thyroid nodule.  Lungs: Clear to auscultation bilaterally with normal respiratory effort. CV: Nondisplaced PMI.  Heart mildly tachy, irregular S1/S2, no S3/S4, no murmur.  1+ ankle edema. Right carotid bruit. Normal pedal pulses.  Abdomen: Soft, nontender, no hepatosplenomegaly, no distention.  Skin: Intact without lesions or rashes.  Neurologic: Alert and oriented x 3.  Psych: Normal affect. Extremities: No clubbing or cyanosis.   Assessment/Plan: 1. Atrial fibrillation:  Persistent.  She tolerates atrial fibrillation poorly with significantly increased fatigue. She has failed Multaq.  We discussed other options today.  She feels much better in NSR.   - I am going to start her on amiodarone 200 mg bid x 2 weeks then 200 mg daily.  She has macular degeneration so will need regular eye exams on amiodarone, she is aware of this.  Will follow LFTs/TSH (check in 2 wks) and will get baseline PFTs.  - DCCV after 2 weeks of amiodarone.   - Continue Eliquis, she has not missed any doses.  - Continue propranolol 20 mg bid.  She has not tolerated diltiazem CD or other beta blockers.   - Rectal bleeding resolved with injection of hemorrhoids. 2. HTN:  BP controlled.   3. Hyperlipidemia: Good lipids in 9/16.  4. Carotid bruit: Mild stenosis only.    Marca AnconaDalton Mansa Willers 07/20/2015

## 2015-07-27 ENCOUNTER — Ambulatory Visit
Admission: RE | Admit: 2015-07-27 | Discharge: 2015-07-27 | Disposition: A | Discharge status: Home / Self Care | Payer: Medicare Other | Source: Ambulatory Visit

## 2015-07-27 DIAGNOSIS — Z1231 Encounter for screening mammogram for malignant neoplasm of breast: Secondary | ICD-10-CM

## 2015-07-28 ENCOUNTER — Ambulatory Visit: Payer: Medicare Other

## 2015-08-02 ENCOUNTER — Ambulatory Visit (HOSPITAL_COMMUNITY)
Admission: RE | Admit: 2015-08-02 | Discharge: 2015-08-02 | Disposition: A | Discharge status: Home / Self Care | Payer: Medicare Other | Source: Ambulatory Visit | Attending: Cardiology | Admitting: Cardiology

## 2015-08-02 ENCOUNTER — Other Ambulatory Visit (INDEPENDENT_AMBULATORY_CARE_PROVIDER_SITE_OTHER): Payer: Medicare Other | Admitting: *Deleted

## 2015-08-02 DIAGNOSIS — R0609 Other forms of dyspnea: Secondary | ICD-10-CM | POA: Diagnosis not present

## 2015-08-02 DIAGNOSIS — I480 Paroxysmal atrial fibrillation: Secondary | ICD-10-CM

## 2015-08-02 DIAGNOSIS — R05 Cough: Secondary | ICD-10-CM | POA: Diagnosis not present

## 2015-08-02 LAB — PULMONARY FUNCTION TEST
DL/VA % pred: 69 %
DL/VA: 3.26 ml/min/mmHg/L
DLCO UNC: 14.37 ml/min/mmHg
DLCO unc % pred: 62 %
FEF 25-75 Post: 1.46 L/sec
FEF 25-75 Pre: 1.15 L/sec
FEF2575-%CHANGE-POST: 26 %
FEF2575-%PRED-POST: 138 %
FEF2575-%Pred-Pre: 109 %
FEV1-%CHANGE-POST: 6 %
FEV1-%Pred-Post: 112 %
FEV1-%Pred-Pre: 106 %
FEV1-POST: 1.85 L
FEV1-PRE: 1.74 L
FEV1FVC-%Change-Post: 3 %
FEV1FVC-%Pred-Pre: 97 %
FEV6-%Change-Post: 3 %
FEV6-%PRED-POST: 119 %
FEV6-%Pred-Pre: 115 %
FEV6-PRE: 2.42 L
FEV6-Post: 2.5 L
FEV6FVC-%CHANGE-POST: 0 %
FEV6FVC-%PRED-PRE: 104 %
FEV6FVC-%Pred-Post: 105 %
FVC-%CHANGE-POST: 2 %
FVC-%PRED-POST: 112 %
FVC-%Pred-Pre: 110 %
FVC-Post: 2.52 L
FVC-Pre: 2.46 L
PRE FEV6/FVC RATIO: 99 %
Post FEV1/FVC ratio: 74 %
Post FEV6/FVC ratio: 99 %
Pre FEV1/FVC ratio: 71 %
RV % PRED: 90 %
RV: 2.22 L
TLC % pred: 95 %
TLC: 4.69 L

## 2015-08-02 LAB — CBC WITH DIFFERENTIAL/PLATELET
BASOS ABS: 0 10*3/uL (ref 0.0–0.1)
BASOS PCT: 0 % (ref 0–1)
EOS ABS: 0.2 10*3/uL (ref 0.0–0.7)
Eosinophils Relative: 3 % (ref 0–5)
HCT: 34.1 % — ABNORMAL LOW (ref 36.0–46.0)
Hemoglobin: 11.5 g/dL — ABNORMAL LOW (ref 12.0–15.0)
Lymphocytes Relative: 17 % (ref 12–46)
Lymphs Abs: 1.1 10*3/uL (ref 0.7–4.0)
MCH: 29.9 pg (ref 26.0–34.0)
MCHC: 33.7 g/dL (ref 30.0–36.0)
MCV: 88.8 fL (ref 78.0–100.0)
MPV: 9.6 fL (ref 8.6–12.4)
Monocytes Absolute: 0.7 10*3/uL (ref 0.1–1.0)
Monocytes Relative: 10 % (ref 3–12)
NEUTROS PCT: 70 % (ref 43–77)
Neutro Abs: 4.6 10*3/uL (ref 1.7–7.7)
PLATELETS: 223 10*3/uL (ref 150–400)
RBC: 3.84 MIL/uL — AB (ref 3.87–5.11)
RDW: 14 % (ref 11.5–15.5)
WBC: 6.5 10*3/uL (ref 4.0–10.5)

## 2015-08-02 LAB — HEPATIC FUNCTION PANEL
ALBUMIN: 3.8 g/dL (ref 3.6–5.1)
ALK PHOS: 67 U/L (ref 33–130)
ALT: 23 U/L (ref 6–29)
AST: 22 U/L (ref 10–35)
BILIRUBIN DIRECT: 0.2 mg/dL (ref <=0.2)
BILIRUBIN INDIRECT: 0.5 mg/dL (ref 0.2–1.2)
BILIRUBIN TOTAL: 0.7 mg/dL (ref 0.2–1.2)
Total Protein: 6.2 g/dL (ref 6.1–8.1)

## 2015-08-02 LAB — TSH: TSH: 4.232 u[IU]/mL (ref 0.350–4.500)

## 2015-08-02 MED ORDER — LEVALBUTEROL HCL 0.63 MG/3ML IN NEBU
0.6300 mg | INHALATION_SOLUTION | Freq: Once | RESPIRATORY_TRACT | Status: AC
  Administered 2015-08-02: 0.63 mg via RESPIRATORY_TRACT

## 2015-08-09 ENCOUNTER — Encounter: Payer: Self-pay | Admitting: Family Medicine

## 2015-08-09 ENCOUNTER — Ambulatory Visit (INDEPENDENT_AMBULATORY_CARE_PROVIDER_SITE_OTHER): Payer: Medicare Other | Admitting: Family Medicine

## 2015-08-09 VITALS — BP 132/82 | Ht 64.0 in | Wt 149.0 lb

## 2015-08-09 DIAGNOSIS — M810 Age-related osteoporosis without current pathological fracture: Secondary | ICD-10-CM | POA: Diagnosis not present

## 2015-08-09 DIAGNOSIS — J449 Chronic obstructive pulmonary disease, unspecified: Secondary | ICD-10-CM | POA: Insufficient documentation

## 2015-08-09 DIAGNOSIS — I951 Orthostatic hypotension: Secondary | ICD-10-CM

## 2015-08-09 DIAGNOSIS — K5909 Other constipation: Secondary | ICD-10-CM

## 2015-08-09 DIAGNOSIS — I6523 Occlusion and stenosis of bilateral carotid arteries: Secondary | ICD-10-CM

## 2015-08-09 DIAGNOSIS — J438 Other emphysema: Secondary | ICD-10-CM

## 2015-08-09 DIAGNOSIS — I10 Essential (primary) hypertension: Secondary | ICD-10-CM

## 2015-08-09 NOTE — Progress Notes (Signed)
   Subjective:    Patient ID: Carroll SageNancy S Butikofer, female    DOB: Dec 03, 1927, 81 y.o.   MRN: [id number]  HPI Patient arrives for a follow up on fosamax. Patient having some constipation. Patient also scheduled for ekg and cardioversion on Friday for a -fib. Patient has been in a-fib for 2 months and been handling thru cardiology.  Patient overall is doing fairly well but she feels fatigued and run down she thinks its related somewhat to the medications she was put on by cardiology. She also has cardioversion scheduled for this Friday. Review of Systems She denies any chest tightness pressure pain she does relate fatigue and tiredness. No nausea vomiting diarrhea.    Objective:   Physical Exam Lungs are clear heart irregular regular pulse rate is controlled abdomen soft extremities no edema       Assessment & Plan:  Osteoporosis continue Fosamax tolerating well bone density in 2 years  HTN decent control  Fatigue tiredness related to atrial fib the medications  COPD detected on testing that they did for her amiodarone. Patient asymptomatic no treatment indicated  Follow-up in the spring

## 2015-08-13 ENCOUNTER — Ambulatory Visit (HOSPITAL_COMMUNITY): Payer: Medicare Other | Admitting: Certified Registered Nurse Anesthetist

## 2015-08-13 ENCOUNTER — Ambulatory Visit: Payer: Medicare Other

## 2015-08-13 ENCOUNTER — Ambulatory Visit (HOSPITAL_COMMUNITY)
Admission: RE | Admit: 2015-08-13 | Discharge: 2015-08-13 | Disposition: A | Discharge status: Home / Self Care | Payer: Medicare Other | Source: Ambulatory Visit | Attending: Cardiology | Admitting: Cardiology

## 2015-08-13 ENCOUNTER — Encounter (HOSPITAL_COMMUNITY): Payer: Self-pay

## 2015-08-13 ENCOUNTER — Encounter (HOSPITAL_COMMUNITY)
Admission: RE | Disposition: A | Discharge status: Home / Self Care | Payer: Self-pay | Source: Ambulatory Visit | Attending: Cardiology

## 2015-08-13 DIAGNOSIS — I2510 Atherosclerotic heart disease of native coronary artery without angina pectoris: Secondary | ICD-10-CM | POA: Diagnosis not present

## 2015-08-13 DIAGNOSIS — Z90710 Acquired absence of both cervix and uterus: Secondary | ICD-10-CM | POA: Diagnosis not present

## 2015-08-13 DIAGNOSIS — I10 Essential (primary) hypertension: Secondary | ICD-10-CM | POA: Diagnosis not present

## 2015-08-13 DIAGNOSIS — Z7901 Long term (current) use of anticoagulants: Secondary | ICD-10-CM | POA: Diagnosis not present

## 2015-08-13 DIAGNOSIS — I4891 Unspecified atrial fibrillation: Secondary | ICD-10-CM | POA: Diagnosis present

## 2015-08-13 DIAGNOSIS — Z79899 Other long term (current) drug therapy: Secondary | ICD-10-CM | POA: Insufficient documentation

## 2015-08-13 DIAGNOSIS — E785 Hyperlipidemia, unspecified: Secondary | ICD-10-CM | POA: Insufficient documentation

## 2015-08-13 DIAGNOSIS — Z8249 Family history of ischemic heart disease and other diseases of the circulatory system: Secondary | ICD-10-CM | POA: Insufficient documentation

## 2015-08-13 DIAGNOSIS — I341 Nonrheumatic mitral (valve) prolapse: Secondary | ICD-10-CM | POA: Insufficient documentation

## 2015-08-13 DIAGNOSIS — M810 Age-related osteoporosis without current pathological fracture: Secondary | ICD-10-CM | POA: Diagnosis not present

## 2015-08-13 HISTORY — PX: CARDIOVERSION: SHX1299

## 2015-08-13 SURGERY — CARDIOVERSION
Anesthesia: Monitor Anesthesia Care

## 2015-08-13 MED ORDER — SODIUM CHLORIDE 0.9 % IV SOLN
INTRAVENOUS | Status: DC | PRN
  Administered 2015-08-13: 12:00:00 via INTRAVENOUS

## 2015-08-13 MED ORDER — PROPOFOL 10 MG/ML IV BOLUS
INTRAVENOUS | Status: DC | PRN
  Administered 2015-08-13: 40 mg via INTRAVENOUS
  Administered 2015-08-13: 30 mg via INTRAVENOUS

## 2015-08-13 MED ORDER — LIDOCAINE HCL (CARDIAC) 20 MG/ML IV SOLN
INTRAVENOUS | Status: DC | PRN
  Administered 2015-08-13: 60 mg via INTRAVENOUS

## 2015-08-13 NOTE — Transfer of Care (Signed)
Immediate Anesthesia Transfer of Care Note  Patient: Julie Swanson  Procedure(s) Performed: Procedure(s): CARDIOVERSION (N/A)  Patient Location: Endoscopy Unit  Anesthesia Type:MAC  Level of Consciousness: awake, alert , oriented and patient cooperative  Airway & Oxygen Therapy: Patient Spontanous Breathing  Post-op Assessment: Report given to RN and Post -op Vital signs reviewed and stable  Post vital signs: Reviewed and stable  Last Vitals:  Filed Vitals:   08/13/15 1225 08/13/15 1229  BP:  124/65  Pulse: 62 63  Resp: 20 23    Complications: No apparent anesthesia complications

## 2015-08-13 NOTE — Interval H&P Note (Signed)
History and Physical Interval Note:  08/13/2015 12:14 PM  Julie Swanson  has presented today for surgery, with the diagnosis of AFIB  The various methods of treatment have been discussed with the patient and family. After consideration of risks, benefits and other options for treatment, the patient has consented to  Procedure(s): CARDIOVERSION (N/A) as a surgical intervention .  The patient's history has been reviewed, patient examined, no change in status, stable for surgery.  I have reviewed the patient's chart and labs.  Questions were answered to the patient's satisfaction.     Keghan Mcfarren [employer]

## 2015-08-13 NOTE — Discharge Instructions (Signed)
Electrical Cardioversion, Care After °Refer to this sheet in the next few weeks. These instructions provide you with information on caring for yourself after your procedure. Your health care provider may also give you more specific instructions. Your treatment has been planned according to current medical practices, but problems sometimes occur. Call your health care provider if you have any problems or questions after your procedure. °WHAT TO EXPECT AFTER THE PROCEDURE °After your procedure, it is typical to have the following sensations: °· Some redness on the skin where the shocks were delivered. If this is tender, a sunburn lotion or hydrocortisone cream may help. °· Possible return of an abnormal heart rhythm within hours or days after the procedure. °HOME CARE INSTRUCTIONS °· Take medicines only as directed by your health care provider. Be sure you understand how and when to take your medicine. °· Learn how to feel your pulse and check it often. °· Limit your activity for 48 hours after the procedure or as directed by your health care provider. °· Avoid or minimize caffeine and other stimulants as directed by your health care provider. °SEEK MEDICAL CARE IF: °· You feel like your heart is beating too fast or your pulse is not regular. °· You have any questions about your medicines. °· You have bleeding that will not stop. °SEEK IMMEDIATE MEDICAL CARE IF: °· You are dizzy or feel faint. °· It is hard to breathe or you feel short of breath. °· There is a change in discomfort in your chest. °· Your speech is slurred or you have trouble moving an arm or leg on one side of your body. °· You get a serious muscle cramp that does not go away. °· Your fingers or toes turn cold or blue. °  °This information is not intended to replace advice given to you by your health care provider. Make sure you discuss any questions you have with your health care provider. °  °Document Released: 06/18/2013 Document Revised: 09/18/2014  Document Reviewed: 06/18/2013 °Elsevier Interactive Patient Education ©2016 [employer] ° °

## 2015-08-13 NOTE — Anesthesia Preprocedure Evaluation (Signed)
Anesthesia Evaluation  Patient identified by MRN, date of birth, ID band Patient awake    Reviewed: Allergy & Precautions, NPO status , Patient's Chart, lab work & pertinent test results  Airway Mallampati: II   Neck ROM: full    Dental   Pulmonary COPD,    breath sounds clear to auscultation       Cardiovascular hypertension, + CAD  + dysrhythmias Atrial Fibrillation  Rhythm:irregular Rate:Normal     Neuro/Psych    GI/Hepatic   Endo/Other    Renal/GU      Musculoskeletal  (+) Arthritis ,   Abdominal   Peds  Hematology   Anesthesia Other Findings   Reproductive/Obstetrics                             Anesthesia Physical Anesthesia Plan  ASA: III  Anesthesia Plan: MAC   Post-op Pain Management:    Induction: Intravenous  Airway Management Planned: Mask  Additional Equipment:   Intra-op Plan:   Post-operative Plan:   Informed Consent: I have reviewed the patients History and Physical, chart, labs and discussed the procedure including the risks, benefits and alternatives for the proposed anesthesia with the patient or authorized representative who has indicated his/her understanding and acceptance.     Plan Discussed with: CRNA, Anesthesiologist and Surgeon  Anesthesia Plan Comments:         Anesthesia Quick Evaluation

## 2015-08-13 NOTE — H&P (View-Only) (Signed)
Patient ID: Julie Swanson, female   DOB: [birth year], 81 y.o.   MRN: [id number] PCP: Dr. Lilyan PuntScott Luking  81 yo presents for followup of paroxysmal atrial fibrillation.  She had been having runs of rapid tachypalpitations when I saw her initially.  She was quite symptomatic with episodes.  Event monitor showed runs of atrial fibrillation with RVR.  I started her on Multaq for rhythm control given her significant symptoms and on apixaban.  She did not tolerate diltiazem due to ankle swelling and was switched over to Coreg for rate control.  She did not tolerate Coreg and is back on propranolol, which she does ok with.  Echo in 11/14 showed EF 60-65% with mild MR. No exertional chest pain or dyspnea. No lightheadedness.  She went into atrial fibrillation again in 8/16.  She had DCCV in 9/16 back to NSR and was continued on Multaq.  However, she went back into atrial fibrillation shortly after on despite Multaq use.  Multaq was stopped at her last atrial fibrillation clinic visit.  She remains in atrial fibrillation today with HR 102. She feels tired and weak since going back into atrial fibrillation.  No energy, not walking much.  Not short of breath, per se.  No chest pain.    She had been having episodes of bright red blood per rectum likely from hemorrhoids.  These have been injected and she has had no further bleeding. She has not missed any Eliquis doses.   ECG: atrial fibrillation at 102 with nonspecific T wave flattening.    Labs (10/14): LDL 90, HDL 41, K 4.1, creatinine 0.9 Labs (4/15): LDL 74, HDL 40, K 4.1, creatinine 4.090.87 Labs (10/15): LDL 73, HDL 38 Labs (12/15): K 4, creatinine 0.9, HCT 36 Labs (8/16): K 4, creatinine 0.97, HCT 37.8, TSH normal Labs (9/16): K 4.6, creatinine 1.06, LDL 68, HDL 47  PMH: 1. LHC (8/10) with minimal nonobstructive disease.  ETT (10/13) with 2'53" exercise, nonspecific ECG changes with no evidence for ischemia.  2. Mitral valve prolapse: Echo (11/14) with EF  60-65%, mild MR.  3. Atrial fibrillation:  Event monitor (10/13) with PACs only.  Event monitor (11/14) showed atrial fibrillation with RVR. She does not tolerate metoprolol (nightmares).  She had ankle swelling with diltiazem. Possible fatigue/diarrhea with Coreg. Holter (8/16) with persistent atrial fibrillation.  DCCV to NSR in 9/16.  Back in atrial fibrillation by 10/16 despite Multaq.  4. HTN 5. Hyperlipidemia 6. Hysterectomy. 7. Carotid disease: Carotid dopplers (1/15) with prominent plaque, mild stenosis.  8. Internal hemorrhoids with episodic rectal bleeding.  9. Macular degeneration.   SH: Widow, [place], nonsmoker.  2 daughters.   FH: Mother with CVA, CAD.  Father with CAD.   ROS: All systems reviewed and negative except as per HPI.   Current Outpatient Prescriptions  Medication Sig Dispense Refill  . acetaminophen (TYLENOL) 500 MG tablet Take 500 mg by mouth 2 (two) times daily as needed for moderate pain.    Marland Kitchen. alendronate (FOSAMAX) 70 MG tablet Take 1 tablet (70 mg total) by mouth every 7 (seven) days. Take with a full glass of water on an empty stomach. 4 tablet 11  . apixaban (ELIQUIS) 5 MG TABS tablet Take 1 tablet (5 mg total) by mouth 2 (two) times daily. 180 tablet 2  . atorvastatin (LIPITOR) 10 MG tablet Take 1 tablet (10 mg total) by mouth daily. 90 tablet 1  . calcium-vitamin D (OSCAL WITH D) 500-200 MG-UNIT per tablet Take 1  tablet by mouth daily with breakfast.     . cycloSPORINE (RESTASIS) 0.05 % ophthalmic emulsion Place 1 drop into both eyes daily.     Marland Kitchen. diltiazem (CARDIZEM) 30 MG tablet Take 1 tablet every 4 hours AS NEEDED for afib HR>100 and BP>100 30 tablet 1  . hydrochlorothiazide (HYDRODIURIL) 25 MG tablet TAKE ONE TABLET BY MOUTH ONCE DAILY 30 tablet 3  . hydrocortisone-pramoxine (PROCTOFOAM HC) rectal foam Place 1 applicator rectally 2 (two) times daily. 10 g 0  . Lutein 20 MG CAPS Take 1 capsule by mouth daily.     . Multiple Vitamins-Minerals  (ICAPS) CAPS Take 2 capsules by mouth daily.    . propranolol (INDERAL) 20 MG tablet Take 20 mg by mouth 2 (two) times daily.     Marland Kitchen. amiodarone (PACERONE) 200 MG tablet 1 tablet by mouth two times a day for 2 weeks, then decrease to 1 tablet by mouth daily 60 tablet 2   No current facility-administered medications for this visit.    BP 120/80 mmHg  Pulse 102  Ht 5' 4.5" (1.638 m)  Wt 150 lb (68.04 kg)  BMI 25.36 kg/m2 General: NAD Neck: No JVD, no thyromegaly or thyroid nodule.  Lungs: Clear to auscultation bilaterally with normal respiratory effort. CV: Nondisplaced PMI.  Heart mildly tachy, irregular S1/S2, no S3/S4, no murmur.  1+ ankle edema. Right carotid bruit. Normal pedal pulses.  Abdomen: Soft, nontender, no hepatosplenomegaly, no distention.  Skin: Intact without lesions or rashes.  Neurologic: Alert and oriented x 3.  Psych: Normal affect. Extremities: No clubbing or cyanosis.   Assessment/Plan: 1. Atrial fibrillation:  Persistent.  She tolerates atrial fibrillation poorly with significantly increased fatigue. She has failed Multaq.  We discussed other options today.  She feels much better in NSR.   - I am going to start her on amiodarone 200 mg bid x 2 weeks then 200 mg daily.  She has macular degeneration so will need regular eye exams on amiodarone, she is aware of this.  Will follow LFTs/TSH (check in 2 wks) and will get baseline PFTs.  - DCCV after 2 weeks of amiodarone.   - Continue Eliquis, she has not missed any doses.  - Continue propranolol 20 mg bid.  She has not tolerated diltiazem CD or other beta blockers.   - Rectal bleeding resolved with injection of hemorrhoids. 2. HTN:  BP controlled.   3. Hyperlipidemia: Good lipids in 9/16.  4. Carotid bruit: Mild stenosis only.    Marca AnconaDalton Casin Federici 07/20/2015

## 2015-08-13 NOTE — Procedures (Signed)
Electrical Cardioversion Procedure Note Julie Sageancy S Nessler [id number] 1928/05/29  Procedure: Electrical Cardioversion Indications:  Atrial Fibrillation.  She has been on Eliquis > 1 month without missing a dose.   Procedure Details Consent: Risks of procedure as well as the alternatives and risks of each were explained to the (patient/caregiver).  Consent for procedure obtained. Time Out: Verified patient identification, verified procedure, site/side was marked, verified correct patient position, special equipment/implants available, medications/allergies/relevent history reviewed, required imaging and test results available.  Performed  Patient placed on cardiac monitor, pulse oximetry, supplemental oxygen as necessary.  Sedation given: Propofol per anesthesiology Pacer pads placed anterior and posterior chest.  Cardioverted 1 time(s).  Cardioverted at 200J.  Evaluation Findings: Post procedure EKG shows: NSR Complications: None Patient did tolerate procedure well.  Continue amiodarone.    Julie Swanson 08/13/2015, 12:22 PM

## 2015-08-14 ENCOUNTER — Telehealth: Payer: Self-pay | Admitting: Physician Assistant

## 2015-08-14 NOTE — Telephone Encounter (Signed)
She needs to stop propranolol, stay on amiodarone.

## 2015-08-14 NOTE — Telephone Encounter (Signed)
Weekend answering service - Patient's daughter Darel HongJudy called with question about patient's status today. S/p cardioversion yesterday with successful DCCV to NSR. Since last night she's reported feeling "extremely weak" and appears "ashy." No CP or palpitations. She is now on amiodarone 200mg  daily and propranolol 20mg  BID. HR this AM is 52-58 after taking medicines (while having symptoms). BP not known - they haven't checked this yet. I told her I did not necessarily think that the HR in the mid 50's would be causing her to be so symptomatic. They still need to check her BP. Given that several other things could be going on and description of severity of symptoms, I advised they proceed to ER for evaluation - may need CBC to r/o bleeding since she's on a blood thinner, vitals check and EKG. Unfortunately Darel HongJudy does not think her mom will agree to going to the hospital but she will talk to her about this. I reiterated that I think it's the safest thing to do. I told her they should check her BP and if running low, they may hold further doses of propranolol (likewise with lower HRs) over the weekend. I told her if they decide not to go to hospital they need to call office on Monday with update for Dr. Shirlee LatchMcLean to determine if she needs to be worked in to clinic. Darel HongJudy verbalized understanding and gratitude.  Devontre Siedschlag PA-C

## 2015-08-15 NOTE — Telephone Encounter (Signed)
Called patient to check on her - she says she feels somewhat stronger today but still weak at times. BP yesterday was in the 120s/70s. Advised her to stop propranolol per Dr. Alford HighlandMcLean's recommendations. I asked her to please contact the office this upcoming week if she continues to have issues. She verbalized understanding and gratitude. Miata Culbreth PA-C

## 2015-08-16 ENCOUNTER — Encounter (HOSPITAL_COMMUNITY): Payer: Self-pay | Admitting: Cardiology

## 2015-08-16 NOTE — Anesthesia Postprocedure Evaluation (Signed)
Anesthesia Post Note  Patient: Julie Swanson  Procedure(s) Performed: Procedure(s) (LRB): CARDIOVERSION (N/A)  Patient location during evaluation: PACU Anesthesia Type: MAC Level of consciousness: awake and alert Pain management: pain level controlled Vital Signs Assessment: post-procedure vital signs reviewed and stable Respiratory status: spontaneous breathing, nonlabored ventilation, respiratory function stable and patient connected to nasal cannula oxygen Cardiovascular status: stable and blood pressure returned to baseline Anesthetic complications: no    Last Vitals:  Filed Vitals:   08/13/15 1240 08/13/15 1250  BP: 137/97 143/71  Pulse: 65 65  Temp:    Resp: 13 15    Last Pain: There were no vitals filed for this visit.               Damarius Karnes S

## 2015-08-19 ENCOUNTER — Ambulatory Visit (INDEPENDENT_AMBULATORY_CARE_PROVIDER_SITE_OTHER): Payer: Medicare Other | Admitting: Physician Assistant

## 2015-08-19 ENCOUNTER — Encounter: Payer: Self-pay | Admitting: Physician Assistant

## 2015-08-19 ENCOUNTER — Telehealth: Payer: Self-pay | Admitting: Cardiology

## 2015-08-19 VITALS — BP 132/74 | HR 99 | Ht 64.0 in | Wt 150.0 lb

## 2015-08-19 DIAGNOSIS — I4819 Other persistent atrial fibrillation: Secondary | ICD-10-CM

## 2015-08-19 DIAGNOSIS — I6523 Occlusion and stenosis of bilateral carotid arteries: Secondary | ICD-10-CM

## 2015-08-19 DIAGNOSIS — E785 Hyperlipidemia, unspecified: Secondary | ICD-10-CM | POA: Diagnosis not present

## 2015-08-19 DIAGNOSIS — I10 Essential (primary) hypertension: Secondary | ICD-10-CM

## 2015-08-19 DIAGNOSIS — I481 Persistent atrial fibrillation: Secondary | ICD-10-CM | POA: Diagnosis not present

## 2015-08-19 MED ORDER — AMIODARONE HCL 200 MG PO TABS: ORAL_TABLET | ORAL | Status: DC

## 2015-08-19 NOTE — Telephone Encounter (Signed)
Pt's daughter states pt's current heart rate is 127 all morning, 110/78. Pt's daughter states pt  was advised to stop Inderal over the weekend because of bradycardia, heart rates in the 50s. Pt's daughter states pt took last dose of Inderal Sun AM, heart rates last couple of days have been in the 70s and regular. Pt's usual dose of Inderal is 20mg  bid.  Pt's daughter states pt took amiodarone a few minutes ago, usually takes 200mg  around 12N or 1PM. Pt asymptomatic today, actually states she has more energy this morning.

## 2015-08-19 NOTE — Patient Instructions (Addendum)
Medication Instructions:  Your physician has recommended you make the following change in your medication:  1.  INCREASE the Amiodarone 400 mg in the a.m. And 200 mg in the evening.X 1 week then take 1 tablet twice a day 2.  START the Cardizem 30 mg tablet.. You can take it up to 4 times a day if your rate is over 100,   Labwork: None ordered  Testing/Procedures: None ordered  Follow-Up: Your physician recommends that you keep your scheduled follow-up appointment with Dr. Shirlee LatchMcLean 08/26/2015.    Any Other Special Instructions Will Be Listed Below (If Applicable).    If you need a refill on your cardiac medications before your next appointment, please call your pharmacy.

## 2015-08-19 NOTE — Progress Notes (Signed)
Cardiology Office Note   Date:  08/19/2015   ID:  Julie Swanson, DOB 1928/02/23, MRN [id number]   Patient Care Team: Babs Sciara, MD as PCP - General (Family Medicine) Laurey Morale, MD as Consulting Physician (Cardiology)    Chief Complaint  Patient presents with  . Tachycardia     History of Present Illness: Julie Swanson is a 81 y.o. female with a hx of PAF, MVP, HTN, HL, carotid stenosis, no significant CAD by cardiac catheterization 2010. She has been treated with Multaq for rhythm control in the past. She did not tolerate diltiazem secondary to ankle edema. She also did not tolerate carvedilol and was placed on propranolol. She had recurrent atrial fibrillation in 8/16 and was cardioverted in 9/16. She then had recurrent atrial fibrillation on Multaq. Given poor toleration of atrial fibrillation with symptoms of increased fatigue, amiodarone was started 07/20/15. She underwent DCCV with Dr. Shirlee Latch 08/13/15.  She then called in to the answering service with bradycardia and heart rates in the 40s and 50s. Propranolol was stopped. She then called in to the office today with complaints of heart rates in the 120s. She was asked to take another dose of amiodarone. She is brought in for further evaluation.  Her HR has been as high as 140 at home.  She has felt fatigued and has no energy like she does when she has AFib.  She has some atypical epigastric pain at times. This is not related to meals or exertion. She denies significant dyspnea, orthopnea, PND, edema. She has some positional back pain.  She denies any bleeding issues. She denies syncope.  Today, she is back in AFib with HR 99.  Note, she did not take the extra dose of Amiodarone as directed.  She wanted to come in to be seen first.     Studies/Reports Reviewed Today:  Holter 8/16 A. fib, HR 85  Carotid US 2/16 Bilateral ICA 1-39%  >> FU 2 years  Echo 08/06/13 Mild focal basal septal hypertrophy, EF 60-65%, normal  wall motion, grade 2 diastolic dysfunction, MAC, mild MR, mild BAE  ETT 10/13 Nonspecific ST-T wave changes  Myoview 4/12 No ischemia, EF 81%  LHC 04/2009 No significant CAD with irregularities in the LAD and RCA, EF 70%   Past Medical History  Diagnosis Date  . CAD (coronary artery disease)   . MVP (mitral valve prolapse)   . Palpitations   . HTN (hypertension)   . HLD (hyperlipidemia)   . Hyperkalemia   . Chest pain   . Osteoporosis   . Macular degeneration   . Arthritis   . Atrial fibrillation (HCC)   . Complication of anesthesia     pt states "hard to wake up"  1. LHC (8/10) with minimal nonobstructive disease. ETT (10/13) with 2'53" exercise, nonspecific ECG changes with no evidence for ischemia.  2. Mitral valve prolapse: Echo (11/14) with EF 60-65%, mild MR.  3. Atrial fibrillation: Event monitor (10/13) with PACs only. Event monitor (11/14) showed atrial fibrillation with RVR. She does not tolerate metoprolol (nightmares). She had ankle swelling with diltiazem. Possible fatigue/diarrhea with Coreg. Holter (8/16) with persistent atrial fibrillation. DCCV to NSR in 9/16. Back in atrial fibrillation by 10/16 despite Multaq.  4. HTN 5. Hyperlipidemia 6. Hysterectomy. 7. Carotid disease: Carotid dopplers (1/15) with prominent plaque, mild stenosis.  8. Internal hemorrhoids with episodic rectal bleeding.  9. Macular degeneration.    Past Surgical History  Procedure Laterality Date  .  Hysterectomy - unknown type    . Abdominal hysterectomy    . Cystectomy      BREAST  . Cataract extraction      BOTH EYES  . Colonoscopy    . Colonoscopy N/A 08/14/2014    Procedure: COLONOSCOPY;  Surgeon: Malissa Hippo, MD;  Location: [facility];  Service: Endoscopy;  Laterality: N/A;  225  . Cardioversion N/A 05/13/2015    Procedure: CARDIOVERSION;  Surgeon: Laurey Morale, MD;  Location: [facility] ENDOSCOPY;  Service: Cardiovascular;  Laterality: N/A;  . Cardioversion N/A  08/13/2015    Procedure: CARDIOVERSION;  Surgeon: Laurey Morale, MD;  Location: [facility] ENDOSCOPY;  Service: Cardiovascular;  Laterality: N/A;     Current Outpatient Prescriptions  Medication Sig Dispense Refill  . acetaminophen (TYLENOL) 500 MG tablet Take 500 mg by mouth 2 (two) times daily as needed for moderate pain.    Marland Kitchen alendronate (FOSAMAX) 70 MG tablet Take 1 tablet (70 mg total) by mouth every 7 (seven) days. Take with a full glass of water on an empty stomach. 4 tablet 11  . amiodarone (PACERONE) 200 MG tablet Take 2 tablets by mouth in the a.m and 1 tablet by mouth at bedtime for 1 week then take 1 tablet by mouth twice a day 180 tablet 3  . apixaban (ELIQUIS) 5 MG TABS tablet Take 1 tablet (5 mg total) by mouth 2 (two) times daily. 180 tablet 2  . atorvastatin (LIPITOR) 10 MG tablet Take 1 tablet (10 mg total) by mouth daily. 90 tablet 1  . cycloSPORINE (RESTASIS) 0.05 % ophthalmic emulsion Place 1 drop into both eyes daily.     Marland Kitchen diltiazem (CARDIZEM) 30 MG tablet Take 1 tablet every 4 hours AS NEEDED for afib HR>100 and BP>100 30 tablet 1  . hydrochlorothiazide (HYDRODIURIL) 25 MG tablet TAKE ONE TABLET BY MOUTH ONCE DAILY 30 tablet 3  . hydrocortisone-pramoxine (PROCTOFOAM HC) rectal foam Place 1 applicator rectally 2 (two) times daily. 10 g 0  . Lutein 20 MG CAPS Take 1 capsule by mouth daily.     . Multiple Vitamins-Minerals (ICAPS) CAPS Take 2 capsules by mouth daily.     No current facility-administered medications for this visit.    Allergies:   Valtrex; Antihistamines, chlorpheniramine-type; Evista; Feldene; Toprol xl; and Tropicamide    Social History:   Social History   Social History  . Marital Status: Widowed    Spouse Name: N/A  . Number of Children: N/A  . Years of Education: N/A   Social History Main Topics  . Smoking status: Never Smoker   . Smokeless tobacco: Never Used  . Alcohol Use: No  . Drug Use: No  . Sexual Activity: Not Asked   Other Topics  Concern  . None   Social History Narrative     Family History:   Family History  Problem Relation Age of Onset  . Stroke    . Heart attack    . Heart disease Mother   . Stroke Mother     ane melanoma  . Heart disease Father   . Bladder Cancer Father   . Cancer Brother   . CAD Brother     ALL  . CAD Sister     X2      ROS:   Please see the history of present illness.   Review of Systems  All other systems reviewed and are negative.     PHYSICAL EXAM: VS:  BP 132/74 mmHg  Pulse 99  Ht 5\' 4"  (1.626 m)  Wt 150 lb (68.04 kg)  BMI 25.73 kg/m2    Wt Readings from Last 3 Encounters:  08/19/15 150 lb (68.04 kg)  08/13/15 149 lb (67.586 kg)  08/09/15 149 lb (67.586 kg)     GEN: Well nourished, well developed, in no acute distress HEENT: normal Neck: no JVD,   no masses Cardiac:  Normal S1/S2, irregularly irregular rhythm; no murmur ,  no rubs or gallops, no edema   Respiratory:  clear to auscultation bilaterally, no wheezing, rhonchi or rales. GI: soft, nontender, nondistended, + BS MS: no deformity or atrophy Skin: warm and dry  Neuro:  CNs II-XII intact, Strength and sensation are intact Psych: Normal affect   EKG:  EKG is ordered today.  It demonstrates:   AFib, HR 99, normal axis, diffuse ST abnormality, no significant changes.   Recent Labs: 05/24/2015: BUN 13; Creatinine, Ser 1.06*; Potassium 4.6; Sodium 140 08/02/2015: ALT 23; Hemoglobin 11.5*; Platelets 223; TSH 4.232    Lipid Panel    Component Value Date/Time   CHOL 139 05/24/2015 0904   CHOL 145 06/11/2014 0816   TRIG 147 05/24/2015 0904   HDL 42 05/24/2015 0904   HDL 38* 06/11/2014 0816   CHOLHDL 3.3 05/24/2015 0904   CHOLHDL 3.8 06/11/2014 0816   VLDL 34 06/11/2014 0816   LDLCALC 68 05/24/2015 0904   LDLCALC 73 06/11/2014 0816      ASSESSMENT AND PLAN:  1. Persistent Atrial Fibrillation:  She has failed Multaq and was recently cardioverted on Amiodarone.  She is intol of Diltiazem  and all beta-blockers aside from Propranolol.  Propranolol was DC'd with HRs in the 40-50s.  She is now back in AFib.  HR controlled in the office.  She does not tolerate AFib well and has significant fatigue.   Options are somewhat limited.  QTc in NSR is too long for Tikosyn.  She failed Multaq.  I doubt she would tolerate Sotalol.  She did not have significant CAD at her cath in 2010.  Flecainide may be an alternate AAD option.  Will see if we can get her back in NSR with Amiodarone first.   -  Will try to re-load her with Amiodarone 400 mg A/ 200 mg P x 1 week, then decrease to 200 mg bid.    -  FU with Dr. Marca Ancona next week as planned.   -  If she does not convert on her own, consider repeat DCCV.    -  She can take Diltiazem 30 mg QID prn HR > 100.    -  CHADS2-VASc=4.  Continue Eliquis.     2. HTN:  Controlled.   3. Hyperlipidemia:  LDL in 9/16 was 68.  Continue statin.      Medication Changes: Current medicines are reviewed at length with the patient today.  Concerns regarding medicines are as outlined above.  The following changes have been made:   Discontinued Medications   CALCIUM-VITAMIN D (OSCAL WITH D) 500-200 MG-UNIT PER TABLET    Take 1 tablet by mouth daily with breakfast.    PROPRANOLOL (INDERAL) 20 MG TABLET    Take 20 mg by mouth 2 (two) times daily.    Modified Medications   Modified Medication Previous Medication   AMIODARONE (PACERONE) 200 MG TABLET amiodarone (PACERONE) 200 MG tablet      Take 2 tablets by mouth in the a.m and 1 tablet by mouth at bedtime for 1 week then take 1 tablet  by mouth twice a day    1 tablet by mouth two times a day for 2 weeks, then decrease to 1 tablet by mouth daily   New Prescriptions   No medications on file   Labs/ tests ordered today include:   Orders Placed This Encounter  Procedures  . EKG 12-Lead     Disposition:    FU with Dr. Marca Anconaalton McLean next week as planned.     Signed, Brynda RimScott Tyjah Hai, PA-C, MHS 08/19/2015  4:45 PM    [facility] [address], [place], [place]  [address] Phone: [phone]; Fax: [phone]

## 2015-08-19 NOTE — Telephone Encounter (Signed)
Reviewed with [name], PA,c--take another amiodarone 200mg  now, schedule appt for office visit in today or tomorrow.    Pt's daughter advised pt should take amiodarone 200mg  now (total of 400mg  today) appt scheduled today at 3:30PM with Scott. Pt's daughter will check pt's heart rate and BP prior to giving amiodarone.

## 2015-08-19 NOTE — Telephone Encounter (Signed)
New message   Patient c/o Palpitations:  High priority if patient c/o lightheadedness and shortness of breath.  1. How long have you been having palpitations? Last friday  2. Are you currently experiencing lightheadedness and shortness of breath? no  3. Have you checked your BP and heart rate? (document readings)  127  bp is 110/78  4. Are you experiencing any other symptoms? weak

## 2015-08-26 ENCOUNTER — Encounter: Payer: Self-pay | Admitting: *Deleted

## 2015-08-26 ENCOUNTER — Encounter: Payer: Self-pay | Admitting: Cardiology

## 2015-08-26 ENCOUNTER — Encounter: Payer: Self-pay | Admitting: Pharmacist

## 2015-08-26 ENCOUNTER — Ambulatory Visit (INDEPENDENT_AMBULATORY_CARE_PROVIDER_SITE_OTHER): Payer: Medicare Other | Admitting: Cardiology

## 2015-08-26 ENCOUNTER — Ambulatory Visit (INDEPENDENT_AMBULATORY_CARE_PROVIDER_SITE_OTHER): Payer: Medicare Other | Admitting: Pharmacist

## 2015-08-26 VITALS — BP 158/90 | HR 94 | Ht 64.0 in | Wt 151.0 lb

## 2015-08-26 VITALS — Wt 147.0 lb

## 2015-08-26 DIAGNOSIS — E785 Hyperlipidemia, unspecified: Secondary | ICD-10-CM

## 2015-08-26 DIAGNOSIS — I480 Paroxysmal atrial fibrillation: Secondary | ICD-10-CM

## 2015-08-26 DIAGNOSIS — I6523 Occlusion and stenosis of bilateral carotid arteries: Secondary | ICD-10-CM

## 2015-08-26 DIAGNOSIS — I4891 Unspecified atrial fibrillation: Secondary | ICD-10-CM

## 2015-08-26 LAB — CBC
HEMATOCRIT: 35.9 % — AB (ref 36.0–46.0)
HEMOGLOBIN: 12.5 g/dL (ref 12.0–15.0)
MCH: 30.3 pg (ref 26.0–34.0)
MCHC: 34.8 g/dL (ref 30.0–36.0)
MCV: 86.9 fL (ref 78.0–100.0)
MPV: 9 fL (ref 8.6–12.4)
Platelets: 280 10*3/uL (ref 150–400)
RBC: 4.13 MIL/uL (ref 3.87–5.11)
RDW: 14.4 % (ref 11.5–15.5)
WBC: 6.8 10*3/uL (ref 4.0–10.5)

## 2015-08-26 LAB — BASIC METABOLIC PANEL
BUN: 16 mg/dL (ref 7–25)
CALCIUM: 9 mg/dL (ref 8.6–10.4)
CO2: 27 mmol/L (ref 20–31)
Chloride: 96 mmol/L — ABNORMAL LOW (ref 98–110)
Creat: 0.96 mg/dL — ABNORMAL HIGH (ref 0.60–0.88)
GLUCOSE: 97 mg/dL (ref 65–99)
POTASSIUM: 3.9 mmol/L (ref 3.5–5.3)
Sodium: 134 mmol/L — ABNORMAL LOW (ref 135–146)

## 2015-08-26 NOTE — Progress Notes (Signed)
Patient ID: Julie Swanson, female   DOB: February 24, 1928, [age] y.o.   MRN: [id number] PCP: Dr. Lilyan PuntScott Luking  81 yo presents for followup of paroxysmal atrial fibrillation.  She had been having runs of rapid tachypalpitations when I saw her initially.  She was quite symptomatic with episodes.  Event monitor showed runs of atrial fibrillation with RVR.  I started her on Multaq for rhythm control given her significant symptoms and on apixaban.  She did not tolerate diltiazem due to ankle swelling and was switched over to Coreg for rate control.  She did not tolerate Coreg and is back on propranolol, which she does ok with.  Echo in 11/14 showed EF 60-65% with mild MR. No exertional chest pain or dyspnea. No lightheadedness.  She went into atrial fibrillation again in 8/16.  She had DCCV in 9/16 back to NSR and was continued on Multaq.  However, she went back into atrial fibrillation shortly after on despite Multaq use.  Multaq was stopped.  Given ongoing fatigue with atrial fibrillation, she was started on amiodarone.  DCCV was done in 12/16, she went back into NSR.  Propranolol was stopped due to bradycardia.  Unfortunately, atrial fibrillation has recurred.  She saw [name] and it was decided to try to reload her on amiodarone and attempt DCCV one more time.    Her HR is reasonably controlled today. She is feeling better overall though she remains in atrial fibrillation.  Still more fatigued than when she is in NSR.  She has been working around the house and cooking, no significant exertional dyspnea.  No chest pain or lightheadedness.  She has noted a tremor that has worsened since increasing amiodarone. She has not missed any Eliquis.   ECG: atrial fibrillation, iRBBB, QTc 477 msec.    Labs (10/14): LDL 90, HDL 41, K 4.1, creatinine 0.9 Labs (4/15): LDL 74, HDL 40, K 4.1, creatinine 4.090.87 Labs (10/15): LDL 73, HDL 38 Labs (12/15): K 4, creatinine 0.9, HCT 36 Labs (8/16): K 4, creatinine 0.97, HCT 37.8, TSH  normal Labs (9/16): K 4.6, creatinine 1.06, LDL 68, HDL 47 Labs (11/16): TSH normal, HCT 34.1, LFTs normal  PMH: 1. LHC (8/10) with minimal nonobstructive disease.  ETT (10/13) with 2'53" exercise, nonspecific ECG changes with no evidence for ischemia.  2. Mitral valve prolapse: Echo (11/14) with EF 60-65%, mild MR.  3. Atrial fibrillation:  Event monitor (10/13) with PACs only.  Event monitor (11/14) showed atrial fibrillation with RVR. She does not tolerate metoprolol (nightmares).  She had ankle swelling with diltiazem. Possible fatigue/diarrhea with Coreg. Holter (8/16) with persistent atrial fibrillation.  DCCV to NSR in 9/16.  Back in atrial fibrillation by 10/16 despite Multaq. DCCV to NSR in 12/16 on amiodarone, but atrial fibrillation recurred.  4. HTN 5. Hyperlipidemia 6. Hysterectomy. 7. Carotid disease: Carotid dopplers (1/15) with prominent plaque, mild stenosis.  8. Internal hemorrhoids with episodic rectal bleeding.  9. Macular degeneration.  10. PFTs: Mild obstructive lung disease.   SH: Widow, lives in [place], nonsmoker.  2 daughters.   FH: Mother with CVA, CAD.  Father with CAD.   ROS: All systems reviewed and negative except as per HPI.   Current Outpatient Prescriptions  Medication Sig Dispense Refill  . acetaminophen (TYLENOL) 500 MG tablet Take 500 mg by mouth 2 (two) times daily as needed for moderate pain.    Marland Kitchen. alendronate (FOSAMAX) 70 MG tablet Take 1 tablet (70 mg total) by mouth every 7 (seven) days. Take  with a full glass of water on an empty stomach. 4 tablet 11  . amiodarone (PACERONE) 200 MG tablet Take 2 tablets by mouth in the a.m and 1 tablet by mouth at bedtime for 1 week then take 1 tablet by mouth twice a day 180 tablet 3  . apixaban (ELIQUIS) 5 MG TABS tablet Take 1 tablet (5 mg total) by mouth 2 (two) times daily. 180 tablet 2  . atorvastatin (LIPITOR) 10 MG tablet Take 1 tablet (10 mg total) by mouth daily. 90 tablet 1  . cycloSPORINE  (RESTASIS) 0.05 % ophthalmic emulsion Place 1 drop into both eyes daily.     . diltiazem (CARDIZEM) 30 MG tablet Take 1 tablet every 4 hours AS NEEDED for afib HR>100 and BP>100 30 tablet 1  . hydrochlorothiazide (HYDRODIURIL) 25 MG tablet TAKE ONE TABLET BY MOUTH ONCE DAILY 30 tablet 3  . hydrocortisone-pramoxine (PROCTOFOAM HC) rectal foam Place 1 applicator rectally 2 (two) times daily. 10 g 0  . Lutein 20 MG CAPS Take 1 capsule by mouth daily.     . Multiple Vitamins-Minerals (ICAPS) CAPS Take 2 capsules by mouth daily.     No current facility-administered medications for this visit.    BP 158/90 mmHg  Pulse 94  Ht 5' 4" (1.626 m)  Wt 151 lb (68.493 kg)  BMI 25.91 kg/m2 General: NAD Neck: No JVD, no thyromegaly or thyroid nodule.  Lungs: Clear to auscultation bilaterally with normal respiratory effort. CV: Nondisplaced PMI.  Heart irregular S1/S2, no S3/S4, no murmur.  1+ ankle edema. Right carotid bruit. Normal pedal pulses.  Abdomen: Soft, nontender, no hepatosplenomegaly, no distention.  Skin: Intact without lesions or rashes.  Neurologic: Alert and oriented x 3.  Psych: Normal affect. Extremities: No clubbing or cyanosis.   Assessment/Plan: 1. Atrial fibrillation:  Persistent, has recurred after DCCV in 12/16 on amiodarone.  She tolerates atrial fibrillation poorly with increased fatigue. She has failed Multaq and has now broken through amiodarone.  She is not a good sotalol or Tikosyn candidate as QTc has been prolonged.   - We are reloading her on amiodarone and will re-attempt DCCV next week.  If this fails, we may have to manage her in atrial fibrillation.  We discussed the cardioversion today, she understands risks/benefits and is willing to have the procedure.  - She has macular degeneration so will need regular eye exams on amiodarone, she is aware of this.  She has had baseline PFTs,  LFTs and TSH normal recently.  She has a mild tremor that has worsened some with  reloading of amiodarone.  After cardioversion, will either decrease the amiodarone dose if it succeeds or stop it altogether if it fails.  - Continue Eliquis, she has not missed any doses.  - She is off propranolol for now due to bradycardia when in NSR.  She has not tolerated diltiazem CD or other beta blockers.   - Rectal bleeding resolved with injection of hemorrhoids. 2. HTN:  BP elevated today, will follow.    3. Hyperlipidemia: Good lipids in 9/16.  4. Carotid bruit: Mild stenosis only.    Gates Jividen 08/26/2015   

## 2015-08-26 NOTE — Progress Notes (Signed)
Pt was started on Eliquis for atrial fibrillation on 08/11/13 by Dr. Shirlee LatchMcLean.   Reviewed patients medication list.  Pt is not currently on any combined P-gp and strong CYP3A4 inhibitors/inducers (ketoconazole, itraconazole, ritonavir, carbamazepine, phenytoin, rifampin, St. John's wort).  Reviewed labs.  SCr 0.96, Weight 66.8kg, CrCl 6445mL/min.  Dose appropriate based on SCr, age, and weight.  Hgb and HCT wnl and stable at 12.5 and 35.9.  A full discussion of the nature of anticoagulants has been carried out.  A benefit/risk analysis has been presented to the patient, so that they understand the justification for choosing anticoagulation with Eliquis at this time.  The need for compliance is stressed.  Pt is aware to take the medication twice daily.  Side effects of potential bleeding are discussed, including unusual colored urine or stools, coughing up blood or coffee ground emesis, nose bleeds or serious fall or head trauma.  Discussed signs and symptoms of stroke. The patient should avoid any OTC items containing aspirin or ibuprofen.  Avoid alcohol consumption. Call if any signs of abnormal bleeding. Discussed financial obligations and resolved any difficulty in obtaining medication.

## 2015-08-26 NOTE — Patient Instructions (Signed)
Medication Instructions:  No changes today  Labwork: None today  Testing/Procedures: Your physician has recommended that you have a Cardioversion (DCCV). Electrical Cardioversion uses a jolt of electricity to your heart either through paddles or wired patches attached to your chest. This is a controlled, usually prescheduled, procedure. Defibrillation is done under light anesthesia in the hospital, and you usually go home the day of the procedure. This is done to get your heart back into a normal rhythm. You are not awake for the procedure. Please see the instruction sheet given to you today. Tuesday December 20,2016     Follow-Up: Your physician recommends that you schedule a follow-up appointment in: 2 weeks with Rudi Cocoonna Carroll, NP in the Atrial Fibrillation [facility].  Your physician recommends that you schedule a follow-up appointment in: 2 months with Dr Shirlee LatchMcLean.     Any Other Special Instructions Will Be Listed Below (If Applicable).     If you need a refill on your cardiac medications before your next appointment, please call your pharmacy.

## 2015-08-31 ENCOUNTER — Ambulatory Visit (HOSPITAL_COMMUNITY): Payer: Medicare Other | Admitting: Anesthesiology

## 2015-08-31 ENCOUNTER — Encounter (HOSPITAL_COMMUNITY)
Admission: RE | Disposition: A | Discharge status: Home / Self Care | Payer: Self-pay | Source: Ambulatory Visit | Attending: Cardiology

## 2015-08-31 ENCOUNTER — Ambulatory Visit (HOSPITAL_COMMUNITY)
Admission: RE | Admit: 2015-08-31 | Discharge: 2015-08-31 | Disposition: A | Discharge status: Home / Self Care | Payer: Medicare Other | Source: Ambulatory Visit | Attending: Cardiology | Admitting: Cardiology

## 2015-08-31 ENCOUNTER — Encounter (HOSPITAL_COMMUNITY): Payer: Self-pay | Admitting: *Deleted

## 2015-08-31 DIAGNOSIS — Z79899 Other long term (current) drug therapy: Secondary | ICD-10-CM | POA: Insufficient documentation

## 2015-08-31 DIAGNOSIS — I10 Essential (primary) hypertension: Secondary | ICD-10-CM | POA: Insufficient documentation

## 2015-08-31 DIAGNOSIS — I4891 Unspecified atrial fibrillation: Secondary | ICD-10-CM | POA: Diagnosis present

## 2015-08-31 DIAGNOSIS — I341 Nonrheumatic mitral (valve) prolapse: Secondary | ICD-10-CM | POA: Insufficient documentation

## 2015-08-31 DIAGNOSIS — E785 Hyperlipidemia, unspecified: Secondary | ICD-10-CM | POA: Insufficient documentation

## 2015-08-31 DIAGNOSIS — H3530 Unspecified macular degeneration: Secondary | ICD-10-CM | POA: Insufficient documentation

## 2015-08-31 DIAGNOSIS — Z8249 Family history of ischemic heart disease and other diseases of the circulatory system: Secondary | ICD-10-CM | POA: Diagnosis not present

## 2015-08-31 DIAGNOSIS — I481 Persistent atrial fibrillation: Secondary | ICD-10-CM | POA: Diagnosis not present

## 2015-08-31 DIAGNOSIS — Z7901 Long term (current) use of anticoagulants: Secondary | ICD-10-CM | POA: Diagnosis not present

## 2015-08-31 HISTORY — PX: CARDIOVERSION: SHX1299

## 2015-08-31 SURGERY — CARDIOVERSION
Anesthesia: General

## 2015-08-31 MED ORDER — PROPOFOL 10 MG/ML IV BOLUS
INTRAVENOUS | Status: DC | PRN
  Administered 2015-08-31: 40 mg via INTRAVENOUS

## 2015-08-31 MED ORDER — LIDOCAINE HCL (CARDIAC) 20 MG/ML IV SOLN
INTRAVENOUS | Status: DC | PRN
  Administered 2015-08-31: 60 mg via INTRAVENOUS

## 2015-08-31 MED ORDER — SODIUM CHLORIDE 0.9 % IV SOLN
INTRAVENOUS | Status: DC
Start: 2015-08-31 — End: 2015-08-31
  Administered 2015-08-31: 500 mL via INTRAVENOUS

## 2015-08-31 MED ORDER — AMIODARONE HCL 200 MG PO TABS: ORAL_TABLET | ORAL | Status: DC

## 2015-08-31 NOTE — Discharge Instructions (Signed)
Electrical Cardioversion, Care After °Refer to this sheet in the next few weeks. These instructions provide you with information on caring for yourself after your procedure. Your health care provider may also give you more specific instructions. Your treatment has been planned according to current medical practices, but problems sometimes occur. Call your health care provider if you have any problems or questions after your procedure. °WHAT TO EXPECT AFTER THE PROCEDURE °After your procedure, it is typical to have the following sensations: °· Some redness on the skin where the shocks were delivered. If this is tender, a sunburn lotion or hydrocortisone cream may help. °· Possible return of an abnormal heart rhythm within hours or days after the procedure. °HOME CARE INSTRUCTIONS °· Take medicines only as directed by your health care provider. Be sure you understand how and when to take your medicine. °· Learn how to feel your pulse and check it often. °· Limit your activity for 48 hours after the procedure or as directed by your health care provider. °· Avoid or minimize caffeine and other stimulants as directed by your health care provider. °SEEK MEDICAL CARE IF: °· You feel like your heart is beating too fast or your pulse is not regular. °· You have any questions about your medicines. °· You have bleeding that will not stop. °SEEK IMMEDIATE MEDICAL CARE IF: °· You are dizzy or feel faint. °· It is hard to breathe or you feel short of breath. °· There is a change in discomfort in your chest. °· Your speech is slurred or you have trouble moving an arm or leg on one side of your body. °· You get a serious muscle cramp that does not go away. °· Your fingers or toes turn cold or blue. °  °This information is not intended to replace advice given to you by your health care provider. Make sure you discuss any questions you have with your health care provider. °  °Document Released: 06/18/2013 Document Revised: 09/18/2014  Document Reviewed: 06/18/2013 °Elsevier Interactive Patient Education ©2016 [employer] ° °

## 2015-08-31 NOTE — Anesthesia Preprocedure Evaluation (Addendum)
Anesthesia Evaluation  Patient identified by MRN, date of birth, ID band Patient awake    Reviewed: Allergy & Precautions, H&P , NPO status , Patient's Chart, lab work & pertinent test results  Airway Mallampati: II  TM Distance: >3 FB Neck ROM: Full    Dental no notable dental hx. (+) Teeth Intact, Dental Advisory Given   Pulmonary neg pulmonary ROS,    Pulmonary exam normal breath sounds clear to auscultation       Cardiovascular hypertension, Pt. on medications + CAD  negative cardio ROS  + dysrhythmias Atrial Fibrillation  Rhythm:Irregular Rate:Normal     Neuro/Psych negative neurological ROS  negative psych ROS   GI/Hepatic negative GI ROS, Neg liver ROS,   Endo/Other  negative endocrine ROS  Renal/GU negative Renal ROS  negative genitourinary   Musculoskeletal  (+) Arthritis , Osteoarthritis,    Abdominal   Peds  Hematology negative hematology ROS (+)   Anesthesia Other Findings   Reproductive/Obstetrics negative OB ROS                            Anesthesia Physical Anesthesia Plan  ASA: III  Anesthesia Plan: General   Post-op Pain Management:    Induction: Intravenous  Airway Management Planned: Mask  Additional Equipment:   Intra-op Plan:   Post-operative Plan: Extubation in OR  Informed Consent: I have reviewed the patients History and Physical, chart, labs and discussed the procedure including the risks, benefits and alternatives for the proposed anesthesia with the patient or authorized representative who has indicated his/her understanding and acceptance.   Dental advisory given  Plan Discussed with: CRNA  Anesthesia Plan Comments:         Anesthesia Quick Evaluation

## 2015-08-31 NOTE — Anesthesia Postprocedure Evaluation (Signed)
Anesthesia Post Note  Patient: Julie Swanson  Procedure(s) Performed: Procedure(s) (LRB): CARDIOVERSION (N/A)  Patient location during evaluation: PACU Anesthesia Type: General Level of consciousness: awake and alert Pain management: pain level controlled Vital Signs Assessment: post-procedure vital signs reviewed and stable Respiratory status: spontaneous breathing, nonlabored ventilation and respiratory function stable Cardiovascular status: blood pressure returned to baseline and stable Postop Assessment: no signs of nausea or vomiting Anesthetic complications: no    Last Vitals:  Filed Vitals:   08/31/15 1330 08/31/15 1340  BP: 158/68 166/85  Pulse:    Resp:      Last Pain:  Filed Vitals:   08/31/15 1350  PainSc: 0-No pain                 Julie Swanson,Julie Swanson

## 2015-08-31 NOTE — H&P (View-Only) (Signed)
Patient ID: Julie SageNancy S Swanson, female   DOB: February 24, 1928, [age] y.o.   MRN: [id number] PCP: Dr. Lilyan PuntScott Luking  81 yo presents for followup of paroxysmal atrial fibrillation.  She had been having runs of rapid tachypalpitations when I saw her initially.  She was quite symptomatic with episodes.  Event monitor showed runs of atrial fibrillation with RVR.  I started her on Multaq for rhythm control given her significant symptoms and on apixaban.  She did not tolerate diltiazem due to ankle swelling and was switched over to Coreg for rate control.  She did not tolerate Coreg and is back on propranolol, which she does ok with.  Echo in 11/14 showed EF 60-65% with mild MR. No exertional chest pain or dyspnea. No lightheadedness.  She went into atrial fibrillation again in 8/16.  She had DCCV in 9/16 back to NSR and was continued on Multaq.  However, she went back into atrial fibrillation shortly after on despite Multaq use.  Multaq was stopped.  Given ongoing fatigue with atrial fibrillation, she was started on amiodarone.  DCCV was done in 12/16, she went back into NSR.  Propranolol was stopped due to bradycardia.  Unfortunately, atrial fibrillation has recurred.  She saw [name] and it was decided to try to reload her on amiodarone and attempt DCCV one more time.    Her HR is reasonably controlled today. She is feeling better overall though she remains in atrial fibrillation.  Still more fatigued than when she is in NSR.  She has been working around the house and cooking, no significant exertional dyspnea.  No chest pain or lightheadedness.  She has noted a tremor that has worsened since increasing amiodarone. She has not missed any Eliquis.   ECG: atrial fibrillation, iRBBB, QTc 477 msec.    Labs (10/14): LDL 90, HDL 41, K 4.1, creatinine 0.9 Labs (4/15): LDL 74, HDL 40, K 4.1, creatinine 4.090.87 Labs (10/15): LDL 73, HDL 38 Labs (12/15): K 4, creatinine 0.9, HCT 36 Labs (8/16): K 4, creatinine 0.97, HCT 37.8, TSH  normal Labs (9/16): K 4.6, creatinine 1.06, LDL 68, HDL 47 Labs (11/16): TSH normal, HCT 34.1, LFTs normal  PMH: 1. LHC (8/10) with minimal nonobstructive disease.  ETT (10/13) with 2'53" exercise, nonspecific ECG changes with no evidence for ischemia.  2. Mitral valve prolapse: Echo (11/14) with EF 60-65%, mild MR.  3. Atrial fibrillation:  Event monitor (10/13) with PACs only.  Event monitor (11/14) showed atrial fibrillation with RVR. She does not tolerate metoprolol (nightmares).  She had ankle swelling with diltiazem. Possible fatigue/diarrhea with Coreg. Holter (8/16) with persistent atrial fibrillation.  DCCV to NSR in 9/16.  Back in atrial fibrillation by 10/16 despite Multaq. DCCV to NSR in 12/16 on amiodarone, but atrial fibrillation recurred.  4. HTN 5. Hyperlipidemia 6. Hysterectomy. 7. Carotid disease: Carotid dopplers (1/15) with prominent plaque, mild stenosis.  8. Internal hemorrhoids with episodic rectal bleeding.  9. Macular degeneration.  10. PFTs: Mild obstructive lung disease.   SH: Widow, [place] in [place], nonsmoker.  2 daughters.   FH: Mother with CVA, CAD.  Father with CAD.   ROS: All systems reviewed and negative except as per HPI.   Current Outpatient Prescriptions  Medication Sig Dispense Refill  . acetaminophen (TYLENOL) 500 MG tablet Take 500 mg by mouth 2 (two) times daily as needed for moderate pain.    Marland Kitchen. alendronate (FOSAMAX) 70 MG tablet Take 1 tablet (70 mg total) by mouth every 7 (seven) days. Take  with a full glass of water on an empty stomach. 4 tablet 11  . amiodarone (PACERONE) 200 MG tablet Take 2 tablets by mouth in the a.m and 1 tablet by mouth at bedtime for 1 week then take 1 tablet by mouth twice a day 180 tablet 3  . apixaban (ELIQUIS) 5 MG TABS tablet Take 1 tablet (5 mg total) by mouth 2 (two) times daily. 180 tablet 2  . atorvastatin (LIPITOR) 10 MG tablet Take 1 tablet (10 mg total) by mouth daily. 90 tablet 1  . cycloSPORINE  (RESTASIS) 0.05 % ophthalmic emulsion Place 1 drop into both eyes daily.     Marland Kitchen diltiazem (CARDIZEM) 30 MG tablet Take 1 tablet every 4 hours AS NEEDED for afib HR>100 and BP>100 30 tablet 1  . hydrochlorothiazide (HYDRODIURIL) 25 MG tablet TAKE ONE TABLET BY MOUTH ONCE DAILY 30 tablet 3  . hydrocortisone-pramoxine (PROCTOFOAM HC) rectal foam Place 1 applicator rectally 2 (two) times daily. 10 g 0  . Lutein 20 MG CAPS Take 1 capsule by mouth daily.     . Multiple Vitamins-Minerals (ICAPS) CAPS Take 2 capsules by mouth daily.     No current facility-administered medications for this visit.    BP 158/90 mmHg  Pulse 94  Ht  (1.626 m)  Wt 151 lb (68.493 kg)  BMI 25.91 kg/m2 General: NAD Neck: No JVD, no thyromegaly or thyroid nodule.  Lungs: Clear to auscultation bilaterally with normal respiratory effort. CV: Nondisplaced PMI.  Heart irregular S1/S2, no S3/S4, no murmur.  1+ ankle edema. Right carotid bruit. Normal pedal pulses.  Abdomen: Soft, nontender, no hepatosplenomegaly, no distention.  Skin: Intact without lesions or rashes.  Neurologic: Alert and oriented x 3.  Psych: Normal affect. Extremities: No clubbing or cyanosis.   Assessment/Plan: 1. Atrial fibrillation:  Persistent, has recurred after DCCV in 12/16 on amiodarone.  She tolerates atrial fibrillation poorly with increased fatigue. She has failed Multaq and has now broken through amiodarone.  She is not a good sotalol or Tikosyn candidate as QTc has been prolonged.   - We are reloading her on amiodarone and will re-attempt DCCV next week.  If this fails, we may have to manage her in atrial fibrillation.  We discussed the cardioversion today, she understands risks/benefits and is willing to have the procedure.  - She has macular degeneration so will need regular eye exams on amiodarone, she is aware of this.  She has had baseline PFTs,  LFTs and TSH normal recently.  She has a mild tremor that has worsened some with  reloading of amiodarone.  After cardioversion, will either decrease the amiodarone dose if it succeeds or stop it altogether if it fails.  - Continue Eliquis, she has not missed any doses.  - She is off propranolol for now due to bradycardia when in NSR.  She has not tolerated diltiazem CD or other beta blockers.   - Rectal bleeding resolved with injection of hemorrhoids. 2. HTN:  BP elevated today, will follow.    3. Hyperlipidemia: Good lipids in 9/16.  4. Carotid bruit: Mild stenosis only.    Marca Ancona 08/26/2015

## 2015-08-31 NOTE — Procedures (Signed)
Electrical Cardioversion Procedure Note Julie Swanson [id number] July 12, 1928  Procedure: Electrical Cardioversion Indications:  Atrial Fibrillation  Procedure Details Consent: Risks of procedure as well as the alternatives and risks of each were explained to the (patient/caregiver).  Consent for procedure obtained. Time Out: Verified patient identification, verified procedure, site/side was marked, verified correct patient position, special equipment/implants available, medications/allergies/relevent history reviewed, required imaging and test results available.  Performed  Patient placed on cardiac monitor, pulse oximetry, supplemental oxygen as necessary.  Sedation given: Propofol per anesthesiology Pacer pads placed anterior and posterior chest.  Cardioverted 1 time(s).  Cardioverted at 200J.  Evaluation Findings: Post procedure EKG shows: NSR Complications: None Patient did tolerate procedure well.  Decrease amiodarone to 200 mg bid x 7 days then 200 mg daily after that.  If she hold NSR for a while and then goes back to atrial fibrillation, could consider add-on ranolazine.    Julie AnconaDalton Swanson 08/31/2015, 1:18 PM

## 2015-08-31 NOTE — Transfer of Care (Signed)
Immediate Anesthesia Transfer of Care Note  Patient: Carroll SageNancy S Feinberg  Procedure(s) Performed: Procedure(s): CARDIOVERSION (N/A)  Patient Location: PACU  Anesthesia Type:MAC  Level of Consciousness: awake, alert , oriented and patient cooperative  Airway & Oxygen Therapy: Patient Spontanous Breathing and Patient connected to nasal cannula oxygen  Post-op Assessment: Report given to RN, Post -op Vital signs reviewed and stable and Patient moving all extremities  Post vital signs: Reviewed and stable  Last Vitals:  Filed Vitals:   08/31/15 1313 08/31/15 1315  BP: 123/62 151/69  Pulse:    Resp:      Complications: No apparent anesthesia complications

## 2015-08-31 NOTE — Interval H&P Note (Signed)
History and Physical Interval Note:  08/31/2015 1:09 PM  [name]  has presented today for surgery, with the diagnosis of AFIB  The various methods of treatment have been discussed with the patient and family. After consideration of risks, benefits and other options for treatment, the patient has consented to  Procedure(s): CARDIOVERSION (N/A) as a surgical intervention .  The patient's history has been reviewed, patient examined, no change in status, stable for surgery.  I have reviewed the patient's chart and labs.  Questions were answered to the patient's satisfaction.     Cecilie Heidel [employer]

## 2015-09-01 ENCOUNTER — Encounter (HOSPITAL_COMMUNITY): Payer: Self-pay | Admitting: Cardiology

## 2015-09-08 ENCOUNTER — Other Ambulatory Visit: Payer: Self-pay | Admitting: Family Medicine

## 2015-09-10 ENCOUNTER — Encounter (HOSPITAL_COMMUNITY): Payer: Self-pay | Admitting: Nurse Practitioner

## 2015-09-10 ENCOUNTER — Ambulatory Visit (HOSPITAL_COMMUNITY)
Admission: RE | Admit: 2015-09-10 | Discharge: 2015-09-10 | Disposition: A | Discharge status: Home / Self Care | Payer: Medicare Other | Source: Ambulatory Visit | Attending: Nurse Practitioner | Admitting: Nurse Practitioner

## 2015-09-10 VITALS — BP 138/80 | HR 87 | Ht 64.0 in | Wt 150.4 lb

## 2015-09-10 DIAGNOSIS — I481 Persistent atrial fibrillation: Secondary | ICD-10-CM | POA: Diagnosis present

## 2015-09-10 DIAGNOSIS — I4819 Other persistent atrial fibrillation: Secondary | ICD-10-CM

## 2015-09-10 NOTE — Progress Notes (Signed)
Patient ID: Julie Swanson, female   DOB: 15-Mar-1928, 81 y.o.   MRN: [id number]     Primary Care Physician: Lilyan Punt, MD Referring Physician: Dr. Rockey Situ Julie Swanson is a 81 y.o. female with a h/o persistent afib  tahat had in the recent past failed multaq and was loaded on amiodarone with unsuccessful cardioversion 12/2, however with ERAF. She was then reloaded on amiodarone and again cardioversion was attempted and stayed in SR x one week. She called mid week to say she was back in afib. She doubled her amiodarone to 400 mg bid until seen today. Unfortunately, she remains in afib with cvr. She reports that she really did not feel that different in SR for the last week . She is however noticing a mild tremor and some memory issues and thinks it is from amiodarone. Per Dr. Kathlyn Sacramento note, if the last cardioversion did not hold pt in rhythm, then she is to be rate controlled, due to h/o prolonged qt and sotalol and tikosyn would be an issue. The pt and family are in agreement with this plan.  Today, she denies symptoms of palpitations, chest pain, shortness of breath, orthopnea, PND, lower extremity edema, dizziness, presyncope, syncope, or neurologic sequela. Mild tremor. The patient is tolerating medications without difficulties and is otherwise without complaint today.   Past Medical History  Diagnosis Date  . CAD (coronary artery disease)   . MVP (mitral valve prolapse)   . Palpitations   . HTN (hypertension)   . HLD (hyperlipidemia)   . Hyperkalemia   . Chest pain   . Osteoporosis   . Macular degeneration   . Arthritis   . Atrial fibrillation (HCC)   . Complication of anesthesia     pt states "hard to wake up"   Past Surgical History  Procedure Laterality Date  . Hysterectomy - unknown type    . Abdominal hysterectomy    . Cystectomy      BREAST  . Cataract extraction      BOTH EYES  . Colonoscopy    . Colonoscopy N/A 08/14/2014    Procedure: COLONOSCOPY;  Surgeon:  Malissa Hippo, MD;  Location[facility];  Service: Endoscopy;  Laterality: N/A;  225  . Cardioversion N/A 05/13/2015    Procedure: CARDIOVERSION;  Surgeon: Laurey Morale, MD;  Location: [facility] ENDOSCOPY;  Service: Cardiovascular;  Laterality: N/A;  . Cardioversion N/A 08/13/2015    Procedure: CARDIOVERSION;  Surgeon: Laurey Morale, MD;  Location: [facility] ENDOSCOPY;  Service: Cardiovascular;  Laterality: N/A;  . Cardioversion N/A 08/31/2015    Procedure: CARDIOVERSION;  Surgeon: Laurey Morale, MD;  Location: [facility] ENDOSCOPY;  Service: Cardiovascular;  Laterality: N/A;    Current Outpatient Prescriptions  Medication Sig Dispense Refill  . acetaminophen (TYLENOL) 500 MG tablet Take 500 mg by mouth 2 (two) times daily as needed for moderate pain.    Marland Kitchen alendronate (FOSAMAX) 70 MG tablet Take 1 tablet (70 mg total) by mouth every 7 (seven) days. Take with a full glass of water on an empty stomach. 4 tablet 11  . amiodarone (PACERONE) 200 MG tablet Take 1 tablet twice a day for 1 week, then take 1 tablet once a day. 180 tablet 3  . apixaban (ELIQUIS) 5 MG TABS tablet Take 1 tablet (5 mg total) by mouth 2 (two) times daily. 180 tablet 2  . atorvastatin (LIPITOR) 10 MG tablet TAKE 1 TABLET EVERY DAY 90 tablet 1  . cycloSPORINE (RESTASIS) 0.05 %  ophthalmic emulsion Place 1 drop into both eyes daily.     Marland Kitchen. diltiazem (CARDIZEM) 30 MG tablet Take 1 tablet every 4 hours AS NEEDED for afib HR>100 and BP>100 30 tablet 1  . hydrochlorothiazide (HYDRODIURIL) 25 MG tablet TAKE ONE TABLET BY MOUTH ONCE DAILY 30 tablet 3  . hydrocortisone-pramoxine (PROCTOFOAM HC) rectal foam Place 1 applicator rectally 2 (two) times daily. 10 g 0  . Lutein 20 MG CAPS Take 1 capsule by mouth daily.     . Multiple Vitamins-Minerals (ICAPS) CAPS Take 2 capsules by mouth daily.     No current facility-administered medications for this encounter.    Allergies  Allergen Reactions  . Valtrex [Valacyclovir Hcl] Palpitations    Chest  pain.  Marland Kitchen. Antihistamines, Chlorpheniramine-Type Other (See Comments)    Patient states it makes her feel like she's in "la la land"  . Evista [Raloxifene] Other (See Comments)    Causes knots  . Feldene [Piroxicam] Other (See Comments)    unknown  . Toprol Xl [Metoprolol Tartrate]     Bad dreams  . Tropicamide     Dizziness, lethargic, non-responsive    Social History   Social History  . Marital Status: Widowed    Spouse Name: N/A  . Number of Children: N/A  . Years of Education: N/A   Occupational History  . Not on file.   Social History Main Topics  . Smoking status: Never Smoker   . Smokeless tobacco: Never Used  . Alcohol Use: No  . Drug Use: No  . Sexual Activity: Not on file   Other Topics Concern  . Not on file   Social History Narrative    Family History  Problem Relation Age of Onset  . Stroke    . Heart attack    . Heart disease Mother   . Stroke Mother     ane melanoma  . Heart disease Father   . Bladder Cancer Father   . Cancer Brother   . CAD Brother     ALL  . CAD Sister     X2    ROS- All systems are reviewed and negative except as per the HPI above  Physical Exam: Filed Vitals:   09/10/15 0850  BP: 138/80  Pulse: 87  Height: 5\' 4"  (1.626 m)  Weight: 150 lb 6.4 oz (68.221 kg)    GEN- The patient is well appearing, alert and oriented x 3 today.   Head- normocephalic, atraumatic Eyes-  Sclera clear, conjunctiva pink Ears- hearing intact Oropharynx- clear Neck- supple, no JVP Lymph- no cervical lymphadenopathy Lungs- Clear to ausculation bilaterally, normal work of breathing Heart- Irregular rate and rhythm, no murmurs, rubs or gallops, PMI not laterally displaced GI- soft, NT, ND, + BS Extremities- no clubbing, cyanosis, or edema MS- no significant deformity or atrophy Skin- no rash or lesion Psych- euthymic mood, full affect Neuro- strength and sensation are intact  EKG- EKG today shows afib at 87 bpm, QRS int 96 ms, QTc 457  ms IRBBB Epic records reviewed  Assessment and Plan:  1.Persistent afib ERAF s/p cardioversion 9/1, 12/2 and past amiodarone loading x 2 and last dccv, 12/20 Pt did not see improvement when she was in ASR compared to rate controlled afib Per Dr. Alford HighlandMclean's recommendation, will stop amiodarone and rate control. Continue apixaban. Will bring back next week after some washout of amiodarone and restart propanolol  20 mg bid for rate control She has not tolerated carvedilol or metoprolol in the past  due to nightmares on both. She has not tolerated Cardizem in the past due to mild LLE.     Elvina Sidle [facility] [address] [place], [place] [address] [phone]

## 2015-09-14 ENCOUNTER — Ambulatory Visit (HOSPITAL_COMMUNITY): Payer: Medicare Other | Admitting: Nurse Practitioner

## 2015-09-17 ENCOUNTER — Ambulatory Visit (HOSPITAL_COMMUNITY)
Admission: RE | Admit: 2015-09-17 | Discharge: 2015-09-17 | Disposition: A | Discharge status: Home / Self Care | Payer: Medicare Other | Source: Ambulatory Visit | Attending: Nurse Practitioner | Admitting: Nurse Practitioner

## 2015-09-17 ENCOUNTER — Encounter (HOSPITAL_COMMUNITY): Payer: Self-pay | Admitting: Nurse Practitioner

## 2015-09-17 VITALS — BP 140/64 | HR 99 | Ht 64.0 in | Wt 150.2 lb

## 2015-09-17 DIAGNOSIS — M810 Age-related osteoporosis without current pathological fracture: Secondary | ICD-10-CM | POA: Diagnosis not present

## 2015-09-17 DIAGNOSIS — I482 Chronic atrial fibrillation, unspecified: Secondary | ICD-10-CM

## 2015-09-17 DIAGNOSIS — Z823 Family history of stroke: Secondary | ICD-10-CM | POA: Diagnosis not present

## 2015-09-17 DIAGNOSIS — E785 Hyperlipidemia, unspecified: Secondary | ICD-10-CM | POA: Insufficient documentation

## 2015-09-17 DIAGNOSIS — I2510 Atherosclerotic heart disease of native coronary artery without angina pectoris: Secondary | ICD-10-CM | POA: Diagnosis not present

## 2015-09-17 DIAGNOSIS — Z7902 Long term (current) use of antithrombotics/antiplatelets: Secondary | ICD-10-CM | POA: Insufficient documentation

## 2015-09-17 DIAGNOSIS — I10 Essential (primary) hypertension: Secondary | ICD-10-CM | POA: Insufficient documentation

## 2015-09-17 DIAGNOSIS — Z7983 Long term (current) use of bisphosphonates: Secondary | ICD-10-CM | POA: Diagnosis not present

## 2015-09-17 DIAGNOSIS — Z8249 Family history of ischemic heart disease and other diseases of the circulatory system: Secondary | ICD-10-CM | POA: Insufficient documentation

## 2015-09-17 DIAGNOSIS — Z79899 Other long term (current) drug therapy: Secondary | ICD-10-CM | POA: Insufficient documentation

## 2015-09-17 DIAGNOSIS — H3530 Unspecified macular degeneration: Secondary | ICD-10-CM | POA: Insufficient documentation

## 2015-09-17 MED ORDER — PROPRANOLOL HCL 20 MG PO TABS: 10.0000 mg | ORAL_TABLET | Freq: Two times a day (BID) | ORAL | Status: DC

## 2015-09-17 NOTE — Progress Notes (Signed)
Patient ID: Klaira Pesci Sage, female   DOB: 15-May-1928, 82 y.o.   MRN: [id number]     Primary Care Physician: Lilyan Punt, MD Referring Physician:   MELISSSA DONNER is a 82 y.o. female with a h/o     Today, she denies symptoms of palpitations, chest pain, shortness of breath, orthopnea, PND, lower extremity edema, dizziness, presyncope, syncope, or neurologic sequela. The patient is tolerating medications without difficulties and is otherwise without complaint today.   Past Medical History  Diagnosis Date  . CAD (coronary artery disease)   . MVP (mitral valve prolapse)   . Palpitations   . HTN (hypertension)   . HLD (hyperlipidemia)   . Hyperkalemia   . Chest pain   . Osteoporosis   . Macular degeneration   . Arthritis   . Atrial fibrillation (HCC)   . Complication of anesthesia     pt states "hard to wake up"   Past Surgical History  Procedure Laterality Date  . Hysterectomy - unknown type    . Abdominal hysterectomy    . Cystectomy      BREAST  . Cataract extraction      BOTH EYES  . Colonoscopy    . Colonoscopy N/A 08/14/2014    Procedure: COLONOSCOPY;  Surgeon: Malissa Hippo, MD;  Location: [facility];  Service: Endoscopy;  Laterality: N/A;  225  . Cardioversion N/A 05/13/2015    Procedure: CARDIOVERSION;  Surgeon: Laurey Morale, MD;  Location: [facility] ENDOSCOPY;  Service: Cardiovascular;  Laterality: N/A;  . Cardioversion N/A 08/13/2015    Procedure: CARDIOVERSION;  Surgeon: Laurey Morale, MD;  Location: [facility] ENDOSCOPY;  Service: Cardiovascular;  Laterality: N/A;  . Cardioversion N/A 08/31/2015    Procedure: CARDIOVERSION;  Surgeon: Laurey Morale, MD;  Location: [facility] ENDOSCOPY;  Service: Cardiovascular;  Laterality: N/A;    Current Outpatient Prescriptions  Medication Sig Dispense Refill  . acetaminophen (TYLENOL) 500 MG tablet Take 500 mg by mouth 2 (two) times daily as needed for moderate pain.    Marland Kitchen alendronate (FOSAMAX) 70 MG tablet Take 1 tablet (70 mg total)  by mouth every 7 (seven) days. Take with a full glass of water on an empty stomach. 4 tablet 11  . apixaban (ELIQUIS) 5 MG TABS tablet Take 1 tablet (5 mg total) by mouth 2 (two) times daily. 180 tablet 2  . atorvastatin (LIPITOR) 10 MG tablet TAKE 1 TABLET EVERY DAY 90 tablet 1  . cycloSPORINE (RESTASIS) 0.05 % ophthalmic emulsion Place 1 drop into both eyes daily.     Marland Kitchen diltiazem (CARDIZEM) 30 MG tablet Take 1 tablet every 4 hours AS NEEDED for afib HR>100 and BP>100 30 tablet 1  . hydrochlorothiazide (HYDRODIURIL) 25 MG tablet TAKE ONE TABLET BY MOUTH ONCE DAILY 30 tablet 3  . hydrocortisone-pramoxine (PROCTOFOAM HC) rectal foam Place 1 applicator rectally 2 (two) times daily. 10 g 0  . Lutein 20 MG CAPS Take 1 capsule by mouth daily.     . Multiple Vitamins-Minerals (ICAPS) CAPS Take 2 capsules by mouth daily.    . propranolol (INDERAL) 20 MG tablet Take 0.5 tablets (10 mg total) by mouth 2 (two) times daily.     No current facility-administered medications for this encounter.    Allergies  Allergen Reactions  . Valtrex [Valacyclovir Hcl] Palpitations    Chest pain.  Marland Kitchen Antihistamines, Chlorpheniramine-Type Other (See Comments)    Patient states it makes her feel like she's in "la la land"  . Evista [Raloxifene] Other (  See Comments)    Causes knots  . Feldene [Piroxicam] Other (See Comments)    unknown  . Toprol Xl [Metoprolol Tartrate]     Bad dreams  . Tropicamide     Dizziness, lethargic, non-responsive    Social History   Social History  . Marital Status: Widowed    Spouse Name: N/A  . Number of Children: N/A  . Years of Education: N/A   Occupational History  . Not on file.   Social History Main Topics  . Smoking status: Never Smoker   . Smokeless tobacco: Never Used  . Alcohol Use: No  . Drug Use: No  . Sexual Activity: Not on file   Other Topics Concern  . Not on file   Social History Narrative    Family History  Problem Relation Age of Onset  . Stroke     . Heart attack    . Heart disease Mother   . Stroke Mother     ane melanoma  . Heart disease Father   . Bladder Cancer Father   . Cancer Brother   . CAD Brother     ALL  . CAD Sister     X2    ROS- All systems are reviewed and negative except as per the HPI above  Physical Exam: Filed Vitals:   09/17/15 1051  BP: 140/64  Pulse: 99  Height: 5\' 4"  (1.626 m)  Weight: 150 lb 3.2 oz (68.13 kg)    GEN- The patient is well appearing, alert and oriented x 3 today.   Head- normocephalic, atraumatic Eyes-  Sclera clear, conjunctiva pink Ears- hearing intact Oropharynx- clear Neck- supple, no JVP Lymph- no cervical lymphadenopathy Lungs- Clear to ausculation bilaterally, normal work of breathing Heart- Regular rate and rhythm, no murmurs, rubs or gallops, PMI not laterally displaced GI- soft, NT, ND, + BS Extremities- no clubbing, cyanosis, or edema MS- no significant deformity or atrophy Skin- no rash or lesion Psych- euthymic mood, full affect Neuro- strength and sensation are intact  EKG-afib at 99 bpm, IRBBB, qrs int 96 ms, qtc 392 ms  Assessment and Plan: 1.  Chronic afib, s/p failure of amiodarone/DCCV to restore SR Feels better off amiodarone, tremor resolved and will restart propanolol at 10 mg bid and if v rates are not in the 70-80 range increase to 20 mg bid. Continue apixaban  5 mg bid  F/u as scheduled with Dr. Shirlee LatchMcLean as scheduled 2/16 Afib clinic as needed

## 2015-09-17 NOTE — Patient Instructions (Signed)
Your physician has recommended you make the following change in your medication:  1)Propranolol 10mg  twice daily for 3 days - if HR not sufficiently controlled increase to 20mg  twice a day.

## 2015-09-23 ENCOUNTER — Telehealth (HOSPITAL_COMMUNITY): Payer: Self-pay | Admitting: *Deleted

## 2015-09-23 NOTE — Telephone Encounter (Signed)
Patient daughter Darel HongJudy calling in BP/HR log for past 5 days since restarting propranolol 10mg  BID.  SAT 125/83 HR 86; SUN 120/71 HR 82; MON 134/96 HR 86, 117/93 HR 90; TUES 123/81 HR87, 106/68 HR 90, WED 140/96 HR 94, 133/83 HR 92, THURS 143/83 HR 68, 105/65 HR 84. Discussed with Rudi Cocoonna Carroll NP and would prefer to monitor BP for few more days before possibly increasing dose of propranolol.  Daughter will call back on Monday with BP/HR log.

## 2015-09-27 ENCOUNTER — Telehealth (HOSPITAL_COMMUNITY): Payer: Self-pay | Admitting: *Deleted

## 2015-09-27 MED ORDER — PROPRANOLOL HCL 10 MG PO TABS: 10.0000 mg | ORAL_TABLET | Freq: Three times a day (TID) | ORAL | Status: DC

## 2015-09-27 NOTE — Telephone Encounter (Signed)
Patient daughter, Darel HongJudy, calling in with updated BP/HR log sat 129/77; 102, 123/78; 96 sun 137/81;94, 116/58; 90, mon 137/83; 96, 130/84; 87.  Discussed with Rudi Cocoonna Carroll NP will increase inderal to 10mg  three times a day. Daughter will call back on Friday with how patient tolerated increased dose.

## 2015-10-07 ENCOUNTER — Telehealth (HOSPITAL_COMMUNITY): Payer: Self-pay | Admitting: *Deleted

## 2015-10-07 MED ORDER — PROPRANOLOL HCL 10 MG PO TABS: ORAL_TABLET | ORAL | Status: DC

## 2015-10-07 NOTE — Telephone Encounter (Signed)
Pt daughter called in with BP/HR log averages over last week or so.  Pt is taking BP many times a day before and after each dose inderal.  States "just feels more tired and legs feel weak" as the day goes on. BP ranges from 90-130s/60-80s HR 85-99 over course of day.  Discussed with Rudi Coco NP -- will decrease to Inderal  in the morning and  in the evening. To report back if no improvement. Daughter verbalized understanding.

## 2015-10-14 ENCOUNTER — Telehealth (HOSPITAL_COMMUNITY): Payer: Self-pay | Admitting: *Deleted

## 2015-10-14 NOTE — Telephone Encounter (Signed)
Pt daughter, Darel Hong, called in with report of patient's BP/HR log since changing propranolol dosing to  in the AM and  in the PM.  Tuesday prior to meds 121/84;84, after 106/62; 98 evening 126/74;93 Wednesday prior to meds 140/75;99 after 127/92;86, evenig 94/67; 88 Today prior to meds 117/66; 123, after 116/92;111. Patient reports feeling more weak today with inc heart rate. Encouraged pt to take 1/2 -1 tablet of  cardizem for HR>100. If inc HR become frequent may have to look into daily cardizem at a low dose. Daughter will keep track of BP/HR and call back if further issues -- recommended for now keep propranolol at current dose and use PRN cardizem for increased rates.

## 2015-10-19 ENCOUNTER — Other Ambulatory Visit (HOSPITAL_COMMUNITY): Payer: Self-pay | Admitting: Podiatry

## 2015-10-19 DIAGNOSIS — L03032 Cellulitis of left toe: Secondary | ICD-10-CM

## 2015-10-19 DIAGNOSIS — L97411 Non-pressure chronic ulcer of right heel and midfoot limited to breakdown of skin: Secondary | ICD-10-CM

## 2015-10-25 ENCOUNTER — Ambulatory Visit (HOSPITAL_COMMUNITY)
Admission: RE | Admit: 2015-10-25 | Discharge: 2015-10-25 | Disposition: A | Discharge status: Home / Self Care | Payer: Medicare Other | Source: Ambulatory Visit | Attending: Podiatry | Admitting: Podiatry

## 2015-10-25 DIAGNOSIS — L03032 Cellulitis of left toe: Secondary | ICD-10-CM | POA: Insufficient documentation

## 2015-10-28 ENCOUNTER — Encounter: Payer: Self-pay | Admitting: Cardiology

## 2015-10-28 ENCOUNTER — Ambulatory Visit (INDEPENDENT_AMBULATORY_CARE_PROVIDER_SITE_OTHER): Payer: Medicare Other | Admitting: Cardiology

## 2015-10-28 VITALS — BP 132/78 | HR 88 | Ht 64.0 in | Wt 149.0 lb

## 2015-10-28 DIAGNOSIS — I482 Chronic atrial fibrillation, unspecified: Secondary | ICD-10-CM

## 2015-10-28 DIAGNOSIS — I10 Essential (primary) hypertension: Secondary | ICD-10-CM

## 2015-10-28 MED ORDER — PROPRANOLOL HCL 20 MG PO TABS: 20.0000 mg | ORAL_TABLET | Freq: Two times a day (BID) | ORAL | Status: DC

## 2015-10-28 MED ORDER — ATORVASTATIN CALCIUM 10 MG PO TABS: 10.0000 mg | ORAL_TABLET | Freq: Every day | ORAL | Status: DC

## 2015-10-28 MED ORDER — HYDROCHLOROTHIAZIDE 25 MG PO TABS: 25.0000 mg | ORAL_TABLET | Freq: Every day | ORAL | Status: DC

## 2015-10-28 MED ORDER — APIXABAN 5 MG PO TABS: 5.0000 mg | ORAL_TABLET | Freq: Two times a day (BID) | ORAL | Status: DC

## 2015-10-28 NOTE — Patient Instructions (Signed)
Medication Instructions:  Your physician has recommended you make the following change in your medication:  1) INCREASE Propranolol to 20 mg twice a day  Labwork: None ordered  Testing/Procedures: None ordered  Follow-Up: Your physician recommends that you schedule a follow-up appointment in: 4 months with Dr. Shirlee Latch.  If you need a refill on your cardiac medications before your next appointment, please call your pharmacy.  Thank you for choosing [facility]!!

## 2015-10-28 NOTE — Progress Notes (Signed)
Patient ID: Julie Swanson, female   DOB: 03-Jun-1928, 82 y.o.   MRN: [id number] PCP: Dr. Lilyan Punt  82 yo presents for followup of paroxysmal atrial fibrillation.  She had been having runs of rapid tachypalpitations when I saw her initially.  She was quite symptomatic with episodes.  Event monitor showed runs of atrial fibrillation with RVR.  I started her on Multaq for rhythm control given her significant symptoms and on apixaban.  She did not tolerate diltiazem due to ankle swelling and was switched over to Coreg for rate control.  She did not tolerate Coreg and is back on propranolol, which she does ok with.  Echo in 11/14 showed EF 60-65% with mild MR. No exertional chest pain or dyspnea. No lightheadedness.  She went into atrial fibrillation again in 8/16.  She had DCCV in 9/16 back to NSR and was continued on Multaq.  However, she went back into atrial fibrillation shortly after on despite Multaq use.  Multaq was stopped.  Given ongoing fatigue with atrial fibrillation, she was started on amiodarone.  DCCV was done in 12/16, she went back into NSR.  Propranolol was stopped due to bradycardia.  Unfortunately, atrial fibrillation has recurred.  She saw Tereso Newcomer and it was decided to try to reload her on amiodarone and attempt DCCV one more time.  Later in 12/16, I cardioverted her again while on amiodarone.  However, at 09/10/15 appt in atrial fibrillation clinic, she was back in atrial fibrillation.  She developed a tremor on amiodarone.  Amiodarone was stopped with plan to rate control/anticoagulate going forward.  Propranolol was restarted .  Currently doing ok.  Remains in atrial fibrillation.  HR occasionally > 100 bpm.  Taking propranolol 15 qam/10 qpm. No dyspnea walking on flat ground.  Gets tired easily.  Limited by toe pain, had infected toenail recently that had to be removed. No lightheadedness or chest pain. Legs give out walking up a hill or incline.  ABIs were normal in 2/17 but there  was "developing fem-pop disease."  Tremor has resolved off amiodarone. No further BRBPR since hemorrhoids were banded.  Labs (10/14): LDL 90, HDL 41, K 4.1, creatinine 0.9 Labs (4/15): LDL 74, HDL 40, K 4.1, creatinine 4.09 Labs (10/15): LDL 73, HDL 38 Labs (12/15): K 4, creatinine 0.9, HCT 36 Labs (8/16): K 4, creatinine 0.97, HCT 37.8, TSH normal Labs (9/16): K 4.6, creatinine 1.06, LDL 68, HDL 47 Labs (11/16): TSH normal, HCT 34.1, LFTs normal Labs (12/16): creatinine 0.96, HCT 35.9  PMH: 1. LHC (8/10) with minimal nonobstructive disease.  ETT (10/13) with 2'53" exercise, nonspecific ECG changes with no evidence for ischemia.  2. Mitral valve prolapse: Echo (11/14) with EF 60-65%, mild MR.  3. Atrial fibrillation:  Event monitor (10/13) with PACs only.  Event monitor (11/14) showed atrial fibrillation with RVR. She does not tolerate metoprolol (nightmares).  She had ankle swelling with diltiazem. Possible fatigue/diarrhea with Coreg. Holter (8/16) with persistent atrial fibrillation.  DCCV to NSR in 9/16.  Back in atrial fibrillation by 10/16 despite Multaq. DCCV to NSR x 2 in 12/16 on amiodarone, but atrial fibrillation recurred.  Amiodarone stopped. 4. HTN 5. Hyperlipidemia 6. Hysterectomy. 7. Carotid disease: Carotid dopplers (1/15) with prominent plaque, mild stenosis.  8. Internal hemorrhoids with episodic rectal bleeding.  9. Macular degeneration.  10. PFTs: Mild obstructive lung disease.  11. PAD: ABIs (2/17) were normal, but there was evidence for "developing fem-pop disease."   SH: Widow, lives in [place], nonsmoker.  2 daughters.   FH: Mother with CVA, CAD.  Father with CAD.   ROS: All systems reviewed and negative except as per HPI.   Current Outpatient Prescriptions  Medication Sig Dispense Refill  . acetaminophen (TYLENOL) 500 MG tablet Take 500 mg by mouth 2 (two) times daily as needed for moderate pain.    Marland Kitchen alendronate (FOSAMAX) 70 MG tablet Take 1 tablet (70 mg  total) by mouth every 7 (seven) days. Take with a full glass of water on an empty stomach. 4 tablet 11  . apixaban (ELIQUIS) 5 MG TABS tablet Take 1 tablet (5 mg total) by mouth 2 (two) times daily. 180 tablet 3  . atorvastatin (LIPITOR) 10 MG tablet Take 1 tablet (10 mg total) by mouth daily. 90 tablet 3  . cycloSPORINE (RESTASIS) 0.05 % ophthalmic emulsion Place 1 drop into both eyes daily.     Marland Kitchen diltiazem (CARDIZEM) 30 MG tablet Take 1 tablet every 4 hours AS NEEDED for afib HR>100 and BP>100 30 tablet 1  . hydrochlorothiazide (HYDRODIURIL) 25 MG tablet Take 1 tablet (25 mg total) by mouth daily. 90 tablet 3  . hydrocortisone-pramoxine (PROCTOFOAM HC) rectal foam Place 1 applicator rectally 2 (two) times daily. 10 g 0  . Lutein 20 MG CAPS Take 1 capsule by mouth daily.     . Multiple Vitamins-Minerals (ICAPS) CAPS Take 2 capsules by mouth daily.    . propranolol (INDERAL) 20 MG tablet Take 1 tablet (20 mg total) by mouth 2 (two) times daily. 180 tablet 2   No current facility-administered medications for this visit.    BP 132/78 mmHg  Pulse 88  Ht  (1.626 m)  Wt 149 lb (67.586 kg)  BMI 25.56 kg/m2 General: NAD Neck: No JVD, no thyromegaly or thyroid nodule.  Lungs: Clear to auscultation bilaterally with normal respiratory effort. CV: Nondisplaced PMI.  Heart irregular S1/S2, no S3/S4, no murmur.  Trace ankle edema. Right carotid bruit. Normal pedal pulses.  Abdomen: Soft, nontender, no hepatosplenomegaly, no distention.  Skin: Intact without lesions or rashes.  Neurologic: Alert and oriented x 3.  Psych: Normal affect. Extremities: No clubbing or cyanosis.   Assessment/Plan: 1. Atrial fibrillation:  Persistent, has recurred after DCCV in 12/16 on amiodarone.  She has failed amiodarone and Multaq.  She is not a good sotalol or Tikosyn candidate as QTc has been prolonged.  We have decided on following a rate control and anticoagulation strategy. - Increase propranolol to 20 mg  bid for better rate control. - Continue Eliquis. Recent BMET/CBC ok. Rectal bleeding resolved with banding of hemorrhoids. 2. HTN:  BP ok today.    3. Hyperlipidemia: Good lipids in 9/16.  4. Carotid bruit: Mild stenosis only.  5. PAD: Goal LDL < 70 with PAD.  ABIs normal but evidence for developing fem-pop disease.  I do not think this is causing any symptoms currently.    Marca Ancona 10/28/2015

## 2015-11-17 ENCOUNTER — Ambulatory Visit (INDEPENDENT_AMBULATORY_CARE_PROVIDER_SITE_OTHER): Payer: Medicare Other | Admitting: Internal Medicine

## 2015-11-19 ENCOUNTER — Encounter: Payer: Self-pay | Admitting: Family Medicine

## 2015-11-19 ENCOUNTER — Ambulatory Visit (INDEPENDENT_AMBULATORY_CARE_PROVIDER_SITE_OTHER): Payer: Medicare Other | Admitting: Family Medicine

## 2015-11-19 VITALS — BP 124/80 | Ht 64.0 in | Wt 151.0 lb

## 2015-11-19 DIAGNOSIS — I4891 Unspecified atrial fibrillation: Secondary | ICD-10-CM | POA: Diagnosis not present

## 2015-11-19 DIAGNOSIS — I10 Essential (primary) hypertension: Secondary | ICD-10-CM | POA: Diagnosis not present

## 2015-11-19 NOTE — Progress Notes (Signed)
   Subjective:    Patient ID: Julie Swanson, female    DOB: 1928/06/14, 82 y.o.   MRN: [id number]  Hypertension This is a chronic problem. The current episode started more than 1 year ago. The problem has been gradually improving since onset. There are no associated agents to hypertension. There are no known risk factors for coronary artery disease. Treatments tried: HCTZ. The current treatment provides moderate improvement. There are no compliance problems.    Patient states that she has no concerns at this time.  Patient still has atrial fibrillation slightly frustrated about it Review of Systems    denies any chest tightness pressure pain at times heart does run fast denies woozy or drowsiness or dizziness Objective:   Physical Exam Lungs clear heart irregular pulse and heart rate seemed to be controlled. Extremities no edema BP good sitting and standing walking without ataxia       Assessment & Plan:  HTN decent control continue current measures Atrial fibrillation heart rate control Follow-up within the approximate 6 months lab work at that time

## 2015-11-22 ENCOUNTER — Other Ambulatory Visit: Payer: Self-pay | Admitting: Family Medicine

## 2016-01-17 ENCOUNTER — Telehealth: Payer: Self-pay | Admitting: Family Medicine

## 2016-01-17 DIAGNOSIS — D649 Anemia, unspecified: Secondary | ICD-10-CM

## 2016-01-17 DIAGNOSIS — E785 Hyperlipidemia, unspecified: Secondary | ICD-10-CM

## 2016-01-17 DIAGNOSIS — I10 Essential (primary) hypertension: Secondary | ICD-10-CM

## 2016-01-17 NOTE — Telephone Encounter (Signed)
Met 7, lipid, liver, CBC-anemia, hyperlipidemia hypertension

## 2016-01-17 NOTE — Telephone Encounter (Signed)
Blood work ordered in EPIC. Patient notified. 

## 2016-01-17 NOTE — Telephone Encounter (Signed)
bw orders for upcoming appt  Last labs 08/26/15 CBC, BMP 08/02/15 TSH, Hep, CBC

## 2016-02-01 LAB — CBC WITH DIFFERENTIAL/PLATELET
BASOS: 0 %
Basophils Absolute: 0 10*3/uL (ref 0.0–0.2)
EOS (ABSOLUTE): 0.2 10*3/uL (ref 0.0–0.4)
Eos: 3 %
Hematocrit: 39.9 % (ref 34.0–46.6)
Hemoglobin: 13.4 g/dL (ref 11.1–15.9)
IMMATURE GRANS (ABS): 0 10*3/uL (ref 0.0–0.1)
IMMATURE GRANULOCYTES: 0 %
LYMPHS: 26 %
Lymphocytes Absolute: 1.3 10*3/uL (ref 0.7–3.1)
MCH: 30.6 pg (ref 26.6–33.0)
MCHC: 33.6 g/dL (ref 31.5–35.7)
MCV: 91 fL (ref 79–97)
MONOS ABS: 0.4 10*3/uL (ref 0.1–0.9)
Monocytes: 8 %
NEUTROS PCT: 63 %
Neutrophils Absolute: 3.3 10*3/uL (ref 1.4–7.0)
PLATELETS: 219 10*3/uL (ref 150–379)
RBC: 4.38 x10E6/uL (ref 3.77–5.28)
RDW: 13.9 % (ref 12.3–15.4)
WBC: 5.2 10*3/uL (ref 3.4–10.8)

## 2016-02-01 LAB — HEPATIC FUNCTION PANEL
ALBUMIN: 4.2 g/dL (ref 3.5–4.7)
ALK PHOS: 63 IU/L (ref 39–117)
ALT: 12 IU/L (ref 0–32)
AST: 17 IU/L (ref 0–40)
Bilirubin Total: 0.7 mg/dL (ref 0.0–1.2)
Bilirubin, Direct: 0.16 mg/dL (ref 0.00–0.40)
TOTAL PROTEIN: 6.8 g/dL (ref 6.0–8.5)

## 2016-02-01 LAB — BASIC METABOLIC PANEL
BUN / CREAT RATIO: 15 (ref 12–28)
BUN: 15 mg/dL (ref 8–27)
CHLORIDE: 99 mmol/L (ref 96–106)
CO2: 26 mmol/L (ref 18–29)
Calcium: 9 mg/dL (ref 8.7–10.3)
Creatinine, Ser: 0.99 mg/dL (ref 0.57–1.00)
GFR calc non Af Amer: 52 mL/min/{1.73_m2} — ABNORMAL LOW (ref >59)
GFR, EST AFRICAN AMERICAN: 60 mL/min/{1.73_m2} (ref >59)
Glucose: 101 mg/dL — ABNORMAL HIGH (ref 65–99)
POTASSIUM: 4.1 mmol/L (ref 3.5–5.2)
Sodium: 142 mmol/L (ref 134–144)

## 2016-02-01 LAB — LIPID PANEL
Chol/HDL Ratio: 3.9 ratio units (ref 0.0–4.4)
Cholesterol, Total: 147 mg/dL (ref 100–199)
HDL: 38 mg/dL — AB (ref >39)
LDL CALC: 77 mg/dL (ref 0–99)
Triglycerides: 161 mg/dL — ABNORMAL HIGH (ref 0–149)
VLDL CHOLESTEROL CAL: 32 mg/dL (ref 5–40)

## 2016-02-02 ENCOUNTER — Encounter: Payer: Self-pay | Admitting: Family Medicine

## 2016-02-02 ENCOUNTER — Ambulatory Visit (INDEPENDENT_AMBULATORY_CARE_PROVIDER_SITE_OTHER): Payer: Medicare Other | Admitting: Family Medicine

## 2016-02-02 VITALS — BP 136/82 | Ht 64.0 in | Wt 153.6 lb

## 2016-02-02 DIAGNOSIS — I482 Chronic atrial fibrillation, unspecified: Secondary | ICD-10-CM

## 2016-02-02 DIAGNOSIS — E785 Hyperlipidemia, unspecified: Secondary | ICD-10-CM | POA: Diagnosis not present

## 2016-02-02 DIAGNOSIS — M810 Age-related osteoporosis without current pathological fracture: Secondary | ICD-10-CM | POA: Diagnosis not present

## 2016-02-02 DIAGNOSIS — L309 Dermatitis, unspecified: Secondary | ICD-10-CM | POA: Diagnosis not present

## 2016-02-02 MED ORDER — ALENDRONATE SODIUM 70 MG PO TABS: 70.0000 mg | ORAL_TABLET | ORAL | Status: DC

## 2016-02-02 MED ORDER — TRIAMCINOLONE ACETONIDE 0.1 % EX CREA: 1.0000 "application " | TOPICAL_CREAM | Freq: Two times a day (BID) | CUTANEOUS | Status: DC

## 2016-02-02 NOTE — Progress Notes (Signed)
   Subjective:    Patient ID: Julie SageNancy S Frey, female    DOB: October 16, 1927, 82 y.o.   MRN: [id number]  Hypertension This is a chronic problem. The current episode started more than 1 year ago. Pertinent negatives include no chest pain. Risk factors for coronary artery disease include dyslipidemia and post-menopausal state. Treatments tried: HCTZ, propranolol, cardizem. There are no compliance problems.    Patient reports problems with back painIntermittent back pain not severe does not cause sciatica Patient is not had any falls she has not had any injuries. Denies being depressed Patient takes blood thinner not having any bleeding issues Atrial fibrillation still no control on this is separate does keep the rate under control She does have osteoporosis she takes a 5 Max her be due for bone density coming up Recent lab work reviewed in detail with patient Review of Systems  Constitutional: Negative for activity change, appetite change and fatigue.  HENT: Negative for congestion.   Respiratory: Negative for cough.   Cardiovascular: Negative for chest pain.  Gastrointestinal: Negative for abdominal pain.  Endocrine: Negative for polydipsia and polyphagia.  Neurological: Negative for weakness.  Psychiatric/Behavioral: Negative for confusion.       Objective:   Physical Exam  Constitutional: She appears well-nourished. No distress.  Cardiovascular: Normal rate and normal heart sounds.   No murmur heard. Pulmonary/Chest: Effort normal and breath sounds normal. No respiratory distress.  Musculoskeletal: She exhibits no edema.  Lymphadenopathy:    She has no cervical adenopathy.  Neurological: She is alert. She exhibits normal muscle tone.  Psychiatric: Her behavior is normal.  Vitals reviewed.  A fib rate 100 bp 110/70 sitting and 100/60 standing  25 minutes was spent with the patient. Greater than half the time was spent in discussion and answering questions and counseling regarding  the issues that the patient came in for today.      Assessment & Plan:  Caution with standingPatient does have some mild orthostasis when she stands she needs to take caution with standing Osteoporosis continue Fosamax. Not due for bone density just yet Hyperlipidemia continue current medication overall tolerating medication well recent lab work reviewed Dermatitis on the back of the scalp recommend steroid cream her insurance will not pay for the steroid looked would She is on anticoagulant for atrial fib this is under good control she was advised in detail not to go up any ladders or anywhere where she could fall.

## 2016-03-07 ENCOUNTER — Encounter: Payer: Self-pay | Admitting: Cardiology

## 2016-03-07 ENCOUNTER — Ambulatory Visit (INDEPENDENT_AMBULATORY_CARE_PROVIDER_SITE_OTHER): Payer: Medicare Other | Admitting: Cardiology

## 2016-03-07 VITALS — BP 138/88 | HR 58 | Ht 64.5 in | Wt 153.1 lb

## 2016-03-07 DIAGNOSIS — I4891 Unspecified atrial fibrillation: Secondary | ICD-10-CM | POA: Diagnosis not present

## 2016-03-07 DIAGNOSIS — I10 Essential (primary) hypertension: Secondary | ICD-10-CM

## 2016-03-07 DIAGNOSIS — E785 Hyperlipidemia, unspecified: Secondary | ICD-10-CM

## 2016-03-07 DIAGNOSIS — I739 Peripheral vascular disease, unspecified: Secondary | ICD-10-CM

## 2016-03-07 DIAGNOSIS — I6523 Occlusion and stenosis of bilateral carotid arteries: Secondary | ICD-10-CM | POA: Diagnosis not present

## 2016-03-07 MED ORDER — DILTIAZEM HCL ER COATED BEADS 120 MG PO CP24: 120.0000 mg | ORAL_CAPSULE | Freq: Every day | ORAL | Status: DC

## 2016-03-07 NOTE — Patient Instructions (Addendum)
Medication Instructions:  Your physician has recommended you make the following change in your medication:  1) Cardizem 120 mg daily   Labwork: None ordered   Testing/Procedures: None ordered   Follow-Up:  Your physician recommends that you schedule a follow-up appointment in: 6 weeks with Dr Earlean ShawlMcLean  You have been referred to Dr Arida---PVD    Any Other Special Instructions Will Be Listed Below (If Applicable).     If you need a refill on your cardiac medications before your next appointment, please call your pharmacy.

## 2016-03-09 DIAGNOSIS — I739 Peripheral vascular disease, unspecified: Secondary | ICD-10-CM | POA: Insufficient documentation

## 2016-03-09 NOTE — Progress Notes (Signed)
Patient ID: Carroll SageNancy S Kraai, female   DOB: 1928-05-25, 82 y.o.   MRN: [id number] PCP: Dr. Lilyan PuntScott Luking  82 yo presents for followup of paroxysmal atrial fibrillation.  She had been having runs of rapid tachypalpitations when I saw her initially.  She was quite symptomatic with episodes.  Event monitor showed runs of atrial fibrillation with RVR.  I started her on Multaq for rhythm control given her significant symptoms and on apixaban.  She did not tolerate diltiazem due to ankle swelling and was switched over to Coreg for rate control.  She did not tolerate Coreg and is back on propranolol, which she does ok with.  Echo in 11/14 showed EF 60-65% with mild MR. No exertional chest pain or dyspnea. No lightheadedness.  She went into atrial fibrillation again in 8/16.  She had DCCV in 9/16 back to NSR and was continued on Multaq.  However, she went back into atrial fibrillation shortly after on despite Multaq use.  Multaq was stopped.  Given ongoing fatigue with atrial fibrillation, she was started on amiodarone.  DCCV was done in 12/16, she went back into NSR.  Propranolol was stopped due to bradycardia.  Unfortunately, atrial fibrillation has recurred.  She saw [name] and it was decided to try to reload her on amiodarone and attempt DCCV one more time.  Later in 12/16, I cardioverted her again while on amiodarone.  However, at 09/10/15 appt in atrial fibrillation clinic, she was back in atrial fibrillation.  She developed a tremor on amiodarone.  Amiodarone was stopped with plan to rate control/anticoagulate going forward.  Propranolol was restarted .  Currently doing ok.  Remains in atrial fibrillation.  HR tends to be 100 bpm or above, but is in the 80s today. No lightheadedness or chest pain. No exertional dyspnea.  Legs "wear out" just walking to mailbox, she is most limited by leg weakness.  ABIs were normal in 2/17 but there was "developing fem-pop disease."  Rare hemorrhoidal-type bleeding.    ECG:  Atrial fibrillation at 87 with septal Qs  Labs (10/14): LDL 90, HDL 41, K 4.1, creatinine 0.9 Labs (4/15): LDL 74, HDL 40, K 4.1, creatinine 4.090.87 Labs (10/15): LDL 73, HDL 38 Labs (12/15): K 4, creatinine 0.9, HCT 36 Labs (8/16): K 4, creatinine 0.97, HCT 37.8, TSH normal Labs (9/16): K 4.6, creatinine 1.06, LDL 68, HDL 47 Labs (11/16): TSH normal, HCT 34.1, LFTs normal Labs (12/16): creatinine 0.96, HCT 35.9 Labs (5/17): K 4.1, creatinine 0.99, LDL 77, HDL 38  PMH: 1. LHC (8/10) with minimal nonobstructive disease.  ETT (10/13) with 2'53" exercise, nonspecific ECG changes with no evidence for ischemia.  2. Mitral valve prolapse: Echo (11/14) with EF 60-65%, mild MR.  3. Atrial fibrillation:  Event monitor (10/13) with PACs only.  Event monitor (11/14) showed atrial fibrillation with RVR. She does not tolerate metoprolol (nightmares).  She had ankle swelling with diltiazem. Possible fatigue/diarrhea with Coreg. Holter (8/16) with persistent atrial fibrillation.  DCCV to NSR in 9/16.  Back in atrial fibrillation by 10/16 despite Multaq. DCCV to NSR x 2 in 12/16 on amiodarone, but atrial fibrillation recurred.  Amiodarone stopped. 4. HTN 5. Hyperlipidemia 6. Hysterectomy. 7. Carotid disease: Carotid dopplers (1/15) with prominent plaque, mild stenosis.  8. Internal hemorrhoids with episodic rectal bleeding.  9. Macular degeneration.  10. PFTs: Mild obstructive lung disease.  11. PAD: ABIs (2/17) were normal, but there was evidence for "developing fem-pop disease."   SH: Widow, lives in [place], nonsmoker.  2 daughters.   FH: Mother with CVA, CAD.  Father with CAD.   ROS: All systems reviewed and negative except as per HPI.   Current Outpatient Prescriptions  Medication Sig Dispense Refill  . acetaminophen (TYLENOL) 500 MG tablet Take 500 mg by mouth 2 (two) times daily as needed for moderate pain.    Marland Kitchen. alendronate (FOSAMAX) 70 MG tablet Take 1 tablet (70 mg total) by mouth every 7  (seven) days. Take with a full glass of water on an empty stomach. 12 tablet 1  . apixaban (ELIQUIS) 5 MG TABS tablet Take 1 tablet (5 mg total) by mouth 2 (two) times daily. 180 tablet 3  . atorvastatin (LIPITOR) 10 MG tablet Take 1 tablet (10 mg total) by mouth daily. 90 tablet 3  . cycloSPORINE (RESTASIS) 0.05 % ophthalmic emulsion Place 1 drop into both eyes daily.     Marland Kitchen. diltiazem (CARDIZEM) 30 MG tablet Take 1 tablet by mouth every 4 hours AS NEEDED for afib HR>100 and BP>100    . hydrochlorothiazide (HYDRODIURIL) 25 MG tablet Take 1 tablet (25 mg total) by mouth daily. 90 tablet 3  . Lutein 20 MG CAPS Take 1 capsule by mouth daily.     . Multiple Vitamins-Minerals (ICAPS) CAPS Take 2 capsules by mouth daily.    . propranolol (INDERAL) 20 MG tablet Take 1 tablet (20 mg total) by mouth 2 (two) times daily. 180 tablet 2  . triamcinolone cream (KENALOG) 0.1 % Apply 1 application topically 2 (two) times daily as needed (neck).    Marland Kitchen. diltiazem (CARDIZEM CD) 120 MG 24 hr capsule Take 1 capsule (120 mg total) by mouth daily. 90 capsule 3  . hydrocortisone-pramoxine (PROCTOFOAM HC) rectal foam Place 1 applicator rectally 2 (two) times daily. (Patient not taking: Reported on 03/07/2016) 10 g 0   No current facility-administered medications for this visit.    BP 138/88 mmHg  Pulse 58  Ht 5' 4.5" (1.638 m)  Wt 153 lb 1.9 oz (69.455 kg)  BMI 25.89 kg/m2  SpO2 99% General: NAD Neck: No JVD, no thyromegaly or thyroid nodule.  Lungs: Clear to auscultation bilaterally with normal respiratory effort. CV: Nondisplaced PMI.  Heart irregular S1/S2, no S3/S4, no murmur.  1+ ankle edema. Right carotid bruit. Normal pedal pulses.  Abdomen: Soft, nontender, no hepatosplenomegaly, no distention.  Skin: Intact without lesions or rashes.  Neurologic: Alert and oriented x 3.  Psych: Normal affect. Extremities: No clubbing or cyanosis.   Assessment/Plan: 1. Atrial fibrillation:  Persistent, has recurred after  DCCV in 12/16 on amiodarone.  She has failed amiodarone and Multaq.  She is not a good sotalol or Tikosyn candidate as QTc has been prolonged.  We have decided on following a rate control and anticoagulation strategy. - Continue propranolol 20 mg bid. - As HR tends to stay mildly elevated, I will add diltiazem CD 120 mg daily.   - Continue Eliquis. Recent BMET/CBC ok.  2. HTN:  BP ok today.    3. Hyperlipidemia: Good lipids in 5/17. Goal LDL < 70 with PAD.   4. Carotid bruit: Mild stenosis only.  5. PAD: ABIs normal but evidence for developing fem-pop disease.  Her greatest limitation at this point is her leg fatigue/weakness even when walking a relatively short distance.  I will refer her for PV evaluation.   Marca AnconaDalton Daryel Kenneth 03/09/2016

## 2016-03-13 ENCOUNTER — Telehealth: Payer: Self-pay | Admitting: Cardiology

## 2016-03-13 NOTE — Telephone Encounter (Signed)
New Message  Pt dtr calling to speak w/ RN- wanted to see if stopping Eliquis will help stop pt rectal bleeding. Pt dtr also mentioned the new drug- cartiaXT- may be related to the bleeding as well. Please call back and discuss.

## 2016-03-13 NOTE — Telephone Encounter (Signed)
I spoke with pt's daughter, Darel HongJudy.  Darel HongJudy states pt has had rectal bleeding for 2 days.  Darel HongJudy states Dr Maisie Fushomas, that has done banding for hemorrhoids, has recommended increase fiber in diet and  suppositories to help control bleeding.

## 2016-03-13 NOTE — Telephone Encounter (Signed)
Darel HongJudy states Dr Maisie Fushomas has suggested that if these recommendations do not control rectal bleeding in 48 hours pt may need to be off Eliquis for a period of time.  Darel HongJudy is asking for Dr Alford HighlandMcLean's opinion about risk of holding Eliquis if necessary to control rectal bleeding. Darel HongJudy advised I will forward to Dr Shirlee LatchMcLean for review.

## 2016-03-13 NOTE — Telephone Encounter (Signed)
Doubt diltiazem is playing much of a role in bleeding.  If it does not stop, can hold Eliquis x 2 days.

## 2016-03-15 NOTE — Telephone Encounter (Signed)
Pt's daughter,[name] advised Dr Shirlee LatchMcLean said okay to hold Eliquis for 2 days if necessary. Darel HongJudy states rectal bleeding has improved but not completely resolved, pt has appt with Dr Maisie Fushomas 03/20/16.

## 2016-04-11 ENCOUNTER — Institutional Professional Consult (permissible substitution): Payer: Medicare Other | Admitting: Cardiovascular Disease

## 2016-04-28 ENCOUNTER — Encounter: Payer: Self-pay | Admitting: Cardiology

## 2016-04-28 ENCOUNTER — Encounter (INDEPENDENT_AMBULATORY_CARE_PROVIDER_SITE_OTHER): Payer: Self-pay

## 2016-04-28 ENCOUNTER — Ambulatory Visit (INDEPENDENT_AMBULATORY_CARE_PROVIDER_SITE_OTHER): Payer: Medicare Other | Admitting: Cardiology

## 2016-04-28 VITALS — BP 146/86 | HR 93 | Ht 64.5 in | Wt 151.6 lb

## 2016-04-28 DIAGNOSIS — I6523 Occlusion and stenosis of bilateral carotid arteries: Secondary | ICD-10-CM

## 2016-04-28 DIAGNOSIS — I739 Peripheral vascular disease, unspecified: Secondary | ICD-10-CM | POA: Diagnosis not present

## 2016-04-28 DIAGNOSIS — I481 Persistent atrial fibrillation: Secondary | ICD-10-CM

## 2016-04-28 DIAGNOSIS — I4819 Other persistent atrial fibrillation: Secondary | ICD-10-CM

## 2016-04-28 MED ORDER — METOPROLOL SUCCINATE ER 25 MG PO TB24: 25.0000 mg | ORAL_TABLET | Freq: Two times a day (BID) | ORAL | 1 refills | Status: DC

## 2016-04-28 NOTE — Patient Instructions (Signed)
Medication Instructions:  Stop propranolol.  Start Toprol XL 25mg  two times a day  Labwork: None   Testing/Procedures: none  Follow-Up: Your physician recommends that you schedule a follow-up appointment in: 6 weeks with Dr Shirlee LatchMcLean   Any Other Special Instructions Will Be Listed Below (If Applicable). Use knee high compression stockings to help the swelling in your feet and legs. Put them on in the morning and take them off at night.    If you need a refill on your cardiac medications before your next appointment, please call your pharmacy.

## 2016-04-30 NOTE — Progress Notes (Addendum)
Patient ID: Julie SageNancy S Swanson, female   DOB: April 26, 1928, 82 y.o.   MRN: [id number] PCP: Dr. Lilyan PuntScott Luking  82 yo presents for followup of paroxysmal atrial fibrillation.  She had been having runs of rapid tachypalpitations when I saw her initially.  She was quite symptomatic with episodes.  Event monitor showed runs of atrial fibrillation with RVR.  I started her on Multaq for rhythm control given her significant symptoms and on apixaban.  She did not tolerate diltiazem due to ankle swelling and was switched over to Coreg for rate control.  She did not tolerate Coreg and is back on propranolol, which she does ok with.  Echo in 11/14 showed EF 60-65% with mild MR. No exertional chest pain or dyspnea. No lightheadedness.  She went into atrial fibrillation again in 8/16.  She had DCCV in 9/16 back to NSR and was continued on Multaq.  However, she went back into atrial fibrillation shortly after on despite Multaq use.  Multaq was stopped.  Given ongoing fatigue with atrial fibrillation, she was started on amiodarone.  DCCV was done in 12/16, she went back into NSR.  Propranolol was stopped due to bradycardia.  Unfortunately, atrial fibrillation has recurred.  She saw [name] and it was decided to try to reload her on amiodarone and attempt DCCV one more time.  Later in 12/16, I cardioverted her again while on amiodarone.  However, at 09/10/15 appt in atrial fibrillation clinic, she was back in atrial fibrillation.  She developed a tremor on amiodarone.  Amiodarone was stopped with plan to rate control/anticoagulate going forward.  Propranolol was restarted .  Currently doing ok.  Remains in atrial fibrillation.  HR tends to be 100 bpm or above, but is in the 90s today. No lightheadedness or chest pain. No exertional dyspnea but fatigues after walking about 1/2 mile. ABIs were normal in 2/17 but there was "developing fem-pop disease."  She has had hemorrhoidal bleeding and wants to get the hemorrhoids banded.  She  has developed peripheral edema since going on diltiazem CD for rate control.     ECG: Atrial fibrillation at 93, lateral TWIs.   Labs (10/14): LDL 90, HDL 41, K 4.1, creatinine 0.9 Labs (4/15): LDL 74, HDL 40, K 4.1, creatinine 4.090.87 Labs (10/15): LDL 73, HDL 38 Labs (12/15): K 4, creatinine 0.9, HCT 36 Labs (8/16): K 4, creatinine 0.97, HCT 37.8, TSH normal Labs (9/16): K 4.6, creatinine 1.06, LDL 68, HDL 47 Labs (11/16): TSH normal, HCT 34.1, LFTs normal Labs (12/16): creatinine 0.96, HCT 35.9 Labs (5/17): K 4.1, creatinine 0.99, LDL 77, HDL 38  PMH: 1. LHC (8/10) with minimal nonobstructive disease.  ETT (10/13) with 2'53" exercise, nonspecific ECG changes with no evidence for ischemia.  2. Mitral valve prolapse: Echo (11/14) with EF 60-65%, mild MR.  3. Atrial fibrillation:  Event monitor (10/13) with PACs only.  Event monitor (11/14) showed atrial fibrillation with RVR. She does not tolerate metoprolol (nightmares).  She had ankle swelling with diltiazem. Possible fatigue/diarrhea with Coreg. Holter (8/16) with persistent atrial fibrillation.  DCCV to NSR in 9/16.  Back in atrial fibrillation by 10/16 despite Multaq. DCCV to NSR x 2 in 12/16 on amiodarone, but atrial fibrillation recurred.  Amiodarone stopped. 4. HTN 5. Hyperlipidemia 6. Hysterectomy. 7. Carotid disease: Carotid dopplers (1/15) with prominent plaque, mild stenosis.  8. Internal hemorrhoids with episodic rectal bleeding.  9. Macular degeneration.  10. PFTs: Mild obstructive lung disease.  11. PAD: ABIs (2/17) were normal, but  there was evidence for "developing fem-pop disease."   SH: Widow, [place], nonsmoker.  2 daughters.   FH: Mother with CVA, CAD.  Father with CAD.   ROS: All systems reviewed and negative except as per HPI.   Current Outpatient Prescriptions  Medication Sig Dispense Refill  . acetaminophen (TYLENOL) 500 MG tablet Take 500 mg by mouth 2 (two) times daily as needed for moderate  pain.    Marland Kitchen. apixaban (ELIQUIS) 5 MG TABS tablet Take 1 tablet (5 mg total) by mouth 2 (two) times daily. 180 tablet 3  . atorvastatin (LIPITOR) 10 MG tablet Take 1 tablet (10 mg total) by mouth daily. 90 tablet 3  . cycloSPORINE (RESTASIS) 0.05 % ophthalmic emulsion Place 1 drop into both eyes daily.     Marland Kitchen. diltiazem (CARDIZEM CD) 120 MG 24 hr capsule Take 1 capsule (120 mg total) by mouth daily. 90 capsule 3  . hydrochlorothiazide (HYDRODIURIL) 25 MG tablet Take 1 tablet (25 mg total) by mouth daily. 90 tablet 3  . hydrocortisone-pramoxine (PROCTOFOAM-HC) rectal foam Place 1 applicator rectally 2 (two) times daily as needed for hemorrhoids or itching.    . Lutein 20 MG CAPS Take 1 capsule by mouth daily.     . Multiple Vitamins-Minerals (ICAPS) CAPS Take 2 capsules by mouth daily.    Marland Kitchen. OVER THE COUNTER MEDICATION Med Name: BENE-FIBER Take two (2) teaspoons by mouth daily with 8 oz water.    . triamcinolone cream (KENALOG) 0.1 % Apply 1 application topically 2 (two) times daily as needed (neck).    Marland Kitchen. alendronate (FOSAMAX) 70 MG tablet Take 1 tablet (70 mg total) by mouth every 7 (seven) days. Take with a full glass of water on an empty stomach. (Patient not taking: Reported on 04/28/2016) 12 tablet 1  . metoprolol succinate (TOPROL XL) 25 MG 24 hr tablet Take 1 tablet (25 mg total) by mouth 2 (two) times daily. 180 tablet 1   No current facility-administered medications for this visit.     BP (!) 146/86   Pulse 93   Ht 5' 4.5" (1.638 m)   Wt 151 lb 9.6 oz (68.8 kg)   BMI 25.62 kg/m  General: NAD Neck: No JVD, no thyromegaly or thyroid nodule.  Lungs: Clear to auscultation bilaterally with normal respiratory effort. CV: Nondisplaced PMI.  Heart irregular S1/S2, no S3/S4, no murmur.  1+ edema 3/4 to knees bilaterally. Right carotid bruit. Normal pedal pulses.  Abdomen: Soft, nontender, no hepatosplenomegaly, no distention.  Skin: Intact without lesions or rashes.  Neurologic: Alert and  oriented x 3.  Psych: Normal affect. Extremities: No clubbing or cyanosis.   Assessment/Plan: 1. Atrial fibrillation:  Persistent, has recurred after DCCV in 12/16 on amiodarone.  She has failed amiodarone and Multaq.  She is not a good sotalol or Tikosyn candidate as QTc has been prolonged.  We have decided on following a rate control and anticoagulation strategy. - HR remains high despite propranolol and diltiazem.  She will stop propranolol and try Toprol XL 25 mg bid.  She had nightmares with metoprolol tartrate once before, but it was a long time ago and she is willing to try again.  - Continue diltiazem CD for now, she can tolerate the ankle swelling which is fairly mild.  I recommended that she wear compression stockings.    - Continue Eliquis. Recent BMET/CBC ok.  - Would recommend that she go ahead and get hemorrhoids banded with occasional hemorrhoidal bleeding on Eliquis.  2. HTN:  BP ok today.    3. Hyperlipidemia: Good lipids in 5/17. Goal LDL < 70 with PAD.   4. Carotid bruit: Mild stenosis only.  5. PAD: ABIs normal but evidence for developing fem-pop disease.  She continues to have leg fatigue but says she can walk up to 1/2 mile.   Followup with me in 6 wks.  If edema worsens, will stop diltiazem.   Marca Ancona 04/30/2016

## 2016-05-01 ENCOUNTER — Telehealth: Payer: Self-pay | Admitting: Family Medicine

## 2016-05-01 ENCOUNTER — Encounter: Payer: Self-pay | Admitting: Family Medicine

## 2016-05-01 ENCOUNTER — Ambulatory Visit (INDEPENDENT_AMBULATORY_CARE_PROVIDER_SITE_OTHER): Payer: Medicare Other | Admitting: Family Medicine

## 2016-05-01 VITALS — BP 120/70 | Temp 98.0°F | Ht 64.0 in | Wt 152.5 lb

## 2016-05-01 DIAGNOSIS — R1032 Left lower quadrant pain: Secondary | ICD-10-CM

## 2016-05-01 DIAGNOSIS — K862 Cyst of pancreas: Secondary | ICD-10-CM

## 2016-05-01 DIAGNOSIS — K625 Hemorrhage of anus and rectum: Secondary | ICD-10-CM | POA: Diagnosis not present

## 2016-05-01 DIAGNOSIS — I6523 Occlusion and stenosis of bilateral carotid arteries: Secondary | ICD-10-CM

## 2016-05-01 DIAGNOSIS — D509 Iron deficiency anemia, unspecified: Secondary | ICD-10-CM

## 2016-05-01 NOTE — Progress Notes (Signed)
   Subjective:    Patient ID: Julie Swanson, female    DOB: 1928/02/09, 82 y.o.   MRN: [id number]  HPI Patient is here today because she has been diagnosed with bleeding hemorrhoids and she has to have surgery for this and her specialist recommends she follow up with her PCP first because she takes Eliquis.   This patient states over the past 6 weeks with a large amount of blood in the stools. She states over the past 6 weeks she is also had progressive left lower quadrant pain and discomfort with aching and discomfort that wakes her up at night as well as causes discomfort throughout the day diminished appetite. Relates some weakness.  Patient has no other concerns at this time.  She denies difficulty breathing chest tightness pressure pain numbness or tingling.   Review of Systems  Constitutional: Negative for activity change, fatigue and fever.  Respiratory: Negative for cough and shortness of breath.   Cardiovascular: Negative for chest pain and leg swelling.  Gastrointestinal: Positive for abdominal pain, anal bleeding and blood in stool.  Neurological: Negative for headaches.       Objective:   Physical Exam  Constitutional: She appears well-nourished. No distress.  Cardiovascular: Normal rate and normal heart sounds.   No murmur heard. Pulmonary/Chest: Effort normal and breath sounds normal. No respiratory distress.  Musculoskeletal: She exhibits no edema.  Lymphadenopathy:    She has no cervical adenopathy.  Neurological: She is alert. She exhibits normal muscle tone.  Psychiatric: Her behavior is normal.  Vitals reviewed.  Significant tenderness in the abdomen in the mid abdomen left mid quadrant and left lower quadrant. No guarding or rebound but patient certainly has severe tenderness.       Assessment & Plan:  Rectal bleeding for several months Left sided abdominal pain midabdominal pain and left lower quadrant abdominal pain progressive over the past 6  weeks Decreased appetite It would not be wise to attribute this to simply hemorrhoids at this point because of the severe pain and discomfort along with the tenderness in the abdomen along with significant rectal bleeding we need to do a CAT scan plus also referral. Will also be doing lab work. Patient aware of all this.

## 2016-05-01 NOTE — Telephone Encounter (Signed)
Patient said she was told she needed a CT and to go have this done in the morning at [facility].  Patients daughter, Julie Swanson, called [facility] and they do not have her in their system.  Please advise.

## 2016-05-01 NOTE — Telephone Encounter (Signed)
Her daughter Darel HongJudy is on her HIPAA release.

## 2016-05-01 NOTE — Telephone Encounter (Signed)
Spoke with daughter and informed that we call [facility] and they will contact her to have exam scheduled.

## 2016-05-02 LAB — CBC WITH DIFFERENTIAL/PLATELET
BASOS ABS: 0 10*3/uL (ref 0.0–0.2)
Basos: 0 %
EOS (ABSOLUTE): 0.1 10*3/uL (ref 0.0–0.4)
Eos: 2 %
Hematocrit: 36.3 % (ref 34.0–46.6)
Hemoglobin: 12.4 g/dL (ref 11.1–15.9)
IMMATURE GRANS (ABS): 0 10*3/uL (ref 0.0–0.1)
IMMATURE GRANULOCYTES: 0 %
LYMPHS: 35 %
Lymphocytes Absolute: 1.9 10*3/uL (ref 0.7–3.1)
MCH: 30.6 pg (ref 26.6–33.0)
MCHC: 34.2 g/dL (ref 31.5–35.7)
MCV: 90 fL (ref 79–97)
Monocytes Absolute: 0.6 10*3/uL (ref 0.1–0.9)
Monocytes: 10 %
NEUTROS PCT: 53 %
Neutrophils Absolute: 2.9 10*3/uL (ref 1.4–7.0)
PLATELETS: 238 10*3/uL (ref 150–379)
RBC: 4.05 x10E6/uL (ref 3.77–5.28)
RDW: 13.7 % (ref 12.3–15.4)
WBC: 5.4 10*3/uL (ref 3.4–10.8)

## 2016-05-02 LAB — IRON AND TIBC
IRON: 74 ug/dL (ref 27–139)
Iron Saturation: 18 % (ref 15–55)
Total Iron Binding Capacity: 404 ug/dL (ref 250–450)
UIBC: 330 ug/dL (ref 118–369)

## 2016-05-02 LAB — FERRITIN: FERRITIN: 57 ng/mL (ref 15–150)

## 2016-05-02 LAB — BASIC METABOLIC PANEL
BUN / CREAT RATIO: 14 (ref 12–28)
BUN: 16 mg/dL (ref 8–27)
CALCIUM: 9.6 mg/dL (ref 8.7–10.3)
CHLORIDE: 96 mmol/L (ref 96–106)
CO2: 28 mmol/L (ref 18–29)
Creatinine, Ser: 1.12 mg/dL — ABNORMAL HIGH (ref 0.57–1.00)
GFR calc Af Amer: 51 mL/min/{1.73_m2} — ABNORMAL LOW (ref >59)
GFR calc non Af Amer: 45 mL/min/{1.73_m2} — ABNORMAL LOW (ref >59)
GLUCOSE: 117 mg/dL — AB (ref 65–99)
Potassium: 3.8 mmol/L (ref 3.5–5.2)
Sodium: 139 mmol/L (ref 134–144)

## 2016-05-03 ENCOUNTER — Ambulatory Visit
Admission: RE | Admit: 2016-05-03 | Discharge: 2016-05-03 | Disposition: A | Discharge status: Home / Self Care | Payer: Medicare Other | Source: Ambulatory Visit | Attending: Family Medicine | Admitting: Family Medicine

## 2016-05-03 MED ORDER — IOPAMIDOL (ISOVUE-300) INJECTION 61%
100.0000 mL | Freq: Once | INTRAVENOUS | Status: AC | PRN
Start: 2016-05-03 — End: 2016-05-03
  Administered 2016-05-03: 100 mL via INTRAVENOUS

## 2016-05-05 ENCOUNTER — Encounter: Payer: Self-pay | Admitting: Family Medicine

## 2016-05-05 ENCOUNTER — Ambulatory Visit (INDEPENDENT_AMBULATORY_CARE_PROVIDER_SITE_OTHER): Payer: Medicare Other | Admitting: Family Medicine

## 2016-05-05 ENCOUNTER — Telehealth: Payer: Self-pay | Admitting: Family Medicine

## 2016-05-05 VITALS — BP 116/78 | Ht 64.0 in | Wt 152.4 lb

## 2016-05-05 DIAGNOSIS — I6523 Occlusion and stenosis of bilateral carotid arteries: Secondary | ICD-10-CM | POA: Diagnosis not present

## 2016-05-05 DIAGNOSIS — K862 Cyst of pancreas: Secondary | ICD-10-CM

## 2016-05-05 NOTE — Telephone Encounter (Addendum)
Pt called stating that the name of the study is transanal hemorrhoidal dearterialization [facility]).

## 2016-05-05 NOTE — Progress Notes (Signed)
   Subjective:    Patient ID: Julie Swanson, female    DOB: Oct 21, 1927, 82 y.o.   MRN: [id number]  HPI Patient is here today to discuss her recent CT scan results. Patient wants a copy of her ct scan and blood work reports.  Patient has no other concerns at this time.   Review of Systems     Objective:   Physical Exam   20 minutes spent discussing the results of the CAT scan answering her questions as well no examination was part of this exam. All of it was spent in discussion.      Assessment & Plan:   20 minutes spent with the patient discussing the results of her CAT scan including the cysts in the liver in the cysts in the pancreatic head the ones in the liver are the same as what they were in 2006 the one in the pancreas is new we will discuss this with her gastroenterologist to see if he feels that MRI is necessary. We will notify the patient of this finding.

## 2016-05-09 ENCOUNTER — Telehealth (INDEPENDENT_AMBULATORY_CARE_PROVIDER_SITE_OTHER): Payer: Self-pay | Admitting: Internal Medicine

## 2016-05-09 NOTE — Telephone Encounter (Signed)
Talk with Dr.Luking. He will arrange for MRCP and go from there.

## 2016-05-09 NOTE — Telephone Encounter (Signed)
Please see result note from CAT scan. Once the MRI is scheduled these make sure that the patient also has office visit with myself approximately 3 business days after the MRI to discuss the MRI.

## 2016-05-09 NOTE — Telephone Encounter (Signed)
Called the hospital and operator will have Dr. Karilyn Cotaehman paged and call back on your cell number. (Called his office this morning and no one answers the phone, left message on voicemail and no one has called back yet)

## 2016-05-09 NOTE — Telephone Encounter (Signed)
I had placed a call to Dr.Rehman last week. This is in regards to the patient's abnormal CAT scan. I was informed that he would not be back in till today. Please have him pay age and call my cell number [phone] thank you

## 2016-05-09 NOTE — Telephone Encounter (Signed)
[name] from Dr. Lorin PicketScott Luking's office left a message that Dr. Gerda DissLuking needs to speak to you regarding Julie NobleNancy Cagley, she stated it was imperative that he speaks with you.   Back line to Dr. Fanny DanceScott Luking's office - [phone]

## 2016-05-10 NOTE — Addendum Note (Signed)
Addended by: Margaretha SheffieldBROWN, AUTUMN S on: 05/10/2016 04:40 PM   Modules accepted: Orders

## 2016-05-10 NOTE — Telephone Encounter (Signed)
Discussed with patient. Patient advised Dr Lorin PicketScott spoke with Dr. Karilyn Cotaehman and although all of these findings could be perfectly benign or in other words noncancerous it is best to look into this further. He recommends liver profile, metabolic 7, lipase-also recommends MRI of the pancreas-pancreatic protocol-pancreatic cyst as the reason. He recommends once all of this is completed then he will do a follow-up with her. Please schedule the MRI. Have the patient do a follow-up office visit within 3 business days of the MRI( typically takes 2-3 days to get MRI results severe for 3business days after the MRI would be fine) Patient verbalized understanding and would like MRI at [facility] and would like them to contact her daughter to schedule. MRI order for [facility] placed in [facility]. They will contact daughter to schedule appt. Blood work ordered in [employer].

## 2016-05-10 NOTE — Addendum Note (Signed)
Addended by: Margaretha SheffieldBROWN, Takeira Yanes S on: 05/10/2016 04:45 PM   Modules accepted: Orders

## 2016-05-12 LAB — BASIC METABOLIC PANEL
BUN / CREAT RATIO: 16 (ref 12–28)
BUN: 16 mg/dL (ref 8–27)
CALCIUM: 9.4 mg/dL (ref 8.7–10.3)
CHLORIDE: 97 mmol/L (ref 96–106)
CO2: 24 mmol/L (ref 18–29)
CREATININE: 1.02 mg/dL — AB (ref 0.57–1.00)
GFR calc Af Amer: 58 mL/min/{1.73_m2} — ABNORMAL LOW (ref >59)
GFR calc non Af Amer: 50 mL/min/{1.73_m2} — ABNORMAL LOW (ref >59)
GLUCOSE: 153 mg/dL — AB (ref 65–99)
Potassium: 3.9 mmol/L (ref 3.5–5.2)
Sodium: 139 mmol/L (ref 134–144)

## 2016-05-12 LAB — HEPATIC FUNCTION PANEL
ALBUMIN: 4.3 g/dL (ref 3.5–4.7)
ALK PHOS: 69 IU/L (ref 39–117)
ALT: 12 IU/L (ref 0–32)
AST: 20 IU/L (ref 0–40)
BILIRUBIN TOTAL: 0.6 mg/dL (ref 0.0–1.2)
BILIRUBIN, DIRECT: 0.18 mg/dL (ref 0.00–0.40)
TOTAL PROTEIN: 6.5 g/dL (ref 6.0–8.5)

## 2016-05-12 LAB — LIPASE: LIPASE: 37 U/L (ref 0–59)

## 2016-05-16 ENCOUNTER — Telehealth (INDEPENDENT_AMBULATORY_CARE_PROVIDER_SITE_OTHER): Payer: Self-pay | Admitting: Internal Medicine

## 2016-05-16 NOTE — Telephone Encounter (Signed)
Patient called, stated that she is scheduled for a MRI on 05/23/16.  She stated that you and Dr. Gerda DissLuking have been talking regarding her condition.  She stated that she really wants to speak to you before she has the MRI done, as if they find cancer, she doesn't want to do anything about it.  She is concerned about putting herself though test and with her age, not wanting to put herself through all that.  [phone]

## 2016-05-16 NOTE — Telephone Encounter (Signed)
Please let me speak with patient thank you 

## 2016-05-16 NOTE — Telephone Encounter (Signed)
Patient's call returned. She states she is feeling fine and not having any abdominal pain and therefore would like to hold off MRI which was requested by Dr. Gerda DissLuking after discussion with me. Will ask Dr. Gerda DissLuking to cancel MRI if he agrees.

## 2016-05-17 NOTE — Telephone Encounter (Signed)
Long discussion held with patient please see lab result notes patient has opted to hold off on MRI. I agree with the patient regarding this. Also a message from Dr.Rehman was reviewed regarding this same issue.

## 2016-05-17 NOTE — Telephone Encounter (Signed)
Left message to return call 

## 2016-05-17 NOTE — Telephone Encounter (Signed)
Dr scott spoke with pt.  

## 2016-05-23 ENCOUNTER — Other Ambulatory Visit: Payer: Medicare Other

## 2016-05-23 ENCOUNTER — Institutional Professional Consult (permissible substitution): Payer: Medicare Other | Admitting: Cardiovascular Disease

## 2016-05-31 ENCOUNTER — Encounter: Payer: Self-pay | Admitting: Cardiology

## 2016-06-14 ENCOUNTER — Ambulatory Visit (INDEPENDENT_AMBULATORY_CARE_PROVIDER_SITE_OTHER): Payer: Medicare Other | Admitting: Cardiology

## 2016-06-14 VITALS — BP 126/72 | HR 99 | Ht 64.5 in | Wt 152.0 lb

## 2016-06-14 DIAGNOSIS — I481 Persistent atrial fibrillation: Secondary | ICD-10-CM

## 2016-06-14 DIAGNOSIS — I739 Peripheral vascular disease, unspecified: Secondary | ICD-10-CM

## 2016-06-14 DIAGNOSIS — E7800 Pure hypercholesterolemia, unspecified: Secondary | ICD-10-CM | POA: Diagnosis not present

## 2016-06-14 DIAGNOSIS — I6523 Occlusion and stenosis of bilateral carotid arteries: Secondary | ICD-10-CM | POA: Diagnosis not present

## 2016-06-14 DIAGNOSIS — I4819 Other persistent atrial fibrillation: Secondary | ICD-10-CM

## 2016-06-14 MED ORDER — METOPROLOL SUCCINATE ER 50 MG PO TB24: 50.0000 mg | ORAL_TABLET | Freq: Two times a day (BID) | ORAL | 1 refills | Status: DC

## 2016-06-14 NOTE — Patient Instructions (Addendum)
Medication Instructions:  Increase Toprol XL (metoprolol succinate) to 50 mg two times a day.  Labwork: None  Testing/Procedures: None   Follow-Up: Your physician wants you to follow-up in: 4 months with Dr Shirlee LatchMcLean in the [facility]. (February 2018).  You will receive a reminder letter in the mail two months in advance. If you don't receive a letter, please call our office to schedule the follow-up appointment.       If you need a refill on your cardiac medications before your next appointment, please call your pharmacy.

## 2016-06-15 ENCOUNTER — Encounter: Payer: Self-pay | Admitting: Cardiology

## 2016-06-15 NOTE — Progress Notes (Signed)
Patient ID: Julie SageNancy S Swanson, female   DOB: 07-17-1928, 82 y.o.   MRN: [id number] PCP: Dr. Lilyan PuntScott Luking  82 yo presents for followup of paroxysmal atrial fibrillation.  She had been having runs of rapid tachypalpitations when I saw her initially.  She was quite symptomatic with episodes.  Event monitor showed runs of atrial fibrillation with RVR.  I started her on Multaq for rhythm control given her significant symptoms and on apixaban.  She did not tolerate diltiazem due to ankle swelling and was switched over to Coreg for rate control.  She did not tolerate Coreg and is back on propranolol, which she does ok with.  Echo in 11/14 showed EF 60-65% with mild MR. No exertional chest pain or dyspnea. No lightheadedness.  She went into atrial fibrillation again in 8/16.  She had DCCV in 9/16 back to NSR and was continued on Multaq.  However, she went back into atrial fibrillation shortly after on despite Multaq use.  Multaq was stopped.  Given ongoing fatigue with atrial fibrillation, she was started on amiodarone.  DCCV was done in 12/16, she went back into NSR.  Propranolol was stopped due to bradycardia.  Unfortunately, atrial fibrillation has recurred.  She saw [name] and it was decided to try to reload her on amiodarone and attempt DCCV one more time.  Later in 12/16, I cardioverted her again while on amiodarone.  However, at 09/10/15 appt in atrial fibrillation clinic, she was back in atrial fibrillation.  She developed a tremor on amiodarone.  Amiodarone was stopped with plan to rate control/anticoagulate going forward.  Propranolol was restarted .  Currently doing ok.  Remains in atrial fibrillation.  HR still runs > 100 bpm typically at home.  She tolerated Toprol XL without nightmares (as she had had with beta blockers in the past).  Decreased edema.  She is walking 1/2 mile most mornings without exertional dyspnea or chest pain.  No lightheadedness.  No claudication though legs feel weak when she walks  up an incline. Weight is stable. She still has periodic hemorrhoidal bleeding but not recently.   ECG: Atrial fibrillation, septal Qs, lateral TWIs.   Labs (10/14): LDL 90, HDL 41, K 4.1, creatinine 0.9 Labs (4/15): LDL 74, HDL 40, K 4.1, creatinine 4.090.87 Labs (10/15): LDL 73, HDL 38 Labs (12/15): K 4, creatinine 0.9, HCT 36 Labs (8/16): K 4, creatinine 0.97, HCT 37.8, TSH normal Labs (9/16): K 4.6, creatinine 1.06, LDL 68, HDL 47 Labs (11/16): TSH normal, HCT 34.1, LFTs normal Labs (12/16): creatinine 0.96, HCT 35.9 Labs (5/17): K 4.1, creatinine 0.99, LDL 77, HDL 38  PMH: 1. LHC (8/10) with minimal nonobstructive disease.  ETT (10/13) with 2'53" exercise, nonspecific ECG changes with no evidence for ischemia.  2. Mitral valve prolapse: Echo (11/14) with EF 60-65%, mild MR.  3. Atrial fibrillation:  Event monitor (10/13) with PACs only.  Event monitor (11/14) showed atrial fibrillation with RVR. She does not tolerate metoprolol (nightmares).  She had ankle swelling with diltiazem. Possible fatigue/diarrhea with Coreg. Holter (8/16) with persistent atrial fibrillation.  DCCV to NSR in 9/16.  Back in atrial fibrillation by 10/16 despite Multaq. DCCV to NSR x 2 in 12/16 on amiodarone, but atrial fibrillation recurred.  Amiodarone stopped. 4. HTN 5. Hyperlipidemia 6. Hysterectomy. 7. Carotid disease: Carotid dopplers (1/15) with prominent plaque, mild stenosis.  8. Internal hemorrhoids with episodic rectal bleeding.  9. Macular degeneration.  10. PFTs: Mild obstructive lung disease.  11. PAD: ABIs (2/17) were  normal, but there was evidence for "developing fem-pop disease."  12. Pancreatic cysts on CT.   SH: Widow, lives in [place], nonsmoker.  2 daughters.   FH: Mother with CVA, CAD.  Father with CAD.   ROS: All systems reviewed and negative except as per HPI.   Current Outpatient Prescriptions  Medication Sig Dispense Refill  . acetaminophen (TYLENOL) 500 MG tablet Take 500 mg by  mouth 2 (two) times daily as needed for moderate pain.    Marland Kitchen alendronate (FOSAMAX) 70 MG tablet Take 1 tablet (70 mg total) by mouth every 7 (seven) days. Take with a full glass of water on an empty stomach. 12 tablet 1  . apixaban (ELIQUIS) 5 MG TABS tablet Take 1 tablet (5 mg total) by mouth 2 (two) times daily. 180 tablet 3  . atorvastatin (LIPITOR) 10 MG tablet Take 1 tablet (10 mg total) by mouth daily. 90 tablet 3  . cycloSPORINE (RESTASIS) 0.05 % ophthalmic emulsion Place 1 drop into both eyes daily.     Marland Kitchen diltiazem (CARDIZEM CD) 120 MG 24 hr capsule Take 1 capsule (120 mg total) by mouth daily. 90 capsule 3  . hydrochlorothiazide (HYDRODIURIL) 25 MG tablet Take 1 tablet (25 mg total) by mouth daily. 90 tablet 3  . hydrocortisone-pramoxine (PROCTOFOAM-HC) rectal foam Place 1 applicator rectally 2 (two) times daily as needed for hemorrhoids or itching.    . Lutein 20 MG CAPS Take 1 capsule by mouth daily.     . metoprolol succinate (TOPROL-XL) 50 MG 24 hr tablet Take 1 tablet (50 mg total) by mouth 2 (two) times daily. Take with or immediately following a meal. 180 tablet 1  . Multiple Vitamins-Minerals (ICAPS) CAPS Take 2 capsules by mouth daily.    Marland Kitchen OVER THE COUNTER MEDICATION Med Name: BENE-FIBER Take two (2) teaspoons by mouth daily with 8 oz water.    . triamcinolone cream (KENALOG) 0.1 % Apply 1 application topically 2 (two) times daily as needed (neck).     No current facility-administered medications for this visit.     BP 126/72   Pulse 99   Ht 5' 4.5" (1.638 m)   Wt 152 lb (68.9 kg)   BMI 25.69 kg/m  General: NAD Neck: No JVD, no thyromegaly or thyroid nodule.  Lungs: Clear to auscultation bilaterally with normal respiratory effort. CV: Nondisplaced PMI.  Heart irregular S1/S2, no S3/S4, no murmur.  1+ ankle edema. Right carotid bruit. Normal pedal pulses.  Abdomen: Soft, nontender, no hepatosplenomegaly, no distention.  Skin: Intact without lesions or rashes.   Neurologic: Alert and oriented x 3.  Psych: Normal affect. Extremities: No clubbing or cyanosis.   Assessment/Plan: 1. Atrial fibrillation:  Persistent, has recurred after DCCV in 12/16 on amiodarone.  She has failed amiodarone and Multaq.  She is not a good sotalol or Tikosyn candidate as QTc has been prolonged.  We have decided on following a rate control and anticoagulation strategy. - HR remains high.  Increase Toprol XL to 50 mg bid.  - Continue diltiazem CD for now, she can tolerate the ankle swelling which is fairly mild.  I recommended that she wear compression stockings.    - Continue Eliquis.  - Would recommend that she go ahead and get hemorrhoids banded with occasional hemorrhoidal bleeding on Eliquis.  2. HTN:  BP ok today.    3. Hyperlipidemia: Good lipids in 5/17. Goal LDL < 70 with PAD.   4. Carotid bruit: Mild stenosis only.  5. PAD: ABIs normal  but evidence for developing fem-pop disease.  She continues to have leg fatigue but says she can walk up to 1/2 mile.   Followup in [facility] in [facility].   Marca Ancona 06/15/2016

## 2016-06-29 ENCOUNTER — Ambulatory Visit (INDEPENDENT_AMBULATORY_CARE_PROVIDER_SITE_OTHER): Payer: Medicare Other | Admitting: Family Medicine

## 2016-06-29 ENCOUNTER — Encounter: Payer: Self-pay | Admitting: Family Medicine

## 2016-06-29 VITALS — BP 124/80 | Ht 64.0 in | Wt 152.5 lb

## 2016-06-29 DIAGNOSIS — I10 Essential (primary) hypertension: Secondary | ICD-10-CM

## 2016-06-29 DIAGNOSIS — K648 Other hemorrhoids: Secondary | ICD-10-CM

## 2016-06-29 DIAGNOSIS — M549 Dorsalgia, unspecified: Secondary | ICD-10-CM | POA: Diagnosis not present

## 2016-06-29 DIAGNOSIS — I6523 Occlusion and stenosis of bilateral carotid arteries: Secondary | ICD-10-CM

## 2016-06-29 DIAGNOSIS — I481 Persistent atrial fibrillation: Secondary | ICD-10-CM

## 2016-06-29 DIAGNOSIS — I4819 Other persistent atrial fibrillation: Secondary | ICD-10-CM

## 2016-06-29 NOTE — Progress Notes (Signed)
   Subjective:    Patient ID: Julie Swanson, female    DOB: [birth year], 82 y.o.   MRN: [id number]  Hypertension  This is a chronic problem. The current episode started more than 1 year ago. The problem has been gradually improving since onset. Pertinent negatives include no chest pain, headaches or shortness of breath. There are no associated agents to hypertension. There are no known risk factors for coronary artery disease. Treatments tried: metoprolol. The current treatment provides moderate improvement. There are no compliance problems.    Patient states that she has no new concerns at this time.  Patient does relate mid back pain and discomfort. Does not radiate down the legs. Hurts with certain movements. Has noticed it for 6 months. Does not wake her up at night. No fever sweats or weight loss associated with this She does have intermittent hemorrhoid issues and has blood in her stool because of this she is thinking about getting a surgical procedure done to help clarify and take care of this problem She has persistent atrial fibrillation her heart rate medication recently adjusted to keep her heart rate under 100 she does take Eliquis on a regular basis and denies any bleeding issues other than the blood in her bowel movements. Her gastroenterologist feels that this is related to her hemorrhoids.  Review of Systems  Constitutional: Negative for activity change, fatigue and fever.  Respiratory: Negative for cough and shortness of breath.   Cardiovascular: Negative for chest pain and leg swelling.  Gastrointestinal: Positive for blood in stool.  Neurological: Negative for headaches.       Objective:   Physical Exam  Constitutional: She appears well-nourished. No distress.  Cardiovascular: Normal rate, regular rhythm and normal heart sounds.   No murmur heard. Pulmonary/Chest: Effort normal and breath sounds normal. No respiratory distress.  Musculoskeletal: She exhibits no edema.    Lymphadenopathy:    She has no cervical adenopathy.  Neurological: She is alert. She exhibits normal muscle tone.  Psychiatric: Her behavior is normal.  Vitals reviewed.         Assessment & Plan:  Hypertension good control currently continue current medication Persistent atrial fibrillation continue Eliquis Heart rate right at 100 we will not increase medication currently Hemorrhoid issues I encouraged her to follow-up with surgeon to get the surgical procedure done it seems very straightforward that they would do as a day surgery without having to put her under general anesthesia Mid back pain probable arthralgias related age but we will get x-rays of the back Tylenol may be used.  Follow-up in a proximally 4 months

## 2016-06-30 ENCOUNTER — Ambulatory Visit (HOSPITAL_COMMUNITY)
Admission: RE | Admit: 2016-06-30 | Discharge: 2016-06-30 | Disposition: A | Discharge status: Home / Self Care | Payer: Medicare Other | Source: Ambulatory Visit | Attending: Family Medicine | Admitting: Family Medicine

## 2016-06-30 ENCOUNTER — Telehealth: Payer: Self-pay | Admitting: *Deleted

## 2016-06-30 ENCOUNTER — Encounter: Payer: Self-pay | Admitting: Family Medicine

## 2016-06-30 DIAGNOSIS — M47894 Other spondylosis, thoracic region: Secondary | ICD-10-CM | POA: Insufficient documentation

## 2016-06-30 DIAGNOSIS — I700 Atherosclerosis of aorta: Secondary | ICD-10-CM | POA: Insufficient documentation

## 2016-06-30 DIAGNOSIS — I4819 Other persistent atrial fibrillation: Secondary | ICD-10-CM

## 2016-06-30 DIAGNOSIS — M4184 Other forms of scoliosis, thoracic region: Secondary | ICD-10-CM | POA: Diagnosis not present

## 2016-06-30 DIAGNOSIS — M47892 Other spondylosis, cervical region: Secondary | ICD-10-CM | POA: Diagnosis not present

## 2016-06-30 DIAGNOSIS — M549 Dorsalgia, unspecified: Secondary | ICD-10-CM | POA: Insufficient documentation

## 2016-06-30 NOTE — Telephone Encounter (Signed)
Copied from 06/14/16 office visit with Dr Shirlee LatchMcLean:  Atrial fibrillation:  Persistent, has recurred after DCCV in 12/16 on amiodarone.  She has failed amiodarone and Multaq.  She is not a good sotalol or Tikosyn candidate as QTc has been prolonged.  We have decided on following a rate control and anticoagulation strategy. - HR remains high.  Increase Toprol XL to 50 mg bid.   06/30/16--LMTCB for pt to get HR and BP since increasing Toprol XL to 50mg  bid.

## 2016-06-30 NOTE — Progress Notes (Signed)
Tried calling multiple times number busy

## 2016-07-04 ENCOUNTER — Ambulatory Visit: Payer: Medicare Other

## 2016-07-04 ENCOUNTER — Telehealth: Payer: Self-pay | Admitting: Cardiology

## 2016-07-04 NOTE — Telephone Encounter (Signed)
Pt experiencing hemorrhoidal bleeding for last 5 days.  Requesting to stop Eliquis. Reviewed w/ DOD, Dr. Elease HashimotoNahser - advised patient ok to hold NOAC for 3-4 days.  Pt understands to call office if she needs to hold for longer.  She thanks us for calling back and is agreeable to plan.

## 2016-07-04 NOTE — Telephone Encounter (Signed)
New Message  Pt call requesting to speak with RN about update on med change. Pt states she was doing okay with the med change. Pt states she has been experiencing bleeding from hymenoids for the last five days. Pt would like to know if she could stop taking Eliquis for a day or two. Please call back to discuss.

## 2016-07-10 ENCOUNTER — Ambulatory Visit: Payer: Medicare Other

## 2016-07-11 NOTE — Telephone Encounter (Signed)
Pt states since increasing Toprol XL to 50mg  bid her heart rate has been averaging about 110, less than 100 only about 3 times. Pt states her BP has been averaging 112/60.  Pt states she had been off Eliquis for 3 days last week because of bleeding from hemorrhoids, she restarted Eliquis last Friday, and had bleeding again this morning.  Pt states Dr Maisie Fushomas, GI  had recommended out pt procedure ,different than banding, to treat hemorrhoids [facility]. Pt is considering having this done and would like Dr Alford HighlandMcLean's opinion about having this done.  I will forward to Dr Shirlee LatchMcLean for review.

## 2016-07-11 NOTE — Telephone Encounter (Signed)
LMTCB

## 2016-07-11 NOTE — Telephone Encounter (Signed)
I would recommend that she go ahead with the procedure to treat her hemorrhoids to avoid further Eliquis stoppages.  Would increase Toprol XL to 75 mg bid.

## 2016-07-13 ENCOUNTER — Ambulatory Visit (INDEPENDENT_AMBULATORY_CARE_PROVIDER_SITE_OTHER): Payer: Medicare Other | Admitting: *Deleted

## 2016-07-13 DIAGNOSIS — Z23 Encounter for immunization: Secondary | ICD-10-CM

## 2016-07-13 MED ORDER — METOPROLOL SUCCINATE ER 50 MG PO TB24: ORAL_TABLET | ORAL | 3 refills | Status: DC

## 2016-07-13 NOTE — Telephone Encounter (Signed)
I spoke with patient. I advised about hemorrhoid procedure and medication change. She voiced understanding of medication instructions and GI recommendations. I sent rx for new SIG to [facility] in [place] at patient's request.

## 2016-07-20 ENCOUNTER — Other Ambulatory Visit: Payer: Self-pay | Admitting: Family Medicine

## 2016-07-20 DIAGNOSIS — Z1231 Encounter for screening mammogram for malignant neoplasm of breast: Secondary | ICD-10-CM

## 2016-08-21 ENCOUNTER — Ambulatory Visit: Payer: Medicare Other

## 2016-08-21 ENCOUNTER — Other Ambulatory Visit: Payer: Self-pay | Admitting: Cardiology

## 2016-09-20 ENCOUNTER — Ambulatory Visit: Payer: Medicare Other

## 2016-10-11 ENCOUNTER — Ambulatory Visit
Admission: RE | Admit: 2016-10-11 | Discharge: 2016-10-11 | Disposition: A | Discharge status: Home / Self Care | Payer: Medicare Other | Source: Ambulatory Visit | Attending: Family Medicine | Admitting: Family Medicine

## 2016-10-11 DIAGNOSIS — Z1231 Encounter for screening mammogram for malignant neoplasm of breast: Secondary | ICD-10-CM

## 2016-10-12 ENCOUNTER — Other Ambulatory Visit: Payer: Self-pay | Admitting: Family Medicine

## 2016-10-12 DIAGNOSIS — R928 Other abnormal and inconclusive findings on diagnostic imaging of breast: Secondary | ICD-10-CM

## 2016-10-13 ENCOUNTER — Encounter (HOSPITAL_COMMUNITY): Payer: Self-pay

## 2016-10-13 ENCOUNTER — Ambulatory Visit (HOSPITAL_COMMUNITY)
Admission: RE | Admit: 2016-10-13 | Discharge: 2016-10-13 | Disposition: A | Discharge status: Home / Self Care | Payer: Medicare Other | Source: Ambulatory Visit | Attending: Cardiology | Admitting: Cardiology

## 2016-10-13 VITALS — BP 146/78 | HR 98 | Wt 153.5 lb

## 2016-10-13 DIAGNOSIS — I480 Paroxysmal atrial fibrillation: Secondary | ICD-10-CM | POA: Diagnosis present

## 2016-10-13 DIAGNOSIS — I481 Persistent atrial fibrillation: Secondary | ICD-10-CM | POA: Diagnosis not present

## 2016-10-13 DIAGNOSIS — I739 Peripheral vascular disease, unspecified: Secondary | ICD-10-CM | POA: Insufficient documentation

## 2016-10-13 DIAGNOSIS — I4891 Unspecified atrial fibrillation: Secondary | ICD-10-CM

## 2016-10-13 DIAGNOSIS — E785 Hyperlipidemia, unspecified: Secondary | ICD-10-CM | POA: Insufficient documentation

## 2016-10-13 DIAGNOSIS — I5032 Chronic diastolic (congestive) heart failure: Secondary | ICD-10-CM | POA: Diagnosis not present

## 2016-10-13 DIAGNOSIS — R0989 Other specified symptoms and signs involving the circulatory and respiratory systems: Secondary | ICD-10-CM | POA: Diagnosis not present

## 2016-10-13 DIAGNOSIS — I110 Hypertensive heart disease with heart failure: Secondary | ICD-10-CM | POA: Diagnosis not present

## 2016-10-13 DIAGNOSIS — Z7901 Long term (current) use of anticoagulants: Secondary | ICD-10-CM | POA: Diagnosis not present

## 2016-10-13 LAB — BRAIN NATRIURETIC PEPTIDE: B NATRIURETIC PEPTIDE 5: 296.7 pg/mL — AB (ref 0.0–100.0)

## 2016-10-13 LAB — BASIC METABOLIC PANEL
Anion gap: 12 (ref 5–15)
BUN: 13 mg/dL (ref 6–20)
CHLORIDE: 98 mmol/L — AB (ref 101–111)
CO2: 26 mmol/L (ref 22–32)
CREATININE: 1.05 mg/dL — AB (ref 0.44–1.00)
Calcium: 9.5 mg/dL (ref 8.9–10.3)
GFR calc Af Amer: 54 mL/min — ABNORMAL LOW (ref >60)
GFR calc non Af Amer: 47 mL/min — ABNORMAL LOW (ref >60)
GLUCOSE: 88 mg/dL (ref 65–99)
Potassium: 4 mmol/L (ref 3.5–5.1)
SODIUM: 136 mmol/L (ref 135–145)

## 2016-10-13 LAB — CBC
HEMATOCRIT: 36.5 % (ref 36.0–46.0)
Hemoglobin: 12.2 g/dL (ref 12.0–15.0)
MCH: 30 pg (ref 26.0–34.0)
MCHC: 33.4 g/dL (ref 30.0–36.0)
MCV: 89.7 fL (ref 78.0–100.0)
Platelets: 234 10*3/uL (ref 150–400)
RBC: 4.07 MIL/uL (ref 3.87–5.11)
RDW: 13.2 % (ref 11.5–15.5)
WBC: 6.7 10*3/uL (ref 4.0–10.5)

## 2016-10-13 MED ORDER — FUROSEMIDE 40 MG PO TABS: 40.0000 mg | ORAL_TABLET | Freq: Every day | ORAL | 6 refills | Status: DC

## 2016-10-13 MED ORDER — APIXABAN 5 MG PO TABS
5.0000 mg | ORAL_TABLET | Freq: Two times a day (BID) | ORAL | 12 refills | Status: DC
Start: 2016-10-13 — End: 2017-02-08

## 2016-10-13 MED ORDER — METOPROLOL SUCCINATE ER 50 MG PO TB24: 50.0000 mg | ORAL_TABLET | Freq: Two times a day (BID) | ORAL | 6 refills | Status: DC

## 2016-10-13 MED ORDER — DILTIAZEM HCL ER COATED BEADS 240 MG PO CP24: 240.0000 mg | ORAL_CAPSULE | Freq: Every day | ORAL | 3 refills | Status: DC

## 2016-10-13 MED ORDER — ATORVASTATIN CALCIUM 10 MG PO TABS: 10.0000 mg | ORAL_TABLET | Freq: Every day | ORAL | 12 refills | Status: DC

## 2016-10-13 MED ORDER — POTASSIUM CHLORIDE CRYS ER 20 MEQ PO TBCR: 20.0000 meq | EXTENDED_RELEASE_TABLET | Freq: Every day | ORAL | 3 refills | Status: DC

## 2016-10-13 MED ORDER — POTASSIUM CHLORIDE CRYS ER 20 MEQ PO TBCR: 20.0000 meq | EXTENDED_RELEASE_TABLET | Freq: Every day | ORAL | 6 refills | Status: DC

## 2016-10-13 MED ORDER — FUROSEMIDE 40 MG PO TABS
40.0000 mg | ORAL_TABLET | Freq: Every day | ORAL | 3 refills | Status: DC
Start: 2016-10-13 — End: 2016-10-13

## 2016-10-13 MED ORDER — METOPROLOL SUCCINATE ER 50 MG PO TB24: 50.0000 mg | ORAL_TABLET | Freq: Two times a day (BID) | ORAL | 3 refills | Status: DC

## 2016-10-13 MED ORDER — DILTIAZEM HCL ER COATED BEADS 240 MG PO CP24: 240.0000 mg | ORAL_CAPSULE | Freq: Every day | ORAL | 6 refills | Status: DC

## 2016-10-13 NOTE — Patient Instructions (Signed)
Stop HCTZ  Start Furosemide 40 mg daily  Start Potassium (k-dur) 20 meq daily  Decrease Metoprolol to 50 mg Twice daily   Increase Diltiazem to 240 mg daily  Labs today  Labs again in 2 weeks at [facility]  Your physician recommends that you schedule a follow-up appointment in: 1 month

## 2016-10-15 NOTE — Progress Notes (Signed)
Patient ID: Julie Swanson, female   DOB: 06/23/1928, 83 y.o.   MRN: [id number] PCP: Dr. Lilyan Punt  83 yo presents for followup of paroxysmal atrial fibrillation.  She had been having runs of rapid tachypalpitations when I saw her initially.  She was quite symptomatic with episodes.  Event monitor showed runs of atrial fibrillation with RVR.  I started her on Multaq for rhythm control given her significant symptoms and on apixaban.  She did not tolerate diltiazem due to ankle swelling and was switched over to Coreg for rate control.  She did not tolerate Coreg and is back on propranolol, which she does ok with.  Echo in 11/14 showed EF 60-65% with mild MR. No exertional chest pain or dyspnea. No lightheadedness.  She went into atrial fibrillation again in 8/16.  She had DCCV in 9/16 back to NSR and was continued on Multaq.  However, she went back into atrial fibrillation shortly after on despite Multaq use.  Multaq was stopped.  Given ongoing fatigue with atrial fibrillation, she was started on amiodarone.  DCCV was done in 12/16, she went back into NSR.  Propranolol was stopped due to bradycardia.  Unfortunately, atrial fibrillation has recurred.  She saw Tereso Newcomer and it was decided to try to reload her on amiodarone and attempt DCCV one more time.  Later in 12/16, I cardioverted her again while on amiodarone.  However, at 09/10/15 appt in atrial fibrillation clinic, she was back in atrial fibrillation.  She developed a tremor on amiodarone.  Amiodarone was stopped with plan to rate control/anticoagulate going forward.  She is now on Toprol XL and diltiazem CD.  Remains in atrial fibrillation.  HR better controlled but still goes > 100 bpm at times.  She continues to have occasional hemorrhoidal bleeding though this seems to have become less frequent.  Since increasing Toprol XL to 50 qam/75 qpm, she has noted increased fatigue/weakness.  She gets tired very easily.  Legs "wear out."  Does not feel like  doing much.  Not really short of breath, per se.  No chest pain.  No lightheadedness/syncope.  Weight stable.   Labs (10/14): LDL 90, HDL 41, K 4.1, creatinine 0.9 Labs (4/15): LDL 74, HDL 40, K 4.1, creatinine 4.09 Labs (10/15): LDL 73, HDL 38 Labs (12/15): K 4, creatinine 0.9, HCT 36 Labs (8/16): K 4, creatinine 0.97, HCT 37.8, TSH normal Labs (9/16): K 4.6, creatinine 1.06, LDL 68, HDL 47 Labs (11/16): TSH normal, HCT 34.1, LFTs normal Labs (12/16): creatinine 0.96, HCT 35.9 Labs (5/17): K 4.1, creatinine 0.99, LDL 77, HDL 38  PMH: 1. LHC (8/10) with minimal nonobstructive disease.  ETT (10/13) with 2'53" exercise, nonspecific ECG changes with no evidence for ischemia.  2. Mitral valve prolapse: Echo (11/14) with EF 60-65%, mild MR.  3. Atrial fibrillation:  Event monitor (10/13) with PACs only.  Event monitor (11/14) showed atrial fibrillation with RVR. She does not tolerate metoprolol (nightmares).  She had ankle swelling with diltiazem. Possible fatigue/diarrhea with Coreg. Holter (8/16) with persistent atrial fibrillation.  DCCV to NSR in 9/16.  Back in atrial fibrillation by 10/16 despite Multaq. DCCV to NSR x 2 in 12/16 on amiodarone, but atrial fibrillation recurred.  Amiodarone stopped. 4. HTN 5. Hyperlipidemia 6. Hysterectomy. 7. Carotid disease: Carotid dopplers (1/15) with prominent plaque, mild stenosis.  8. Internal hemorrhoids with episodic rectal bleeding.  9. Macular degeneration.  10. PFTs: Mild obstructive lung disease.  11. PAD: ABIs (2/17) were normal, but there  was evidence for "developing fem-pop disease."  12. Pancreatic cysts on CT.   SH: Widow, [place] in [place], nonsmoker.  2 daughters.   FH: Mother with CVA, CAD.  Father with CAD.   ROS: All systems reviewed and negative except as per HPI.   Current Outpatient Prescriptions  Medication Sig Dispense Refill  . acetaminophen (TYLENOL) 500 MG tablet Take 500 mg by mouth 2 (two) times daily as needed for  moderate pain.    Marland Kitchen. apixaban (ELIQUIS) 5 MG TABS tablet Take 1 tablet (5 mg total) by mouth 2 (two) times daily. 60 tablet 12  . atorvastatin (LIPITOR) 10 MG tablet Take 1 tablet (10 mg total) by mouth daily. 30 tablet 12  . cycloSPORINE (RESTASIS) 0.05 % ophthalmic emulsion Place 1 drop into both eyes daily.     Marland Kitchen. diltiazem (CARDIZEM CD) 240 MG 24 hr capsule Take 1 capsule (240 mg total) by mouth daily. 30 capsule 6  . hydrocortisone-pramoxine (PROCTOFOAM-HC) rectal foam Place 1 applicator rectally 2 (two) times daily as needed for hemorrhoids or itching.    . Lutein 20 MG CAPS Take 1 capsule by mouth daily.     . metoprolol succinate (TOPROL-XL) 50 MG 24 hr tablet Take 1 tablet (50 mg total) by mouth 2 (two) times daily. 60 tablet 6  . Multiple Vitamins-Minerals (ICAPS) CAPS Take 2 capsules by mouth daily.    Marland Kitchen. OVER THE COUNTER MEDICATION Med Name: BENE-FIBER Take two (2) teaspoons by mouth daily with 8 oz water.    . triamcinolone cream (KENALOG) 0.1 % Apply 1 application topically 2 (two) times daily as needed (neck).    . furosemide (LASIX) 40 MG tablet Take 1 tablet (40 mg total) by mouth daily. 30 tablet 6  . potassium chloride SA (K-DUR,KLOR-CON) 20 MEQ tablet Take 1 tablet (20 mEq total) by mouth daily. 30 tablet 6   No current facility-administered medications for this encounter.     BP (!) 146/78   Pulse 98   Wt 153 lb 8 oz (69.6 kg)   SpO2 100%   BMI 26.35 kg/m  General: NAD Neck: JVP 8 cm with HJR, no thyromegaly or thyroid nodule.  Lungs: Clear to auscultation bilaterally with normal respiratory effort. CV: Nondisplaced PMI.  Heart irregular S1/S2, no S3/S4, no murmur.  1+ edema to knees bilaterally. Right carotid bruit. Difficult to palpate pedal pulses.  Abdomen: Soft, nontender, no hepatosplenomegaly, no distention.  Skin: Intact without lesions or rashes.  Neurologic: Alert and oriented x 3.  Psych: Normal affect. Extremities: No clubbing or cyanosis.    Assessment/Plan: 1. Atrial fibrillation:  Persistent, has recurred after DCCV in 12/16 on amiodarone.  She has failed amiodarone and Multaq.  She is not a good sotalol or Tikosyn candidate as QTc has been prolonged.  We have decided on following a rate control and anticoagulation strategy. She has been fatigued/tired since increasing Toprol XL.  - Decrease Toprol XL back to 50 mg bid and increase diltiazem CD to 240 mg daily.    - Continue Eliquis.  2. HTN:  BP mildly elevated today, changing meds as above.   3. Hyperlipidemia: Good lipids in 5/17. Goal LDL < 70 with PAD.   4. Carotid bruit: Mild stenosis only.  5. PAD: ABIs normal in past but evidence for developing fem-pop disease.  Given leg fatigue, will repeat peripheral arterial doppler study.  6. Chronic diastolic CHF: She does appear to have mild volume overload on exam.  This may be contributing to her  fatigue/exercise intolerance.   - Stop HCTZ, start Lasix 40 mg daily with KCl 20 daily.  - BMET/BNP today, repeat BMET in 2 wks.   Followup in 1 month.   Marca Ancona 10/15/2016

## 2016-10-17 ENCOUNTER — Other Ambulatory Visit: Payer: Medicare Other

## 2016-10-20 ENCOUNTER — Ambulatory Visit
Admission: RE | Admit: 2016-10-20 | Discharge: 2016-10-20 | Disposition: A | Discharge status: Home / Self Care | Payer: Medicare Other | Source: Ambulatory Visit | Attending: Family Medicine | Admitting: Family Medicine

## 2016-10-20 DIAGNOSIS — R928 Other abnormal and inconclusive findings on diagnostic imaging of breast: Secondary | ICD-10-CM

## 2016-10-27 ENCOUNTER — Ambulatory Visit (INDEPENDENT_AMBULATORY_CARE_PROVIDER_SITE_OTHER): Payer: Medicare Other | Admitting: Family Medicine

## 2016-10-27 ENCOUNTER — Encounter: Payer: Self-pay | Admitting: Family Medicine

## 2016-10-27 VITALS — BP 118/70 | Ht 64.0 in | Wt 153.0 lb

## 2016-10-27 DIAGNOSIS — I4819 Other persistent atrial fibrillation: Secondary | ICD-10-CM

## 2016-10-27 DIAGNOSIS — I481 Persistent atrial fibrillation: Secondary | ICD-10-CM

## 2016-10-27 DIAGNOSIS — I10 Essential (primary) hypertension: Secondary | ICD-10-CM

## 2016-10-27 NOTE — Progress Notes (Signed)
   Subjective:    Patient ID: Julie Swanson, female    DOB: 1928/01/26, 83 y.o.   MRN: [id number]  Hypertension  This is a chronic problem. The current episode started more than 1 year ago. There are no associated agents to hypertension. There are no known risk factors for coronary artery disease. Treatments tried: metoprolol. The current treatment provides moderate improvement. There are no compliance problems.    Patient states that she has no concerns at this time.  See below we did have discussion regarding mammograms that she recently had We also had discussion regarding cardiology testing that is been done recently  Review of Systems    patient denies any chest tightness pressure pain shortness breath nausea vomiting diarrhea Objective:   Physical Exam Heart irregular rate controlled pulse normal BP good extremities no edema is set for some trace edema in the ankles Patient requested the results of her mammogram she also requested a printed copy we talked about her mammogram and printed copy recommended to repeat it again in 6 months time    Assessment & Plan:  HTN good control Atrial fib on multiple medicines doing well Follow-up in approximate 5-6 months sooner if any problems

## 2016-11-02 ENCOUNTER — Other Ambulatory Visit (HOSPITAL_COMMUNITY): Payer: Self-pay | Admitting: Cardiology

## 2016-11-02 DIAGNOSIS — I739 Peripheral vascular disease, unspecified: Secondary | ICD-10-CM

## 2016-11-03 ENCOUNTER — Other Ambulatory Visit (HOSPITAL_COMMUNITY): Payer: Self-pay | Admitting: Cardiology

## 2016-11-03 ENCOUNTER — Ambulatory Visit (HOSPITAL_COMMUNITY)
Admission: RE | Admit: 2016-11-03 | Discharge: 2016-11-03 | Disposition: A | Discharge status: Home / Self Care | Payer: Medicare Other | Source: Ambulatory Visit | Attending: Cardiology | Admitting: Cardiology

## 2016-11-03 DIAGNOSIS — E785 Hyperlipidemia, unspecified: Secondary | ICD-10-CM | POA: Diagnosis not present

## 2016-11-03 DIAGNOSIS — I739 Peripheral vascular disease, unspecified: Secondary | ICD-10-CM | POA: Insufficient documentation

## 2016-11-03 DIAGNOSIS — I10 Essential (primary) hypertension: Secondary | ICD-10-CM | POA: Diagnosis not present

## 2016-11-10 ENCOUNTER — Encounter (HOSPITAL_COMMUNITY): Payer: Self-pay

## 2016-11-10 ENCOUNTER — Ambulatory Visit (HOSPITAL_COMMUNITY)
Admission: RE | Admit: 2016-11-10 | Discharge: 2016-11-10 | Disposition: A | Discharge status: Home / Self Care | Payer: Medicare Other | Source: Ambulatory Visit | Attending: Cardiology | Admitting: Cardiology

## 2016-11-10 VITALS — BP 152/87 | HR 88 | Wt 157.2 lb

## 2016-11-10 DIAGNOSIS — I5032 Chronic diastolic (congestive) heart failure: Secondary | ICD-10-CM | POA: Diagnosis not present

## 2016-11-10 DIAGNOSIS — I4891 Unspecified atrial fibrillation: Secondary | ICD-10-CM

## 2016-11-10 DIAGNOSIS — I481 Persistent atrial fibrillation: Secondary | ICD-10-CM | POA: Diagnosis not present

## 2016-11-10 DIAGNOSIS — Z7901 Long term (current) use of anticoagulants: Secondary | ICD-10-CM | POA: Diagnosis not present

## 2016-11-10 DIAGNOSIS — E785 Hyperlipidemia, unspecified: Secondary | ICD-10-CM | POA: Insufficient documentation

## 2016-11-10 DIAGNOSIS — Z79899 Other long term (current) drug therapy: Secondary | ICD-10-CM | POA: Insufficient documentation

## 2016-11-10 DIAGNOSIS — I6529 Occlusion and stenosis of unspecified carotid artery: Secondary | ICD-10-CM | POA: Diagnosis not present

## 2016-11-10 DIAGNOSIS — I110 Hypertensive heart disease with heart failure: Secondary | ICD-10-CM | POA: Insufficient documentation

## 2016-11-10 DIAGNOSIS — R5383 Other fatigue: Secondary | ICD-10-CM | POA: Insufficient documentation

## 2016-11-10 DIAGNOSIS — I480 Paroxysmal atrial fibrillation: Secondary | ICD-10-CM | POA: Diagnosis present

## 2016-11-10 NOTE — Patient Instructions (Signed)
We will contact you in 4 months to schedule your next appointment.  

## 2016-11-12 NOTE — Progress Notes (Signed)
Patient ID: Julie SageNancy S Swanson, female   DOB: Dec 31, 1927, 83 y.o.   MRN: [id number] PCP: Dr. Lilyan PuntScott Luking Cardiology: Dr. Shirlee LatchMcLean  83 yo presents for followup of paroxysmal atrial fibrillation.  She had been having runs of rapid tachypalpitations when I saw her initially.  She was quite symptomatic with episodes.  Event monitor showed runs of atrial fibrillation with RVR.  I started her on Multaq for rhythm control given her significant symptoms and on apixaban.  She did not tolerate diltiazem due to ankle swelling and was switched over to Coreg for rate control.  She did not tolerate Coreg and is back on propranolol, which she does ok with.  Echo in 11/14 showed EF 60-65% with mild MR. No exertional chest pain or dyspnea. No lightheadedness.  She went into atrial fibrillation again in 8/16.  She had DCCV in 9/16 back to NSR and was continued on Multaq.  However, she went back into atrial fibrillation shortly after on despite Multaq use.  Multaq was stopped.  Given ongoing fatigue with atrial fibrillation, she was started on amiodarone.  DCCV was done in 12/16, she went back into NSR.  Propranolol was stopped due to bradycardia.  Unfortunately, atrial fibrillation has recurred.  She saw [name] and it was decided to try to reload her on amiodarone and attempt DCCV one more time.  Later in 12/16, I cardioverted her again while on amiodarone.  However, at 09/10/15 appt in atrial fibrillation clinic, she was back in atrial fibrillation.  She developed a tremor on amiodarone.  Amiodarone was stopped with plan to rate control/anticoagulate going forward.  She is now on Toprol XL and diltiazem CD.  Remains in atrial fibrillation.  HR better controlled, generally in 80s-90s but still goes > 100 bpm at times.  BP high today but SBP 120s-130s at home.  She continues to have occasional hemorrhoidal bleeding though this seems to have become less frequent.  Less fatigued/better energy with decrease in Toprol XL.  She gets  tired very easily.  Legs "wear out."  ABIs normal in 2/18.  Does not feel like doing much.  No exertional dyspnea though not very active.  No chest pain.  No lightheadedness/syncope.    Labs (10/14): LDL 90, HDL 41, K 4.1, creatinine 0.9 Labs (4/15): LDL 74, HDL 40, K 4.1, creatinine 4.090.87 Labs (10/15): LDL 73, HDL 38 Labs (12/15): K 4, creatinine 0.9, HCT 36 Labs (8/16): K 4, creatinine 0.97, HCT 37.8, TSH normal Labs (9/16): K 4.6, creatinine 1.06, LDL 68, HDL 47 Labs (11/16): TSH normal, HCT 34.1, LFTs normal Labs (12/16): creatinine 0.96, HCT 35.9 Labs (5/17): K 4.1, creatinine 0.99, LDL 77, HDL 38 Labs (2/18): K 4, creatinine 1.05  PMH: 1. LHC (8/10) with minimal nonobstructive disease.  ETT (10/13) with 2'53" exercise, nonspecific ECG changes with no evidence for ischemia.  2. Mitral valve prolapse: Echo (11/14) with EF 60-65%, mild MR.  3. Atrial fibrillation:  Event monitor (10/13) with PACs only.  Event monitor (11/14) showed atrial fibrillation with RVR. She does not tolerate metoprolol (nightmares).  She had ankle swelling with diltiazem. Possible fatigue/diarrhea with Coreg. Holter (8/16) with persistent atrial fibrillation.  DCCV to NSR in 9/16.  Back in atrial fibrillation by 10/16 despite Multaq. DCCV to NSR x 2 in 12/16 on amiodarone, but atrial fibrillation recurred.  Amiodarone stopped. 4. HTN 5. Hyperlipidemia 6. Hysterectomy. 7. Carotid disease: Carotid dopplers (1/15) with prominent plaque, mild stenosis.  8. Internal hemorrhoids with episodic rectal bleeding.  9. Macular degeneration.  10. PFTs: Mild obstructive lung disease.  11. PAD: ABIs (2/17) were normal, but there was evidence for "developing fem-pop disease."  ABIs normal in 2/18.  12. Pancreatic cysts on CT.   SH: Widow, lives in [place], nonsmoker.  2 daughters.   FH: Mother with CVA, CAD.  Father with CAD.   ROS: All systems reviewed and negative except as per HPI.   Current Outpatient Prescriptions   Medication Sig Dispense Refill  . acetaminophen (TYLENOL) 500 MG tablet Take 500 mg by mouth 2 (two) times daily as needed for moderate pain.    Marland Kitchen apixaban (ELIQUIS) 5 MG TABS tablet Take 1 tablet (5 mg total) by mouth 2 (two) times daily. 60 tablet 12  . atorvastatin (LIPITOR) 10 MG tablet Take 1 tablet (10 mg total) by mouth daily. 30 tablet 12  . cycloSPORINE (RESTASIS) 0.05 % ophthalmic emulsion Place 1 drop into both eyes daily.     Marland Kitchen diltiazem (CARDIZEM CD) 240 MG 24 hr capsule Take 1 capsule (240 mg total) by mouth daily. 30 capsule 6  . furosemide (LASIX) 40 MG tablet Take 1 tablet (40 mg total) by mouth daily. 30 tablet 6  . hydrocortisone-pramoxine (PROCTOFOAM-HC) rectal foam Place 1 applicator rectally 2 (two) times daily as needed for hemorrhoids or itching.    . Lutein 20 MG CAPS Take 1 capsule by mouth daily.     . metoprolol succinate (TOPROL-XL) 50 MG 24 hr tablet Take 1 tablet (50 mg total) by mouth 2 (two) times daily. 60 tablet 6  . Multiple Vitamins-Minerals (ICAPS) CAPS Take 2 capsules by mouth daily.    Marland Kitchen OVER THE COUNTER MEDICATION Med Name: BENE-FIBER Take two (2) teaspoons by mouth daily with 8 oz water.    . potassium chloride SA (K-DUR,KLOR-CON) 20 MEQ tablet Take 1 tablet (20 mEq total) by mouth daily. 30 tablet 6  . triamcinolone cream (KENALOG) 0.1 % Apply 1 application topically 2 (two) times daily as needed (neck).     No current facility-administered medications for this encounter.     BP (!) 152/87   Pulse 88   Wt 157 lb 4 oz (71.3 kg)   SpO2 95%   BMI 26.99 kg/m  General: NAD Neck: No JVD, no thyromegaly or thyroid nodule.  Lungs: Clear to auscultation bilaterally with normal respiratory effort. CV: Nondisplaced PMI.  Heart irregular S1/S2, no S3/S4, no murmur.  1+ edema 1/2 to knees bilaterally. Right carotid bruit. Difficult to palpate pedal pulses.  Abdomen: Soft, nontender, no hepatosplenomegaly, no distention.  Skin: Intact without lesions or  rashes.  Neurologic: Alert and oriented x 3.  Psych: Normal affect. Extremities: No clubbing or cyanosis.   Assessment/Plan: 1. Atrial fibrillation:  Persistent, has recurred after DCCV in 12/16 on amiodarone.  She has failed amiodarone and Multaq.  She is not a good sotalol or Tikosyn candidate as QTc has been prolonged.  We have decided on following a rate control and anticoagulation strategy. Less fatigued since decreasing Toprol XL and increasing diltiazem CD.  - Continue Toprol XL 50 mg bid and diltiazem CD 240 mg daily.    - Continue Eliquis.  2. HTN:  BP mildly elevated today but controlled when she checks at home.   3. Hyperlipidemia: Good lipids in 5/17.  4. Carotid bruit: Mild stenosis only.  5. Leg fatigue: ABIs normal in 2/18.   6. Chronic diastolic CHF: She has peripheral edema but no JVD.  Edema could be due to diltiazem.  -  Continue current Lasix + KCl.    Followup in 4 months.   Marca Ancona 11/12/2016

## 2016-12-07 ENCOUNTER — Telehealth (HOSPITAL_COMMUNITY): Payer: Self-pay

## 2016-12-07 NOTE — Telephone Encounter (Signed)
Take lasix 40 qam/20 qpm x 4 days then back to 40 daily.

## 2016-12-07 NOTE — Telephone Encounter (Signed)
Pt's daughter, aware and agreeable

## 2016-12-07 NOTE — Telephone Encounter (Signed)
Patient's daughter called to report patient has experienced increased fatigue and BLEE for past 2-3 weeks.  Patient has not being weighing daily and unable to tell if weight is up. Advised to take extra lasix tablet once today until Dr. Shirlee LatchMcLean could further advise. Takes 40 mg once daily. Will forward to him. Scheduled f/u apt on same day daughter is due to see Dr. Shirlee LatchMcLean (4/18). Will forward to Dr. Shirlee LatchMcLean.  Ave FilterBradley, Pennelope Basque Genevea, RN

## 2016-12-27 ENCOUNTER — Ambulatory Visit (HOSPITAL_COMMUNITY)
Admission: RE | Admit: 2016-12-27 | Discharge: 2016-12-27 | Disposition: A | Discharge status: Home / Self Care | Payer: Medicare Other | Source: Ambulatory Visit | Attending: Cardiology | Admitting: Cardiology

## 2016-12-27 ENCOUNTER — Encounter (HOSPITAL_COMMUNITY): Payer: Self-pay

## 2016-12-27 ENCOUNTER — Encounter (HOSPITAL_COMMUNITY): Payer: Medicare Other

## 2016-12-27 VITALS — BP 98/74 | HR 81 | Wt 155.5 lb

## 2016-12-27 DIAGNOSIS — R0989 Other specified symptoms and signs involving the circulatory and respiratory systems: Secondary | ICD-10-CM | POA: Diagnosis not present

## 2016-12-27 DIAGNOSIS — I480 Paroxysmal atrial fibrillation: Secondary | ICD-10-CM | POA: Insufficient documentation

## 2016-12-27 DIAGNOSIS — I5022 Chronic systolic (congestive) heart failure: Secondary | ICD-10-CM | POA: Diagnosis not present

## 2016-12-27 DIAGNOSIS — E784 Other hyperlipidemia: Secondary | ICD-10-CM | POA: Diagnosis not present

## 2016-12-27 DIAGNOSIS — I4819 Other persistent atrial fibrillation: Secondary | ICD-10-CM

## 2016-12-27 DIAGNOSIS — Z7901 Long term (current) use of anticoagulants: Secondary | ICD-10-CM | POA: Insufficient documentation

## 2016-12-27 DIAGNOSIS — I5032 Chronic diastolic (congestive) heart failure: Secondary | ICD-10-CM | POA: Diagnosis not present

## 2016-12-27 DIAGNOSIS — I481 Persistent atrial fibrillation: Secondary | ICD-10-CM | POA: Insufficient documentation

## 2016-12-27 DIAGNOSIS — R5383 Other fatigue: Secondary | ICD-10-CM | POA: Diagnosis not present

## 2016-12-27 DIAGNOSIS — Z79899 Other long term (current) drug therapy: Secondary | ICD-10-CM | POA: Diagnosis not present

## 2016-12-27 DIAGNOSIS — I110 Hypertensive heart disease with heart failure: Secondary | ICD-10-CM | POA: Insufficient documentation

## 2016-12-27 LAB — BASIC METABOLIC PANEL
Anion gap: 7 (ref 5–15)
BUN: 13 mg/dL (ref 6–20)
CHLORIDE: 102 mmol/L (ref 101–111)
CO2: 29 mmol/L (ref 22–32)
CREATININE: 1.01 mg/dL — AB (ref 0.44–1.00)
Calcium: 9 mg/dL (ref 8.9–10.3)
GFR calc Af Amer: 56 mL/min — ABNORMAL LOW (ref >60)
GFR calc non Af Amer: 49 mL/min — ABNORMAL LOW (ref >60)
GLUCOSE: 121 mg/dL — AB (ref 65–99)
POTASSIUM: 3.7 mmol/L (ref 3.5–5.1)
Sodium: 138 mmol/L (ref 135–145)

## 2016-12-27 LAB — CBC
HCT: 35.2 % — ABNORMAL LOW (ref 36.0–46.0)
HEMOGLOBIN: 11.9 g/dL — AB (ref 12.0–15.0)
MCH: 30.7 pg (ref 26.0–34.0)
MCHC: 33.8 g/dL (ref 30.0–36.0)
MCV: 90.7 fL (ref 78.0–100.0)
Platelets: 218 10*3/uL (ref 150–400)
RBC: 3.88 MIL/uL (ref 3.87–5.11)
RDW: 13.5 % (ref 11.5–15.5)
WBC: 5.9 10*3/uL (ref 4.0–10.5)

## 2016-12-27 LAB — BRAIN NATRIURETIC PEPTIDE: B Natriuretic Peptide: 320.9 pg/mL — ABNORMAL HIGH (ref 0.0–100.0)

## 2016-12-27 MED ORDER — FUROSEMIDE 40 MG PO TABS: ORAL_TABLET | ORAL | 6 refills | Status: DC

## 2016-12-27 NOTE — Patient Instructions (Signed)
INCREASE Lasix to 40 mg (1 tab) in am and 20 mg (1/2 tab) in pm.  Routine lab work today. Will notify you of abnormal results, otherwise no news is good news!  Return in 2 weeks to repeat labs.  _________________________________________________  _________________________________________________  Follow up in 6-8 weeks with Dr. Shirlee Latch.  _________________________________________________  _________________________________________________  Do the following things EVERYDAY: 1) Weigh yourself in the morning before breakfast. Write it down and keep it in a log. 2) Take your medicines as prescribed 3) Eat low salt foods-Limit salt (sodium) to 2000 mg per day.  4) Stay as active as you can everyday 5) Limit all fluids for the day to less than 2 liters

## 2016-12-28 NOTE — Progress Notes (Signed)
Patient ID: Julie Swanson, female   DOB: 1928/02/18, 83 y.o.   MRN: [id number] PCP: Dr. Lilyan Punt Cardiology: Dr. Shirlee Latch  83 yo presents for followup of paroxysmal atrial fibrillation.  She had been having runs of rapid tachypalpitations when I saw her initially.  She was quite symptomatic with episodes.  Event monitor showed runs of atrial fibrillation with RVR.  I started her on Multaq for rhythm control given her significant symptoms and on apixaban.  She did not tolerate diltiazem due to ankle swelling and was switched over to Coreg for rate control.  She did not tolerate Coreg and is back on propranolol, which she does ok with.  Echo in 11/14 showed EF 60-65% with mild MR. No exertional chest pain or dyspnea. No lightheadedness.  She went into atrial fibrillation again in 8/16.  She had DCCV in 9/16 back to NSR and was continued on Multaq.  However, she went back into atrial fibrillation shortly after on despite Multaq use.  Multaq was stopped.  Given ongoing fatigue with atrial fibrillation, she was started on amiodarone.  DCCV was done in 12/16, she went back into NSR.  Propranolol was stopped due to bradycardia.  Unfortunately, atrial fibrillation has recurred.  She saw Tereso Newcomer and it was decided to try to reload her on amiodarone and attempt DCCV one more time.  Later in 12/16, I cardioverted her again while on amiodarone.  However, at 09/10/15 appt in atrial fibrillation clinic, she was back in atrial fibrillation.  She developed a tremor on amiodarone.  Amiodarone was stopped with plan to rate control/anticoagulate going forward.  She is now on Toprol XL and diltiazem CD.  She remains in atrial fibrillation.  HR 80s-100s at home.  No recent hemorrhoidal bleeding.  Walks 10-15 minutes at a time, legs get very fatigued but not particularly short of breath.  She gets very fatigued walking up inclines.  No chest pain.  Daughter thinks she seems more alert on lower dose of Toprol XL.  No  lightheadedness/syncope.    Labs (10/14): LDL 90, HDL 41, K 4.1, creatinine 0.9 Labs (4/15): LDL 74, HDL 40, K 4.1, creatinine 4.69 Labs (10/15): LDL 73, HDL 38 Labs (12/15): K 4, creatinine 0.9, HCT 36 Labs (8/16): K 4, creatinine 0.97, HCT 37.8, TSH normal Labs (9/16): K 4.6, creatinine 1.06, LDL 68, HDL 47 Labs (11/16): TSH normal, HCT 34.1, LFTs normal Labs (12/16): creatinine 0.96, HCT 35.9 Labs (5/17): K 4.1, creatinine 0.99, LDL 77, HDL 38 Labs (2/18): K 4, creatinine 1.05  PMH: 1. LHC (8/10) with minimal nonobstructive disease.  ETT (10/13) with 2'53" exercise, nonspecific ECG changes with no evidence for ischemia.  2. Mitral valve prolapse: Echo (11/14) with EF 60-65%, mild MR.  3. Atrial fibrillation:  Event monitor (10/13) with PACs only.  Event monitor (11/14) showed atrial fibrillation with RVR. She does not tolerate metoprolol (nightmares).  She had ankle swelling with diltiazem. Possible fatigue/diarrhea with Coreg. Holter (8/16) with persistent atrial fibrillation.  DCCV to NSR in 9/16.  Back in atrial fibrillation by 10/16 despite Multaq. DCCV to NSR x 2 in 12/16 on amiodarone, but atrial fibrillation recurred.  Amiodarone stopped. 4. HTN 5. Hyperlipidemia 6. Hysterectomy. 7. Carotid disease: Carotid dopplers (1/15) with prominent plaque, mild stenosis.  8. Internal hemorrhoids with episodic rectal bleeding.  9. Macular degeneration.  10. PFTs: Mild obstructive lung disease.  11. PAD: ABIs (2/17) were normal, but there was evidence for "developing fem-pop disease."  ABIs normal in 2/18.  12. Pancreatic cysts on CT.   SH: Widow, lives in [place], nonsmoker.  2 daughters.   FH: Mother with CVA, CAD.  Father with CAD.   ROS: All systems reviewed and negative except as per HPI.   Current Outpatient Prescriptions  Medication Sig Dispense Refill  . acetaminophen (TYLENOL) 500 MG tablet Take 500 mg by mouth 2 (two) times daily as needed for moderate pain.    Marland Kitchen  apixaban (ELIQUIS) 5 MG TABS tablet Take 1 tablet (5 mg total) by mouth 2 (two) times daily. 60 tablet 12  . atorvastatin (LIPITOR) 10 MG tablet Take 1 tablet (10 mg total) by mouth daily. 30 tablet 12  . cycloSPORINE (RESTASIS) 0.05 % ophthalmic emulsion Place 1 drop into both eyes daily.     Marland Kitchen diltiazem (CARDIZEM CD) 240 MG 24 hr capsule Take 1 capsule (240 mg total) by mouth daily. 30 capsule 6  . furosemide (LASIX) 40 MG tablet Take 40 mg (1 tab) in am and 20 mg (1/2 tab) in pm 45 tablet 6  . hydrocortisone-pramoxine (PROCTOFOAM-HC) rectal foam Place 1 applicator rectally 2 (two) times daily as needed for hemorrhoids or itching.    . Lutein 20 MG CAPS Take 1 capsule by mouth daily.     . metoprolol succinate (TOPROL-XL) 50 MG 24 hr tablet Take 1 tablet (50 mg total) by mouth 2 (two) times daily. 60 tablet 6  . Multiple Vitamins-Minerals (ICAPS) CAPS Take 2 capsules by mouth daily.    Marland Kitchen OVER THE COUNTER MEDICATION Med Name: BENE-FIBER Take two (2) teaspoons by mouth daily with 8 oz water.    . potassium chloride SA (K-DUR,KLOR-CON) 20 MEQ tablet Take 1 tablet (20 mEq total) by mouth daily. 30 tablet 6  . triamcinolone cream (KENALOG) 0.1 % Apply 1 application topically 2 (two) times daily as needed (neck).     No current facility-administered medications for this encounter.     BP 98/74   Pulse 81 Comment: irregular  Wt 155 lb 8 oz (70.5 kg)   SpO2 99%   BMI 26.69 kg/m  General: NAD Neck: JVP 8 cm + HJR, no thyromegaly or thyroid nodule.  Lungs: CTAB CV: Nondisplaced PMI.  Heart irregular S1/S2, no S3/S4, no murmur.  1+ edema 1/2 to knees bilaterally. Right carotid bruit. Difficult to palpate pedal pulses.  Abdomen: Soft, nontender, no hepatosplenomegaly, no distention.  Skin: Intact without lesions or rashes.  Neurologic: Alert and oriented x 3.  Psych: Normal affect. Extremities: No clubbing or cyanosis.   Assessment/Plan: 1. Atrial fibrillation:  Persistent, has recurred after  DCCV in 12/16 on amiodarone.  She has failed amiodarone and Multaq.  She is not a good sotalol or Tikosyn candidate as QTc has been prolonged.  We have decided on following a rate control and anticoagulation strategy. Less fatigued since decreasing Toprol XL and increasing diltiazem CD.  - Continue Toprol XL 50 mg bid and diltiazem CD 240 mg daily.   Would not decrease Toprol XL further as HR is mildly elevated at times.  Would hold off on increasing diltiazem CD with peripheral edema.  - Continue Eliquis, CBC today.  2. HTN: BP not elevated.   3. Hyperlipidemia: Good lipids in 5/17.  4. Carotid bruit: Mild stenosis only.  5. Leg fatigue: ABIs normal in 2/18.   6. Chronic diastolic CHF: Mild volume overload today with JVD.   - Increase Lasix to 40 qam/20 qpm. BMET/BNP today and repeat in 2 wks.    Followup in 6  wks.    Marca Ancona 12/28/2016

## 2017-01-10 ENCOUNTER — Ambulatory Visit (HOSPITAL_COMMUNITY)
Admission: RE | Admit: 2017-01-10 | Discharge: 2017-01-10 | Disposition: A | Discharge status: Home / Self Care | Payer: Medicare Other | Source: Ambulatory Visit | Attending: Internal Medicine | Admitting: Internal Medicine

## 2017-01-10 DIAGNOSIS — I5022 Chronic systolic (congestive) heart failure: Secondary | ICD-10-CM | POA: Insufficient documentation

## 2017-01-10 LAB — BASIC METABOLIC PANEL
Anion gap: 6 (ref 5–15)
BUN: 16 mg/dL (ref 6–20)
CHLORIDE: 101 mmol/L (ref 101–111)
CO2: 29 mmol/L (ref 22–32)
CREATININE: 1.06 mg/dL — AB (ref 0.44–1.00)
Calcium: 9.2 mg/dL (ref 8.9–10.3)
GFR calc Af Amer: 53 mL/min — ABNORMAL LOW (ref >60)
GFR, EST NON AFRICAN AMERICAN: 46 mL/min — AB (ref >60)
Glucose, Bld: 137 mg/dL — ABNORMAL HIGH (ref 65–99)
POTASSIUM: 3.9 mmol/L (ref 3.5–5.1)
SODIUM: 136 mmol/L (ref 135–145)

## 2017-02-08 ENCOUNTER — Encounter (HOSPITAL_COMMUNITY): Payer: Self-pay

## 2017-02-08 ENCOUNTER — Ambulatory Visit (HOSPITAL_COMMUNITY)
Admission: RE | Admit: 2017-02-08 | Discharge: 2017-02-08 | Disposition: A | Discharge status: Home / Self Care | Payer: Medicare Other | Source: Ambulatory Visit | Attending: Cardiology | Admitting: Cardiology

## 2017-02-08 VITALS — BP 125/80 | HR 94 | Wt 154.5 lb

## 2017-02-08 DIAGNOSIS — I5032 Chronic diastolic (congestive) heart failure: Secondary | ICD-10-CM

## 2017-02-08 DIAGNOSIS — Z79899 Other long term (current) drug therapy: Secondary | ICD-10-CM | POA: Diagnosis not present

## 2017-02-08 DIAGNOSIS — I110 Hypertensive heart disease with heart failure: Secondary | ICD-10-CM | POA: Insufficient documentation

## 2017-02-08 DIAGNOSIS — Z7901 Long term (current) use of anticoagulants: Secondary | ICD-10-CM | POA: Diagnosis not present

## 2017-02-08 DIAGNOSIS — H3530 Unspecified macular degeneration: Secondary | ICD-10-CM | POA: Diagnosis not present

## 2017-02-08 DIAGNOSIS — E785 Hyperlipidemia, unspecified: Secondary | ICD-10-CM | POA: Diagnosis not present

## 2017-02-08 DIAGNOSIS — I481 Persistent atrial fibrillation: Secondary | ICD-10-CM | POA: Insufficient documentation

## 2017-02-08 DIAGNOSIS — R5383 Other fatigue: Secondary | ICD-10-CM | POA: Insufficient documentation

## 2017-02-08 DIAGNOSIS — R0989 Other specified symptoms and signs involving the circulatory and respiratory systems: Secondary | ICD-10-CM | POA: Diagnosis not present

## 2017-02-08 DIAGNOSIS — Z8249 Family history of ischemic heart disease and other diseases of the circulatory system: Secondary | ICD-10-CM | POA: Diagnosis not present

## 2017-02-08 DIAGNOSIS — I4891 Unspecified atrial fibrillation: Secondary | ICD-10-CM | POA: Diagnosis present

## 2017-02-08 DIAGNOSIS — I6529 Occlusion and stenosis of unspecified carotid artery: Secondary | ICD-10-CM | POA: Insufficient documentation

## 2017-02-08 LAB — BASIC METABOLIC PANEL
Anion gap: 7 (ref 5–15)
BUN: 15 mg/dL (ref 6–20)
CALCIUM: 9.1 mg/dL (ref 8.9–10.3)
CO2: 29 mmol/L (ref 22–32)
CREATININE: 1.06 mg/dL — AB (ref 0.44–1.00)
Chloride: 102 mmol/L (ref 101–111)
GFR calc non Af Amer: 46 mL/min — ABNORMAL LOW (ref >60)
GFR, EST AFRICAN AMERICAN: 53 mL/min — AB (ref >60)
Glucose, Bld: 97 mg/dL (ref 65–99)
Potassium: 4.2 mmol/L (ref 3.5–5.1)
SODIUM: 138 mmol/L (ref 135–145)

## 2017-02-08 LAB — LIPID PANEL
CHOL/HDL RATIO: 3.8 ratio
Cholesterol: 125 mg/dL (ref 0–200)
HDL: 33 mg/dL — ABNORMAL LOW (ref >40)
LDL CALC: 62 mg/dL (ref 0–99)
Triglycerides: 149 mg/dL (ref <150)
VLDL: 30 mg/dL (ref 0–40)

## 2017-02-08 MED ORDER — ATORVASTATIN CALCIUM 10 MG PO TABS: 10.0000 mg | ORAL_TABLET | Freq: Every day | ORAL | 3 refills | Status: DC

## 2017-02-08 MED ORDER — DILTIAZEM HCL ER COATED BEADS 240 MG PO CP24: 240.0000 mg | ORAL_CAPSULE | Freq: Every day | ORAL | 6 refills | Status: DC

## 2017-02-08 MED ORDER — POTASSIUM CHLORIDE CRYS ER 20 MEQ PO TBCR: 20.0000 meq | EXTENDED_RELEASE_TABLET | Freq: Every day | ORAL | 6 refills | Status: DC

## 2017-02-08 MED ORDER — FUROSEMIDE 40 MG PO TABS: ORAL_TABLET | ORAL | 6 refills | Status: DC

## 2017-02-08 MED ORDER — APIXABAN 5 MG PO TABS: 5.0000 mg | ORAL_TABLET | Freq: Two times a day (BID) | ORAL | 3 refills | Status: DC

## 2017-02-08 MED ORDER — METOPROLOL SUCCINATE ER 50 MG PO TB24: 50.0000 mg | ORAL_TABLET | Freq: Two times a day (BID) | ORAL | 6 refills | Status: DC

## 2017-02-08 NOTE — Progress Notes (Signed)
Patient ID: [name], female   DOB: 11/03/1927, 84 y.o.   MRN: [id number] PCP: Dr. Lilyan Punt Cardiology: Dr. Shirlee Latch  82 yo presents for followup of paroxysmal atrial fibrillation and diastolic CHF.  She had been having runs of rapid tachypalpitations when I saw her initially.  She was quite symptomatic with episodes.  Event monitor showed runs of atrial fibrillation with RVR.  I started her on Multaq for rhythm control given her significant symptoms and on apixaban.  She did not tolerate diltiazem due to ankle swelling and was switched over to Coreg for rate control.  She did not tolerate Coreg and is back on propranolol, which she does ok with.  Echo in 11/14 showed EF 60-65% with mild MR. No exertional chest pain or dyspnea. No lightheadedness.  She went into atrial fibrillation again in 8/16.  She had DCCV in 9/16 back to NSR and was continued on Multaq.  However, she went back into atrial fibrillation shortly after on despite Multaq use.  Multaq was stopped.  Given ongoing fatigue with atrial fibrillation, she was started on amiodarone.  DCCV was done in 12/16, she went back into NSR.  Propranolol was stopped due to bradycardia.  Unfortunately, atrial fibrillation has recurred.  She saw Tereso Newcomer and it was decided to try to reload her on amiodarone and attempt DCCV one more time.  Later in 12/16, I cardioverted her again while on amiodarone.  However, at 09/10/15 appt in atrial fibrillation clinic, she was back in atrial fibrillation.  She developed a tremor on amiodarone.  Amiodarone was stopped with plan to rate control/anticoagulate going forward.  She is now on Toprol XL and diltiazem CD.  She remains in atrial fibrillation.  HR 80s-100s at home.  No recent hemorrhoidal bleeding.  Walks 10-15 minutes daily (about 1/4 mile), legs get fatigued but not particularly short of breath.  She gets very fatigued walking up inclines.  No chest pain.  No lightheadedness, no palpitations. Daughter thinks  she seems more alert on lower dose of Toprol XL.     Labs (10/14): LDL 90, HDL 41, K 4.1, creatinine 0.9 Labs (4/15): LDL 74, HDL 40, K 4.1, creatinine 5.36 Labs (10/15): LDL 73, HDL 38 Labs (12/15): K 4, creatinine 0.9, HCT 36 Labs (8/16): K 4, creatinine 0.97, HCT 37.8, TSH normal Labs (9/16): K 4.6, creatinine 1.06, LDL 68, HDL 47 Labs (11/16): TSH normal, HCT 34.1, LFTs normal Labs (12/16): creatinine 0.96, HCT 35.9 Labs (5/17): K 4.1, creatinine 0.99, LDL 77, HDL 38 Labs (2/18): K 4, creatinine 1.05 Labs (4/18): BNP 321 Labs (5/18): K 3.9, creatinine 1.06  PMH: 1. LHC (8/10) with minimal nonobstructive disease.  ETT (10/13) with 2'53" exercise, nonspecific ECG changes with no evidence for ischemia.  2. Mitral valve prolapse: Echo (11/14) with EF 60-65%, mild MR.  3. Atrial fibrillation:  Event monitor (10/13) with PACs only.  Event monitor (11/14) showed atrial fibrillation with RVR. She does not tolerate metoprolol (nightmares).  She had ankle swelling with diltiazem. Possible fatigue/diarrhea with Coreg. Holter (8/16) with persistent atrial fibrillation.  DCCV to NSR in 9/16.  Back in atrial fibrillation by 10/16 despite Multaq. DCCV to NSR x 2 in 12/16 on amiodarone, but atrial fibrillation recurred.  Amiodarone stopped. 4. HTN 5. Hyperlipidemia 6. Hysterectomy. 7. Carotid disease: Carotid dopplers (1/15) with prominent plaque, mild stenosis.  8. Internal hemorrhoids with episodic rectal bleeding.  9. Macular degeneration.  10. PFTs: Mild obstructive lung disease.  11. PAD: ABIs (2/17)  were normal, but there was evidence for "developing fem-pop disease."  ABIs normal in 2/18.  12. Pancreatic cysts on CT.   SH: Widow, lives in [place], nonsmoker.  2 daughters.   FH: Mother with CVA, CAD.  Father with CAD.   ROS: All systems reviewed and negative except as per HPI.   Current Outpatient Prescriptions  Medication Sig Dispense Refill  . acetaminophen (TYLENOL) 500 MG tablet  Take 500 mg by mouth 2 (two) times daily as needed for moderate pain.    Marland Kitchen apixaban (ELIQUIS) 5 MG TABS tablet Take 1 tablet (5 mg total) by mouth 2 (two) times daily. 180 tablet 3  . atorvastatin (LIPITOR) 10 MG tablet Take 1 tablet (10 mg total) by mouth daily. 90 tablet 3  . cycloSPORINE (RESTASIS) 0.05 % ophthalmic emulsion Place 1 drop into both eyes daily.     Marland Kitchen diltiazem (CARDIZEM CD) 240 MG 24 hr capsule Take 1 capsule (240 mg total) by mouth daily. 90 capsule 6  . furosemide (LASIX) 40 MG tablet Take 40 mg (1 tab) in am and 20 mg (1/2 tab) in pm 135 tablet 6  . hydrocortisone-pramoxine (PROCTOFOAM-HC) rectal foam Place 1 applicator rectally 2 (two) times daily as needed for hemorrhoids or itching.    . Lutein 20 MG CAPS Take 1 capsule by mouth daily.     . metoprolol succinate (TOPROL-XL) 50 MG 24 hr tablet Take 1 tablet (50 mg total) by mouth 2 (two) times daily. 180 tablet 6  . Multiple Vitamins-Minerals (ICAPS) CAPS Take 2 capsules by mouth daily.    Marland Kitchen OVER THE COUNTER MEDICATION Med Name: BENE-FIBER Take two (2) teaspoons by mouth daily with 8 oz water.    . potassium chloride SA (K-DUR,KLOR-CON) 20 MEQ tablet Take 1 tablet (20 mEq total) by mouth daily. 90 tablet 6  . triamcinolone cream (KENALOG) 0.1 % Apply 1 application topically 2 (two) times daily as needed (neck).     No current facility-administered medications for this encounter.     BP 125/80   Pulse 94   Wt 154 lb 8 oz (70.1 kg)   SpO2 100%   BMI 26.52 kg/m  General: NAD Neck: JVP 7 cm, no thyromegaly or thyroid nodule.  Lungs: CTAB CV: Nondisplaced PMI.  Heart irregular S1/S2, no S3/S4, no murmur.  1+ edema 3/4 to knees bilaterally. Right carotid bruit. Difficult to palpate pedal pulses.  Abdomen: Soft, nontender, no hepatosplenomegaly, no distention.  Skin: Intact without lesions or rashes.  Neurologic: Alert and oriented x 3.  Psych: Normal affect. Extremities: No clubbing or cyanosis.    Assessment/Plan: 1. Atrial fibrillation:  Persistent, has recurred after DCCV in 12/16 on amiodarone.  She has failed amiodarone and Multaq.  She is not a good sotalol or Tikosyn candidate as QTc has been prolonged.  We have decided on following a rate control and anticoagulation strategy. Less fatigued since decreasing Toprol XL and increasing diltiazem CD.  - Continue Toprol XL 50 mg bid and diltiazem CD 240 mg daily.   Would not decrease Toprol XL further as HR is mildly elevated at times.  Would hold off on increasing diltiazem CD with peripheral edema.  - Continue Eliquis. 2. HTN: BP not elevated.   3. Hyperlipidemia: Check lipids today.   4. Carotid bruit: Mild stenosis only.  5. Leg fatigue: ABIs normal in 2/18.   6. Chronic diastolic CHF: She does not look volume overloaded. Peripheral edema likely related to diltiazem, no JVD.   - Continue  Lasix 40 qam/20 qpm. BMET today.   - Continue to wear compression stockings.   Followup in 4 months    Marca AnconaDalton Julyan Gales 02/08/2017

## 2017-02-08 NOTE — Patient Instructions (Signed)
Routine lab work today. Will notify you of abnormal results  Follow up with Dr.McLean in 4 months 

## 2017-02-12 ENCOUNTER — Telehealth: Payer: Self-pay | Admitting: Family Medicine

## 2017-02-12 DIAGNOSIS — D508 Other iron deficiency anemias: Secondary | ICD-10-CM

## 2017-02-12 NOTE — Telephone Encounter (Signed)
Pt is requesting lab orders to be sent over. Last labs per epic were: bmp,lipid on 02/08/17.

## 2017-02-12 NOTE — Telephone Encounter (Signed)
Scott to see when he comes back. She is not coming in until July. Needs labs ordered for that visit.

## 2017-02-18 NOTE — Telephone Encounter (Signed)
Please let the patient know that her lab work from in the main with the kidneys and cholesterol overall looks good no need to repeat these. I would recommend CBC fair 10 because of iron deficient anemia

## 2017-02-19 NOTE — Telephone Encounter (Signed)
Left message return call 02/19/17 

## 2017-02-20 NOTE — Telephone Encounter (Signed)
Discussed with pt. Orders put in for [place]. Cbc and ferritin.

## 2017-03-21 LAB — CBC WITH DIFFERENTIAL/PLATELET
BASOS ABS: 0 10*3/uL (ref 0.0–0.2)
BASOS: 1 %
EOS (ABSOLUTE): 0.1 10*3/uL (ref 0.0–0.4)
Eos: 2 %
HEMOGLOBIN: 12.3 g/dL (ref 11.1–15.9)
Hematocrit: 35.9 % (ref 34.0–46.6)
IMMATURE GRANS (ABS): 0 10*3/uL (ref 0.0–0.1)
Immature Granulocytes: 0 %
LYMPHS ABS: 1.4 10*3/uL (ref 0.7–3.1)
LYMPHS: 33 %
MCH: 30.6 pg (ref 26.6–33.0)
MCHC: 34.3 g/dL (ref 31.5–35.7)
MCV: 89 fL (ref 79–97)
MONOCYTES: 7 %
Monocytes Absolute: 0.3 10*3/uL (ref 0.1–0.9)
Neutrophils Absolute: 2.5 10*3/uL (ref 1.4–7.0)
Neutrophils: 57 %
Platelets: 212 10*3/uL (ref 150–379)
RBC: 4.02 x10E6/uL (ref 3.77–5.28)
RDW: 13.4 % (ref 12.3–15.4)
WBC: 4.4 10*3/uL (ref 3.4–10.8)

## 2017-03-21 LAB — FERRITIN: FERRITIN: 59 ng/mL (ref 15–150)

## 2017-03-26 ENCOUNTER — Ambulatory Visit (INDEPENDENT_AMBULATORY_CARE_PROVIDER_SITE_OTHER): Payer: Medicare Other | Admitting: Family Medicine

## 2017-03-26 ENCOUNTER — Encounter: Payer: Self-pay | Admitting: Family Medicine

## 2017-03-26 VITALS — BP 132/86 | Ht 64.0 in | Wt 152.0 lb

## 2017-03-26 DIAGNOSIS — I10 Essential (primary) hypertension: Secondary | ICD-10-CM

## 2017-03-26 DIAGNOSIS — I482 Chronic atrial fibrillation, unspecified: Secondary | ICD-10-CM

## 2017-03-26 NOTE — Progress Notes (Signed)
   Subjective:    Patient ID: Julie Swanson, female    DOB: 1927-10-05, 83 y.o.   MRN: [id number]  Hypertension  This is a chronic problem. The current episode started more than 1 year ago. Pertinent negatives include no chest pain, headaches or shortness of breath. There are no associated agents to hypertension. Risk factors for coronary artery disease include dyslipidemia. Past treatments include calcium channel blockers, beta blockers and diuretics. The current treatment provides moderate improvement. There are no compliance problems (walks 10 mins a day, eats healty, takes meds every day).    Pt states no concerns today.    Review of Systems  Constitutional: Negative for activity change, fatigue and fever.  Respiratory: Negative for cough and shortness of breath.   Cardiovascular: Negative for chest pain and leg swelling.  Neurological: Negative for headaches.       Objective:   Physical Exam  Constitutional: She appears well-nourished. No distress.  Cardiovascular: Normal rate and normal heart sounds.   No murmur heard. Pulmonary/Chest: Effort normal and breath sounds normal. No respiratory distress.  Musculoskeletal: She exhibits no edema.  Lymphadenopathy:    She has no cervical adenopathy.  Neurological: She is alert. She exhibits normal muscle tone.  Psychiatric: Her behavior is normal.  Vitals reviewed.         Assessment & Plan:  Atrial fibrillation continue current medication no sign of bleeding not having any complications at this point blood pressure under good control minimal drop with standing  HTN decent control overall  Fatigue tiredness probably related to medications medical conditions as well as her age stay active as best as possible follow-up in 4-5 months

## 2017-04-02 ENCOUNTER — Other Ambulatory Visit: Payer: Self-pay

## 2017-05-29 ENCOUNTER — Encounter (HOSPITAL_COMMUNITY): Payer: Self-pay | Admitting: Cardiology

## 2017-05-29 ENCOUNTER — Ambulatory Visit (HOSPITAL_COMMUNITY)
Admission: RE | Admit: 2017-05-29 | Discharge: 2017-05-29 | Disposition: A | Discharge status: Home / Self Care | Payer: Medicare Other | Source: Ambulatory Visit | Attending: Cardiology | Admitting: Cardiology

## 2017-05-29 VITALS — BP 126/67 | HR 86 | Wt 152.5 lb

## 2017-05-29 DIAGNOSIS — Z79899 Other long term (current) drug therapy: Secondary | ICD-10-CM | POA: Diagnosis not present

## 2017-05-29 DIAGNOSIS — E7800 Pure hypercholesterolemia, unspecified: Secondary | ICD-10-CM

## 2017-05-29 DIAGNOSIS — I10 Essential (primary) hypertension: Secondary | ICD-10-CM | POA: Diagnosis not present

## 2017-05-29 DIAGNOSIS — H3530 Unspecified macular degeneration: Secondary | ICD-10-CM | POA: Diagnosis not present

## 2017-05-29 DIAGNOSIS — I110 Hypertensive heart disease with heart failure: Secondary | ICD-10-CM | POA: Diagnosis not present

## 2017-05-29 DIAGNOSIS — I5032 Chronic diastolic (congestive) heart failure: Secondary | ICD-10-CM | POA: Insufficient documentation

## 2017-05-29 DIAGNOSIS — Z7901 Long term (current) use of anticoagulants: Secondary | ICD-10-CM | POA: Insufficient documentation

## 2017-05-29 DIAGNOSIS — R5383 Other fatigue: Secondary | ICD-10-CM | POA: Insufficient documentation

## 2017-05-29 DIAGNOSIS — I4891 Unspecified atrial fibrillation: Secondary | ICD-10-CM

## 2017-05-29 DIAGNOSIS — E785 Hyperlipidemia, unspecified: Secondary | ICD-10-CM | POA: Insufficient documentation

## 2017-05-29 DIAGNOSIS — Z823 Family history of stroke: Secondary | ICD-10-CM | POA: Diagnosis not present

## 2017-05-29 DIAGNOSIS — I481 Persistent atrial fibrillation: Secondary | ICD-10-CM | POA: Insufficient documentation

## 2017-05-29 DIAGNOSIS — I739 Peripheral vascular disease, unspecified: Secondary | ICD-10-CM | POA: Diagnosis not present

## 2017-05-29 DIAGNOSIS — Z8249 Family history of ischemic heart disease and other diseases of the circulatory system: Secondary | ICD-10-CM | POA: Insufficient documentation

## 2017-05-29 DIAGNOSIS — I6529 Occlusion and stenosis of unspecified carotid artery: Secondary | ICD-10-CM | POA: Insufficient documentation

## 2017-05-29 DIAGNOSIS — Z90710 Acquired absence of both cervix and uterus: Secondary | ICD-10-CM | POA: Insufficient documentation

## 2017-05-29 LAB — BASIC METABOLIC PANEL
Anion gap: 6 (ref 5–15)
BUN: 17 mg/dL (ref 6–20)
CO2: 29 mmol/L (ref 22–32)
CREATININE: 1.04 mg/dL — AB (ref 0.44–1.00)
Calcium: 9.1 mg/dL (ref 8.9–10.3)
Chloride: 102 mmol/L (ref 101–111)
GFR, EST AFRICAN AMERICAN: 54 mL/min — AB (ref >60)
GFR, EST NON AFRICAN AMERICAN: 47 mL/min — AB (ref >60)
Glucose, Bld: 94 mg/dL (ref 65–99)
Potassium: 3.9 mmol/L (ref 3.5–5.1)
SODIUM: 137 mmol/L (ref 135–145)

## 2017-05-29 NOTE — Progress Notes (Signed)
Patient ID: Julie Swanson, female   DOB: 1928/05/27, 83 y.o.   MRN: [id number]     Advanced Heart Failure Clinic Note   PCP: Dr. Lilyan Punt Cardiology: Dr. Rockey Situ Julie Swanson is a 83 y.o. female  for followup of paroxysmal atrial fibrillation and diastolic CHF.  She had been having runs of rapid tachypalpitations when seen her initially.  She was quite symptomatic with episodes.  Event monitor showed runs of atrial fibrillation with RVR.  Started her on Multaq for rhythm control given her significant symptoms and on apixaban.  She did not tolerate diltiazem due to ankle swelling and was switched over to Coreg for rate control.  She did not tolerate Coreg and is back on propranolol, which she as done ok with.  Echo in 11/14 showed EF 60-65% with mild MR. No exertional chest pain or dyspnea. No lightheadedness.  She went into atrial fibrillation again in 8/16.  She had DCCV in 9/16 back to NSR and was continued on Multaq.  However, she went back into atrial fibrillation shortly after on despite Multaq use.  Multaq was stopped.  Given ongoing fatigue with atrial fibrillation, she was started on amiodarone.  DCCV was done in 12/16, she went back into NSR.  Propranolol was stopped due to bradycardia.  Unfortunately, atrial fibrillation has recurred.  She saw Tereso Newcomer and it was decided to try to reload her on amiodarone and attempt DCCV one more time.  Later in 12/16, she was cardioverted her again while on amiodarone.  However, at 09/10/15 appt in atrial fibrillation clinic, she was back in atrial fibrillation.  She developed a tremor on amiodarone.  Amiodarone was stopped with plan to rate control/anticoagulate going forward.  She is now on Toprol XL and diltiazem CD.  Pt presents today for regular follow up. Overall she feels about the same. Walks for about 10-12 minutes daily, weather permitting. Primary complaint is leg fatigue. Denies SOB. She gets very fatigued walking up inclines still. Denies  CP or palpitations. Denies peripheral edema, lightheadedness, or dizziness.   EKG today shows Afib 85 bpm, QTc 461  Labs (10/14): LDL 90, HDL 41, K 4.1, creatinine 0.9 Labs (4/15): LDL 74, HDL 40, K 4.1, creatinine 3.23 Labs (10/15): LDL 73, HDL 38 Labs (12/15): K 4, creatinine 0.9, HCT 36 Labs (8/16): K 4, creatinine 0.97, HCT 37.8, TSH normal Labs (9/16): K 4.6, creatinine 1.06, LDL 68, HDL 47 Labs (11/16): TSH normal, HCT 34.1, LFTs normal Labs (12/16): creatinine 0.96, HCT 35.9 Labs (5/17): K 4.1, creatinine 0.99, LDL 77, HDL 38 Labs (2/18): K 4, creatinine 1.05 Labs (4/18): BNP 321 Labs (5/18): K 3.9, creatinine 1.06  PMH: 1. LHC (8/10) with minimal nonobstructive disease.  ETT (10/13) with 2'53" exercise, nonspecific ECG changes with no evidence for ischemia.  2. Mitral valve prolapse: Echo (11/14) with EF 60-65%, mild MR.  3. Atrial fibrillation:  Event monitor (10/13) with PACs only.  Event monitor (11/14) showed atrial fibrillation with RVR. She does not tolerate metoprolol (nightmares).  She had ankle swelling with diltiazem. Possible fatigue/diarrhea with Coreg. Holter (8/16) with persistent atrial fibrillation.  DCCV to NSR in 9/16.  Back in atrial fibrillation by 10/16 despite Multaq. DCCV to NSR x 2 in 12/16 on amiodarone, but atrial fibrillation recurred.  Amiodarone stopped. 4. HTN 5. Hyperlipidemia 6. Hysterectomy. 7. Carotid disease: Carotid dopplers (1/15) with prominent plaque, mild stenosis.  8. Internal hemorrhoids with episodic rectal bleeding.  9. Macular degeneration.  10. PFTs:  Mild obstructive lung disease.  11. PAD: ABIs (2/17) were normal, but there was evidence for "developing fem-pop disease."  ABIs normal in 2/18.  12. Pancreatic cysts on CT.   SH: Widow, lives in [place], nonsmoker.  2 daughters.   FH: Mother with CVA, CAD.  Father with CAD.   Review of systems complete and found to be negative unless listed in HPI.    Current Outpatient  Prescriptions  Medication Sig Dispense Refill  . apixaban (ELIQUIS) 5 MG TABS tablet Take 1 tablet (5 mg total) by mouth 2 (two) times daily. 180 tablet 3  . atorvastatin (LIPITOR) 10 MG tablet Take 1 tablet (10 mg total) by mouth daily. 90 tablet 3  . cycloSPORINE (RESTASIS) 0.05 % ophthalmic emulsion Place 1 drop into both eyes daily.     Marland Kitchen diltiazem (CARDIZEM CD) 240 MG 24 hr capsule Take 1 capsule (240 mg total) by mouth daily. 90 capsule 6  . furosemide (LASIX) 40 MG tablet Take 40 mg (1 tab) in am and 20 mg (1/2 tab) in pm 135 tablet 6  . hydrocortisone-pramoxine (PROCTOFOAM-HC) rectal foam Place 1 applicator rectally 2 (two) times daily as needed for hemorrhoids or itching.    . Lutein 20 MG CAPS Take 1 capsule by mouth daily.     . metoprolol succinate (TOPROL-XL) 50 MG 24 hr tablet Take 1 tablet (50 mg total) by mouth 2 (two) times daily. 180 tablet 6  . Multiple Vitamins-Minerals (ICAPS) CAPS Take 2 capsules by mouth daily.    Marland Kitchen OVER THE COUNTER MEDICATION Med Name: BENE-FIBER Take two (2) teaspoons by mouth daily with 8 oz water.    . potassium chloride SA (K-DUR,KLOR-CON) 20 MEQ tablet Take 1 tablet (20 mEq total) by mouth daily. 90 tablet 6  . triamcinolone cream (KENALOG) 0.1 % Apply 1 application topically 2 (two) times daily as needed (neck).    Marland Kitchen acetaminophen (TYLENOL) 500 MG tablet Take 500 mg by mouth 2 (two) times daily as needed for moderate pain.     No current facility-administered medications for this encounter.    Vitals:   05/29/17 0959  BP: 126/67  Pulse: 86  SpO2: 100%  Weight: 152 lb 8 oz (69.2 kg)   Wt Readings from Last 3 Encounters:  05/29/17 152 lb 8 oz (69.2 kg)  03/26/17 152 lb (68.9 kg)  02/08/17 154 lb 8 oz (70.1 kg)   General: NAD.  HEENT: Normal Neck: Supple. JVP 6-7 cm. Carotids 2+ bilat; no bruits. No thyromegaly or nodule noted. Cor: PMI nondisplaced. RRR, No M/G/R noted Lungs: CTAB, normal effort. Abdomen: Soft, non-tender,  non-distended, no HSM. No bruits or masses. +BS  Extremities: No cyanosis, clubbing, or rash. Trace ankle edema.   Neuro: Alert & orientedx3, cranial nerves grossly intact. moves all 4 extremities w/o difficulty. Affect pleasant   Assessment/Plan: 1. Atrial fibrillation:  Persistent, has recurred after DCCV in 12/16 on amiodarone.  She has failed amiodarone and Multaq.  She is not a good sotalol or Tikosyn candidate as QTc has been prolonged.  - Have opted for rate control and anticoagulation strategy. Less fatigued since decreasing Toprol XL and increasing diltiazem CD.  - Continue Toprol XL 50 mg bid and diltiazem CD 240 mg daily.   Would not decrease Toprol XL further as HR is mildly elevated at times.  Would hold off on increasing diltiazem CD with peripheral edema.  - Continue Eliquis. 2. HTN:  - Stable on current meds.    3. Hyperlipidemia:  -  Stable at last visit.    4. Carotid bruit: Mild stenosis only. No change. 5. Leg fatigue: ABIs normal in 2/18.   6. Chronic diastolic CHF:  - Volume status looks stable on exam. Peripheral edema likely related to diltiazem, no JVD.   - Continue Lasix 40 qam/20 qpm. BMET today.  - Continue compression stockings.   No change to meds today. RTC 4-6 or sooner with symptoms. Labs today.   Graciella Freer, PA-C  05/29/2017

## 2017-05-29 NOTE — Patient Instructions (Signed)
Labs drawn today ( if we do not call you your labs were stable)   Your physician recommends that you schedule a follow-up appointment in: 4-6 months with Dr. Shirlee Latch

## 2017-08-08 ENCOUNTER — Encounter: Payer: Self-pay | Admitting: Family Medicine

## 2017-08-08 ENCOUNTER — Ambulatory Visit (INDEPENDENT_AMBULATORY_CARE_PROVIDER_SITE_OTHER): Payer: Medicare Other | Admitting: Family Medicine

## 2017-08-08 VITALS — BP 130/82 | Ht 64.0 in | Wt 154.4 lb

## 2017-08-08 DIAGNOSIS — I482 Chronic atrial fibrillation, unspecified: Secondary | ICD-10-CM

## 2017-08-08 DIAGNOSIS — I10 Essential (primary) hypertension: Secondary | ICD-10-CM

## 2017-08-08 NOTE — Progress Notes (Signed)
   Subjective:    Patient ID: Julie Swanson, female    DOB: [birth year], 83 y.o.   MRN: [id number]  Hypertension  This is a chronic problem. The current episode started more than 1 year ago. Pertinent negatives include no chest pain, headaches or shortness of breath. Risk factors for coronary artery disease include dyslipidemia and post-menopausal state. Treatments tried: cardizem, lasix, metoprol, hctz. There are no compliance problems.    Not much energy- was told related to chronic a fib She denies any falls denies any depression  Review of Systems  Constitutional: Negative for activity change, fatigue and fever.  HENT: Negative for congestion.   Respiratory: Negative for cough, chest tightness and shortness of breath.   Cardiovascular: Negative for chest pain and leg swelling.  Gastrointestinal: Negative for abdominal pain.  Skin: Negative for color change.  Neurological: Negative for headaches.  Psychiatric/Behavioral: Negative for behavioral problems.       Objective:   Physical Exam  Constitutional: She appears well-developed and well-nourished. No distress.  HENT:  Head: Normocephalic and atraumatic.  Eyes: Right eye exhibits no discharge. Left eye exhibits no discharge.  Neck: No tracheal deviation present.  Cardiovascular: Normal rate and normal heart sounds.  No murmur heard. Pulmonary/Chest: Effort normal and breath sounds normal. No respiratory distress. She has no wheezes. She has no rales.  Musculoskeletal: She exhibits no edema.  Lymphadenopathy:    She has no cervical adenopathy.  Neurological: She is alert. She exhibits normal muscle tone.  Skin: Skin is warm and dry. No erythema.  Psychiatric: Her behavior is normal.  Vitals reviewed.  She does have atrial fib with a heart rate is controlled no sign of CHF currently       Assessment & Plan:  No new labs necessary Medications were reviewed Continue current treatment force atrial fib If ongoing  troubles or if worse follow-up Otherwise recheck in the spring Lab work at that time Patient has decided not to do mammogram until it has been 1 year since her previous one.  She feels that the area on her left breast is benign and has not changed

## 2017-08-24 ENCOUNTER — Other Ambulatory Visit: Payer: Self-pay | Admitting: Family Medicine

## 2017-08-24 DIAGNOSIS — N6489 Other specified disorders of breast: Secondary | ICD-10-CM

## 2017-10-12 ENCOUNTER — Ambulatory Visit
Admission: RE | Admit: 2017-10-12 | Discharge: 2017-10-12 | Disposition: A | Discharge status: Home / Self Care | Payer: Medicare Other | Source: Ambulatory Visit | Attending: Family Medicine | Admitting: Family Medicine

## 2017-10-12 ENCOUNTER — Other Ambulatory Visit: Payer: Self-pay | Admitting: Family Medicine

## 2017-10-12 DIAGNOSIS — N6489 Other specified disorders of breast: Secondary | ICD-10-CM

## 2017-10-30 ENCOUNTER — Encounter (HOSPITAL_COMMUNITY): Payer: Self-pay | Admitting: Cardiology

## 2017-10-30 ENCOUNTER — Ambulatory Visit (HOSPITAL_COMMUNITY)
Admission: RE | Admit: 2017-10-30 | Discharge: 2017-10-30 | Disposition: A | Discharge status: Home / Self Care | Payer: Medicare Other | Source: Ambulatory Visit | Attending: Cardiology | Admitting: Cardiology

## 2017-10-30 VITALS — BP 122/80 | HR 96 | Wt 150.8 lb

## 2017-10-30 DIAGNOSIS — Z7901 Long term (current) use of anticoagulants: Secondary | ICD-10-CM | POA: Diagnosis not present

## 2017-10-30 DIAGNOSIS — I482 Chronic atrial fibrillation: Secondary | ICD-10-CM | POA: Diagnosis present

## 2017-10-30 DIAGNOSIS — Z90710 Acquired absence of both cervix and uterus: Secondary | ICD-10-CM | POA: Diagnosis not present

## 2017-10-30 DIAGNOSIS — Z8249 Family history of ischemic heart disease and other diseases of the circulatory system: Secondary | ICD-10-CM | POA: Insufficient documentation

## 2017-10-30 DIAGNOSIS — I4891 Unspecified atrial fibrillation: Secondary | ICD-10-CM | POA: Diagnosis not present

## 2017-10-30 DIAGNOSIS — I341 Nonrheumatic mitral (valve) prolapse: Secondary | ICD-10-CM | POA: Insufficient documentation

## 2017-10-30 DIAGNOSIS — I5032 Chronic diastolic (congestive) heart failure: Secondary | ICD-10-CM | POA: Diagnosis not present

## 2017-10-30 DIAGNOSIS — H3530 Unspecified macular degeneration: Secondary | ICD-10-CM | POA: Diagnosis not present

## 2017-10-30 DIAGNOSIS — I481 Persistent atrial fibrillation: Secondary | ICD-10-CM | POA: Insufficient documentation

## 2017-10-30 DIAGNOSIS — E785 Hyperlipidemia, unspecified: Secondary | ICD-10-CM | POA: Insufficient documentation

## 2017-10-30 DIAGNOSIS — J449 Chronic obstructive pulmonary disease, unspecified: Secondary | ICD-10-CM | POA: Diagnosis not present

## 2017-10-30 DIAGNOSIS — I10 Essential (primary) hypertension: Secondary | ICD-10-CM | POA: Diagnosis not present

## 2017-10-30 DIAGNOSIS — Z79899 Other long term (current) drug therapy: Secondary | ICD-10-CM | POA: Insufficient documentation

## 2017-10-30 DIAGNOSIS — I110 Hypertensive heart disease with heart failure: Secondary | ICD-10-CM | POA: Diagnosis not present

## 2017-10-30 DIAGNOSIS — Z8719 Personal history of other diseases of the digestive system: Secondary | ICD-10-CM | POA: Diagnosis not present

## 2017-10-30 LAB — CBC
HEMATOCRIT: 38.7 % (ref 36.0–46.0)
Hemoglobin: 12.9 g/dL (ref 12.0–15.0)
MCH: 30.4 pg (ref 26.0–34.0)
MCHC: 33.3 g/dL (ref 30.0–36.0)
MCV: 91.3 fL (ref 78.0–100.0)
PLATELETS: 234 10*3/uL (ref 150–400)
RBC: 4.24 MIL/uL (ref 3.87–5.11)
RDW: 13.3 % (ref 11.5–15.5)
WBC: 6.4 10*3/uL (ref 4.0–10.5)

## 2017-10-30 LAB — BASIC METABOLIC PANEL
ANION GAP: 14 (ref 5–15)
BUN: 12 mg/dL (ref 6–20)
CHLORIDE: 99 mmol/L — AB (ref 101–111)
CO2: 26 mmol/L (ref 22–32)
CREATININE: 0.95 mg/dL (ref 0.44–1.00)
Calcium: 9.2 mg/dL (ref 8.9–10.3)
GFR calc non Af Amer: 52 mL/min — ABNORMAL LOW (ref >60)
Glucose, Bld: 103 mg/dL — ABNORMAL HIGH (ref 65–99)
POTASSIUM: 3.9 mmol/L (ref 3.5–5.1)
SODIUM: 139 mmol/L (ref 135–145)

## 2017-10-30 NOTE — Progress Notes (Signed)
SATURATION QUALIFICATIONS: (This note is used to comply with regulatory documentation for home oxygen)  Patient Saturations on Room Air at Rest = 96%   Please briefly explain why patient needs home oxygen: Pt wants to rent oxygen for travel to high altitude area.

## 2017-10-30 NOTE — Progress Notes (Signed)
Medication Samples have been provided to the patient.  Drug name: Eliquis      Strength: 5 mg     Qty: 2  LOT: [id number]  Exp.Date: 3/21  Dosing instructions: Take 1 Tablet Twice Daily  The patient has been instructed regarding the correct time, dose, and frequency of taking this medication, including desired effects and most common side effects.   Georgina PeerFarver, Antoinett Dorman S 12:01 PM 10/30/2017

## 2017-10-30 NOTE — Patient Instructions (Signed)
Labs drawn today (if we do not call you, then your lab work was stable)   Your physician has requested that you have an echocardiogram. Echocardiography is a painless test that uses sound waves to create images of your heart. It provides your doctor with information about the size and shape of your heart and how well your heart's chambers and valves are working. This procedure takes approximately one hour. There are no restrictions for this procedure.  [facility] they will call [facility]  Your physician recommends that you schedule a follow-up appointment in: 3 months with Dr. Shirlee LatchMcLean

## 2017-10-30 NOTE — Progress Notes (Signed)
Patient ID: Julie SageNancy S Swanson, female   DOB: 01-19-1928, 84 y.o.   MRN: [id number] PCP: Dr. Lilyan PuntScott Luking Cardiology: Dr. Shirlee LatchMcLean  84 yo presents for followup of paroxysmal atrial fibrillation and diastolic CHF.  She had been having runs of rapid tachypalpitations when I saw her initially.  She was quite symptomatic with episodes.  Event monitor showed runs of atrial fibrillation with RVR.  I started her on Multaq for rhythm control given her significant symptoms and on apixaban.  She did not tolerate diltiazem due to ankle swelling and was switched over to Coreg for rate control.  She did not tolerate Coreg and is back on propranolol, which she does ok with.  Echo in 11/14 showed EF 60-65% with mild MR. No exertional chest pain or dyspnea. No lightheadedness.  She went into atrial fibrillation again in 8/16.  She had DCCV in 9/16 back to NSR and was continued on Multaq.  However, she went back into atrial fibrillation shortly after on despite Multaq use.  Multaq was stopped.  Given ongoing fatigue with atrial fibrillation, she was started on amiodarone.  DCCV was done in 12/16, she went back into NSR.  Propranolol was stopped due to bradycardia.  Unfortunately, atrial fibrillation has recurred.  She saw [name] and it was decided to try to reload her on amiodarone and attempt DCCV one more time.  Later in 12/16, I cardioverted her again while on amiodarone.  However, at 09/10/15 appt in atrial fibrillation clinic, she was back in atrial fibrillation.  She developed a tremor on amiodarone.  Amiodarone was stopped with plan to rate control/anticoagulate going forward.  She is now on Toprol XL and diltiazem CD.  She remains in atrial fibrillation.  HR 90s today.  She has had hemorrhoidal banding and no longer has episodes of BRBPR.  She tries to walk for 10 minutes twice a day. No dyspnea walking on flat ground.  She is short of breath walking up an incline or stairs.  No falls.  No chest pain.  No  lightheadedness, no palpitations. Weight is down 2 lbs.  She wants to go to a [place] in [place].    Labs (10/14): LDL 90, HDL 41, K 4.1, creatinine 0.9 Labs (4/15): LDL 74, HDL 40, K 4.1, creatinine 5.280.87 Labs (10/15): LDL 73, HDL 38 Labs (12/15): K 4, creatinine 0.9, HCT 36 Labs (8/16): K 4, creatinine 0.97, HCT 37.8, TSH normal Labs (9/16): K 4.6, creatinine 1.06, LDL 68, HDL 47 Labs (11/16): TSH normal, HCT 34.1, LFTs normal Labs (12/16): creatinine 0.96, HCT 35.9 Labs (5/17): K 4.1, creatinine 0.99, LDL 77, HDL 38 Labs (2/18): K 4, creatinine 1.05 Labs (4/18): BNP 321 Labs (5/18): K 3.9, creatinine 1.06, LDL 62 Labs (9/18): K 3.9, creatinine 1.04  PMH: 1. LHC (8/10) with minimal nonobstructive disease.  ETT (10/13) with 2'53" exercise, nonspecific ECG changes with no evidence for ischemia.  2. Mitral valve prolapse: Echo (11/14) with EF 60-65%, mild MR.  3. Atrial fibrillation:  Event monitor (10/13) with PACs only.  Event monitor (11/14) showed atrial fibrillation with RVR. She does not tolerate metoprolol (nightmares).  She had ankle swelling with diltiazem. Possible fatigue/diarrhea with Coreg. Holter (8/16) with persistent atrial fibrillation.  DCCV to NSR in 9/16.  Back in atrial fibrillation by 10/16 despite Multaq. DCCV to NSR x 2 in 12/16 on amiodarone, but atrial fibrillation recurred.  Amiodarone stopped. 4. HTN 5. Hyperlipidemia 6. Hysterectomy. 7. Carotid disease: Carotid dopplers (1/15) with prominent plaque, mild stenosis.  8. Internal hemorrhoids with episodic rectal bleeding.  9. Macular degeneration.  10. PFTs: Mild obstructive lung disease.  11. PAD: ABIs (2/17) were normal, but there was evidence for "developing fem-pop disease."  ABIs normal in 2/18.  12. Pancreatic cysts on CT.  13. Chronic diastolic CHF  SH: Widow, lives in [place], nonsmoker.  2 daughters.   FH: Mother with CVA, CAD.  Father with CAD.   ROS: All systems reviewed and negative except as  per HPI.   Current Outpatient Medications  Medication Sig Dispense Refill  . acetaminophen (TYLENOL) 500 MG tablet Take 500 mg by mouth 2 (two) times daily as needed for moderate pain.    Marland Kitchen apixaban (ELIQUIS) 5 MG TABS tablet Take 1 tablet (5 mg total) by mouth 2 (two) times daily. 180 tablet 3  . atorvastatin (LIPITOR) 10 MG tablet Take 1 tablet (10 mg total) by mouth daily. 90 tablet 3  . cycloSPORINE (RESTASIS) 0.05 % ophthalmic emulsion Place 1 drop into both eyes daily.     Marland Kitchen diltiazem (CARDIZEM CD) 240 MG 24 hr capsule Take 1 capsule (240 mg total) by mouth daily. 90 capsule 6  . furosemide (LASIX) 40 MG tablet Take 40 mg (1 tab) in am and 20 mg (1/2 tab) in pm 135 tablet 6  . hydrocortisone-pramoxine (PROCTOFOAM-HC) rectal foam Place 1 applicator rectally 2 (two) times daily as needed for hemorrhoids or itching.    . Lutein 20 MG CAPS Take 1 capsule by mouth daily.     . metoprolol succinate (TOPROL-XL) 50 MG 24 hr tablet Take 1 tablet (50 mg total) by mouth 2 (two) times daily. 180 tablet 6  . Multiple Vitamins-Minerals (ICAPS) CAPS Take 2 capsules by mouth daily.    Marland Kitchen OVER THE COUNTER MEDICATION Med Name: BENE-FIBER Take two (2) teaspoons by mouth daily with 8 oz water.    . potassium chloride SA (K-DUR,KLOR-CON) 20 MEQ tablet Take 1 tablet (20 mEq total) by mouth daily. 90 tablet 6  . triamcinolone cream (KENALOG) 0.1 % Apply 1 application topically 2 (two) times daily as needed (neck).     No current facility-administered medications for this encounter.     BP 122/80   Pulse 96   Wt 150 lb 12.8 oz (68.4 kg)   SpO2 96%   BMI 25.88 kg/m  General: NAD Neck: No JVD, no thyromegaly or thyroid nodule.  Lungs: Clear to auscultation bilaterally with normal respiratory effort. CV: Nondisplaced PMI.  Heart irregular S1/S2, no S3/S4, no murmur.  1+ edema to knees bilaterally.  No carotid bruit.  Normal pedal pulses.  Abdomen: Soft, nontender, no hepatosplenomegaly, no distention.   Skin: Intact without lesions or rashes.  Neurologic: Alert and oriented x 3.  Psych: Normal affect. Extremities: No clubbing or cyanosis.  HEENT: Normal.   Assessment/Plan: 1. Atrial fibrillation:  Chronic, has recurred after DCCV in 12/16 on amiodarone.  She has failed amiodarone and Multaq.  She is not a good sotalol or Tikosyn candidate as QTc has been prolonged.  We have decided on following a rate control and anticoagulation strategy. HR is controlled. - Continue Toprol XL 50 mg bid and diltiazem CD 240 mg daily.    - Continue Eliquis.  CBC today.  2. HTN: BP not elevated.   3. Hyperlipidemia: Good lipids in 5/18.    4. Carotid bruit: Mild stenosis only.  5. Leg fatigue: ABIs normal in 2/18.   6. Chronic diastolic CHF: She does not look volume overloaded. Peripheral edema likely  related to diltiazem, no JVD.   - Continue Lasix 40 qam/20 qpm. BMET today.   - She needs to wear compression stockings during the day.  - I would like her to get an echocardiogram, will arrange.   Her family and I discussed proprosed trip to [place].  I think she will be more short of breath at the high altitude.  If she goes, she will need to have a wheelchair available, and I would also like her to take portable oxygen for use at the high altitude if she is significantly dyspneic.    Followup in 3 months    Marca Ancona 10/30/2017

## 2017-11-02 ENCOUNTER — Ambulatory Visit (HOSPITAL_COMMUNITY)
Admission: RE | Admit: 2017-11-02 | Discharge: 2017-11-02 | Disposition: A | Discharge status: Home / Self Care | Payer: Medicare Other | Source: Ambulatory Visit | Attending: Cardiology | Admitting: Cardiology

## 2017-11-02 DIAGNOSIS — I517 Cardiomegaly: Secondary | ICD-10-CM | POA: Diagnosis not present

## 2017-11-02 DIAGNOSIS — E785 Hyperlipidemia, unspecified: Secondary | ICD-10-CM | POA: Insufficient documentation

## 2017-11-02 DIAGNOSIS — I110 Hypertensive heart disease with heart failure: Secondary | ICD-10-CM | POA: Diagnosis not present

## 2017-11-02 DIAGNOSIS — I5032 Chronic diastolic (congestive) heart failure: Secondary | ICD-10-CM | POA: Diagnosis present

## 2017-11-02 DIAGNOSIS — I081 Rheumatic disorders of both mitral and tricuspid valves: Secondary | ICD-10-CM | POA: Diagnosis not present

## 2017-11-02 DIAGNOSIS — R7303 Prediabetes: Secondary | ICD-10-CM | POA: Diagnosis not present

## 2017-11-02 DIAGNOSIS — I4891 Unspecified atrial fibrillation: Secondary | ICD-10-CM | POA: Diagnosis not present

## 2017-11-02 DIAGNOSIS — J449 Chronic obstructive pulmonary disease, unspecified: Secondary | ICD-10-CM | POA: Insufficient documentation

## 2017-11-02 LAB — ECHOCARDIOGRAM COMPLETE
CHL CUP MV DEC (S): 151
CHL CUP RV SYS PRESS: 37 mmHg
CHL CUP STROKE VOLUME: 21 mL
E decel time: 151 msec
FS: 37 % (ref 28–44)
IVS/LV PW RATIO, ED: 1.07
LA ID, A-P, ES: 44 mm
LA diam index: 2.48 cm/m2
LA vol A4C: 80.2 ml
LAVOL: 80 mL
LAVOLIN: 45.2 mL/m2
LDCA: 2.54 cm2
LEFT ATRIUM END SYS DIAM: 44 mm
LV dias vol index: 21 mL/m2
LV dias vol: 37 mL — AB (ref 46–106)
LV sys vol index: 9 mL/m2
LV sys vol: 16 mL (ref 14–42)
LVOT VTI: 13.6 cm
LVOT diameter: 18 mm
LVOT peak grad rest: 2 mmHg
LVOTPV: 62.8 cm/s
LVOTSV: 35 mL
MV Peak grad: 5 mmHg
MVPKEVEL: 117 m/s
PW: 10.2 mm — AB (ref 0.6–1.1)
RV TAPSE: 10.6 mm
Reg peak vel: 292 cm/s
Simpson's disk: 58
TR max vel: 292 cm/s

## 2017-11-02 NOTE — Progress Notes (Signed)
*  PRELIMINARY RESULTS* Echocardiogram 2D Echocardiogram has been performed.  Stacey DrainWhite, Eyva Califano J 11/02/2017, 10:33 AM

## 2017-12-12 ENCOUNTER — Encounter: Payer: Self-pay | Admitting: Family Medicine

## 2017-12-12 ENCOUNTER — Ambulatory Visit (INDEPENDENT_AMBULATORY_CARE_PROVIDER_SITE_OTHER): Payer: Medicare Other | Admitting: Family Medicine

## 2017-12-12 VITALS — BP 130/88 | Ht 64.0 in | Wt 150.0 lb

## 2017-12-12 DIAGNOSIS — E7800 Pure hypercholesterolemia, unspecified: Secondary | ICD-10-CM

## 2017-12-12 DIAGNOSIS — Z79899 Other long term (current) drug therapy: Secondary | ICD-10-CM

## 2017-12-12 NOTE — Progress Notes (Signed)
   Subjective:    Patient ID: Julie Swanson, female    DOB: October 22, 1927, 84 y.o.   MRN: [id number]  HPI Patient here for follow-up regarding cholesterol.  Patient does try to maintain a reasonable diet.  Patient does take the medication on a regular basis.  Denies missing a dose.  The patient denies any obvious side effects.  Prior blood work results reviewed with the patient.  The patient is aware of his cholesterol goals and the need to keep it under good control to lessen the risk of disease.  Patient for blood pressure check up. Patient relates compliance with meds. Todays BP reviewed with the patient. Patient denies issues with medication. Patient relates reasonable diet. Patient tries to minimize salt. Patient aware of BP goals.  Patient is here today to follow up on Htn.  She states she eats healthy and gets some exercise walking when weather permits.She see Dr. Lucien MonsMclain Cardiologist. Review of Systems  Constitutional: Negative for activity change, fatigue and fever.  HENT: Negative for congestion.   Respiratory: Negative for cough, chest tightness and shortness of breath.   Cardiovascular: Negative for chest pain and leg swelling.  Gastrointestinal: Negative for abdominal pain.  Skin: Negative for color change.  Neurological: Negative for headaches.  Psychiatric/Behavioral: Negative for behavioral problems.       Objective:   Physical Exam  Constitutional: She appears well-developed and well-nourished. No distress.  HENT:  Head: Normocephalic and atraumatic.  Eyes: Right eye exhibits no discharge. Left eye exhibits no discharge.  Neck: No tracheal deviation present.  Cardiovascular: Normal rate, regular rhythm and normal heart sounds.  No murmur heard. Pulmonary/Chest: Effort normal and breath sounds normal. No respiratory distress. She has no wheezes. She has no rales.  Musculoskeletal: She exhibits no edema.  Lymphadenopathy:    She has no cervical adenopathy.    Neurological: She is alert. She exhibits normal muscle tone.  Skin: Skin is warm and dry. No erythema.  Psychiatric: Her behavior is normal.  Vitals reviewed.         Assessment & Plan:  Patient not having any bleeding complications Cholesterol under good control previous labs reviewed new labs ordered continue medication Heart condition followed by cardiology blood pressure good today Encourage patient to do walking 15-20 minutes within her home or in a safe area with minimal risk of falling recommend doing this anywhere from 4-5 days/week  Recommend patient to follow-up in September  Patient will be seeing possibility of going to [place] for a wedding in October she states her cardiologist will be putting her on oxygen temporarily for this

## 2017-12-13 ENCOUNTER — Other Ambulatory Visit (HOSPITAL_COMMUNITY): Payer: Self-pay | Admitting: Cardiology

## 2017-12-14 ENCOUNTER — Other Ambulatory Visit (HOSPITAL_COMMUNITY): Payer: Self-pay

## 2017-12-14 MED ORDER — ATORVASTATIN CALCIUM 10 MG PO TABS: 10.0000 mg | ORAL_TABLET | Freq: Every day | ORAL | 3 refills | Status: DC

## 2017-12-14 MED ORDER — APIXABAN 5 MG PO TABS: 5.0000 mg | ORAL_TABLET | Freq: Two times a day (BID) | ORAL | 3 refills | Status: DC

## 2018-01-09 ENCOUNTER — Encounter: Payer: Self-pay | Admitting: Family Medicine

## 2018-01-09 LAB — HEPATIC FUNCTION PANEL
ALBUMIN: 4.1 g/dL (ref 3.5–4.7)
ALT: 11 IU/L (ref 0–32)
AST: 16 IU/L (ref 0–40)
Alkaline Phosphatase: 109 IU/L (ref 39–117)
Bilirubin Total: 0.7 mg/dL (ref 0.0–1.2)
Bilirubin, Direct: 0.19 mg/dL (ref 0.00–0.40)
TOTAL PROTEIN: 6.6 g/dL (ref 6.0–8.5)

## 2018-01-09 LAB — LIPID PANEL
CHOLESTEROL TOTAL: 134 mg/dL (ref 100–199)
Chol/HDL Ratio: 3.5 ratio (ref 0.0–4.4)
HDL: 38 mg/dL — ABNORMAL LOW (ref >39)
LDL Calculated: 69 mg/dL (ref 0–99)
Triglycerides: 133 mg/dL (ref 0–149)
VLDL CHOLESTEROL CAL: 27 mg/dL (ref 5–40)

## 2018-01-29 ENCOUNTER — Encounter (HOSPITAL_COMMUNITY): Payer: Self-pay | Admitting: Cardiology

## 2018-01-29 ENCOUNTER — Ambulatory Visit (HOSPITAL_COMMUNITY)
Admission: RE | Admit: 2018-01-29 | Discharge: 2018-01-29 | Disposition: A | Discharge status: Home / Self Care | Payer: Medicare Other | Source: Ambulatory Visit | Attending: Cardiology | Admitting: Cardiology

## 2018-01-29 ENCOUNTER — Other Ambulatory Visit: Payer: Self-pay

## 2018-01-29 VITALS — BP 129/69 | HR 87 | Wt 148.5 lb

## 2018-01-29 DIAGNOSIS — Z90710 Acquired absence of both cervix and uterus: Secondary | ICD-10-CM | POA: Insufficient documentation

## 2018-01-29 DIAGNOSIS — I481 Persistent atrial fibrillation: Secondary | ICD-10-CM | POA: Diagnosis not present

## 2018-01-29 DIAGNOSIS — I110 Hypertensive heart disease with heart failure: Secondary | ICD-10-CM | POA: Insufficient documentation

## 2018-01-29 DIAGNOSIS — Z8249 Family history of ischemic heart disease and other diseases of the circulatory system: Secondary | ICD-10-CM | POA: Diagnosis not present

## 2018-01-29 DIAGNOSIS — J449 Chronic obstructive pulmonary disease, unspecified: Secondary | ICD-10-CM | POA: Diagnosis not present

## 2018-01-29 DIAGNOSIS — Z79899 Other long term (current) drug therapy: Secondary | ICD-10-CM | POA: Insufficient documentation

## 2018-01-29 DIAGNOSIS — I5032 Chronic diastolic (congestive) heart failure: Secondary | ICD-10-CM | POA: Diagnosis not present

## 2018-01-29 DIAGNOSIS — I482 Chronic atrial fibrillation: Secondary | ICD-10-CM | POA: Diagnosis present

## 2018-01-29 DIAGNOSIS — I4891 Unspecified atrial fibrillation: Secondary | ICD-10-CM

## 2018-01-29 DIAGNOSIS — R0989 Other specified symptoms and signs involving the circulatory and respiratory systems: Secondary | ICD-10-CM | POA: Diagnosis not present

## 2018-01-29 DIAGNOSIS — Z09 Encounter for follow-up examination after completed treatment for conditions other than malignant neoplasm: Secondary | ICD-10-CM | POA: Diagnosis not present

## 2018-01-29 DIAGNOSIS — H3530 Unspecified macular degeneration: Secondary | ICD-10-CM | POA: Insufficient documentation

## 2018-01-29 DIAGNOSIS — I341 Nonrheumatic mitral (valve) prolapse: Secondary | ICD-10-CM | POA: Insufficient documentation

## 2018-01-29 DIAGNOSIS — I10 Essential (primary) hypertension: Secondary | ICD-10-CM | POA: Diagnosis not present

## 2018-01-29 DIAGNOSIS — E785 Hyperlipidemia, unspecified: Secondary | ICD-10-CM | POA: Diagnosis not present

## 2018-01-29 DIAGNOSIS — Z7901 Long term (current) use of anticoagulants: Secondary | ICD-10-CM | POA: Insufficient documentation

## 2018-01-29 LAB — CBC
HEMATOCRIT: 37.7 % (ref 36.0–46.0)
HEMOGLOBIN: 12.6 g/dL (ref 12.0–15.0)
MCH: 30.7 pg (ref 26.0–34.0)
MCHC: 33.4 g/dL (ref 30.0–36.0)
MCV: 91.7 fL (ref 78.0–100.0)
Platelets: 223 10*3/uL (ref 150–400)
RBC: 4.11 MIL/uL (ref 3.87–5.11)
RDW: 13.3 % (ref 11.5–15.5)
WBC: 5.7 10*3/uL (ref 4.0–10.5)

## 2018-01-29 LAB — BASIC METABOLIC PANEL
ANION GAP: 8 (ref 5–15)
BUN: 17 mg/dL (ref 6–20)
CHLORIDE: 103 mmol/L (ref 101–111)
CO2: 29 mmol/L (ref 22–32)
Calcium: 9.2 mg/dL (ref 8.9–10.3)
Creatinine, Ser: 0.98 mg/dL (ref 0.44–1.00)
GFR calc non Af Amer: 50 mL/min — ABNORMAL LOW (ref >60)
GFR, EST AFRICAN AMERICAN: 58 mL/min — AB (ref >60)
GLUCOSE: 101 mg/dL — AB (ref 65–99)
Potassium: 4.1 mmol/L (ref 3.5–5.1)
Sodium: 140 mmol/L (ref 135–145)

## 2018-01-29 NOTE — Patient Instructions (Signed)
Labs drawn today (if we do not call you, then your lab work was stable)   Your physician recommends that you schedule a follow-up appointment in: 4 months with Dr. McLean    

## 2018-01-29 NOTE — Progress Notes (Signed)
Medication Samples have been provided to the patient.  Drug name: Eliquis       Strength: 5 mg        Qty: 2  LOT: [id number]  Exp.Date: 3/21  Dosing instructions: take 1 tab by mouth twice a day  The patient has been instructed regarding the correct time, dose, and frequency of taking this medication, including desired effects and most common side effects.   Teresa Coombs 11:32 AM 01/29/2018

## 2018-01-30 NOTE — Progress Notes (Signed)
Patient ID: Julie Swanson, female   DOB: 06-Aug-1928, 84 y.o.   MRN: [id number] PCP: Dr. Lilyan Punt Cardiology: Dr. Shirlee Latch  84 y.o. presents for followup of paroxysmal atrial fibrillation and diastolic CHF.  She had been having runs of rapid tachypalpitations when I saw her initially.  She was quite symptomatic with episodes.  Event monitor showed runs of atrial fibrillation with RVR.  I started her on Multaq for rhythm control given her significant symptoms and on apixaban.  She did not tolerate diltiazem due to ankle swelling and was switched over to Coreg for rate control.  She did not tolerate Coreg and is back on propranolol, which she does ok with.  Echo in 11/14 showed EF 60-65% with mild MR. No exertional chest pain or dyspnea. No lightheadedness.  She went into atrial fibrillation again in 8/16.  She had DCCV in 9/16 back to NSR and was continued on Multaq.  However, she went back into atrial fibrillation shortly after on despite Multaq use.  Multaq was stopped.  Given ongoing fatigue with atrial fibrillation, she was started on amiodarone.  DCCV was done in 12/16, she went back into NSR.  Propranolol was stopped due to bradycardia.  Unfortunately, atrial fibrillation has recurred.  She saw Tereso Newcomer and it was decided to try to reload her on amiodarone and attempt DCCV one more time.  Later in 12/16, I cardioverted her again while on amiodarone.  However, at 09/10/15 appt in atrial fibrillation clinic, she was back in atrial fibrillation.  She developed a tremor on amiodarone.  Amiodarone was stopped with plan to rate control/anticoagulate going forward.  She is now on Toprol XL and diltiazem CD.  Echo in 2/19 showed EF 60-65%, mild LVH, moderate TR, PASP 37 mmHg.   She remains in atrial fibrillation.  HR 80s today.  She has had hemorrhoidal banding and no longer has episodes of BRBPR.  She is walking more, 10-15 minutes daily. No dyspnea with these walks (on flat ground) but legs will fatigue.   Weight down 2 lbs.  No chest pain.  No lightheadedness or falls.   Labs (10/14): LDL 90, HDL 41, K 4.1, creatinine 0.9 Labs (4/15): LDL 74, HDL 40, K 4.1, creatinine 4.09 Labs (10/15): LDL 73, HDL 38 Labs (12/15): K 4, creatinine 0.9, HCT 36 Labs (8/16): K 4, creatinine 0.97, HCT 37.8, TSH normal Labs (9/16): K 4.6, creatinine 1.06, LDL 68, HDL 47 Labs (11/16): TSH normal, HCT 34.1, LFTs normal Labs (12/16): creatinine 0.96, HCT 35.9 Labs (5/17): K 4.1, creatinine 0.99, LDL 77, HDL 38 Labs (2/18): K 4, creatinine 1.05 Labs (4/18): BNP 321 Labs (5/18): K 3.9, creatinine 1.06, LDL 62 Labs (9/18): K 3.9, creatinine 1.04 Labs (2/19): hgb 12.9, K 3.9, creatinine 0.95 Labs (4/19): LDL 69, HDL 38  PMH: 1. LHC (8/10) with minimal nonobstructive disease.  ETT (10/13) with 2'53" exercise, nonspecific ECG changes with no evidence for ischemia.  2. Mitral valve prolapse: Echo (11/14) with EF 60-65%, mild MR.  3. Atrial fibrillation:  Event monitor (10/13) with PACs only.  Event monitor (11/14) showed atrial fibrillation with RVR. She does not tolerate metoprolol (nightmares).  She had ankle swelling with diltiazem. Possible fatigue/diarrhea with Coreg. Holter (8/16) with persistent atrial fibrillation.  DCCV to NSR in 9/16.  Back in atrial fibrillation by 10/16 despite Multaq. DCCV to NSR x 2 in 12/16 on amiodarone, but atrial fibrillation recurred.  Amiodarone stopped. 4. HTN 5. Hyperlipidemia 6. Hysterectomy. 7. Carotid disease: Carotid dopplers (  1/15) with prominent plaque, mild stenosis.  8. Internal hemorrhoids with episodic rectal bleeding.  9. Macular degeneration.  10. PFTs: Mild obstructive lung disease.  11. PAD: ABIs (2/17) were normal, but there was evidence for "developing fem-pop disease."  ABIs normal in 2/18.  12. Pancreatic cysts on CT.  13. Chronic diastolic CHF - Echo (2/19): EF 60-65%, mild LVH, moderate TR, PASP 37 mmHg.   SH: Widow, lives in [place], nonsmoker.  2  daughters.   FH: Mother with CVA, CAD.  Father with CAD.   ROS: All systems reviewed and negative except as per HPI.   Current Outpatient Medications  Medication Sig Dispense Refill  . acetaminophen (TYLENOL) 500 MG tablet Take 500 mg by mouth 2 (two) times daily as needed for moderate pain.    Marland Kitchen apixaban (ELIQUIS) 5 MG TABS tablet Take 1 tablet (5 mg total) by mouth 2 (two) times daily. 180 tablet 3  . atorvastatin (LIPITOR) 10 MG tablet Take 1 tablet (10 mg total) by mouth daily. 90 tablet 3  . cycloSPORINE (RESTASIS) 0.05 % ophthalmic emulsion Place 1 drop into both eyes daily.     Marland Kitchen diltiazem (CARDIZEM CD) 240 MG 24 hr capsule Take 1 capsule (240 mg total) by mouth daily. 90 capsule 6  . furosemide (LASIX) 40 MG tablet Take 40 mg (1 tab) in am and 20 mg (1/2 tab) in pm 135 tablet 6  . hydrocortisone-pramoxine (PROCTOFOAM-HC) rectal foam Place 1 applicator rectally 2 (two) times daily as needed for hemorrhoids or itching.    . Lutein 20 MG CAPS Take 1 capsule by mouth daily.     . metoprolol succinate (TOPROL-XL) 50 MG 24 hr tablet Take 1 tablet (50 mg total) by mouth 2 (two) times daily. 180 tablet 6  . Multiple Vitamins-Minerals (ICAPS) CAPS Take 2 capsules by mouth daily.    Marland Kitchen OVER THE COUNTER MEDICATION Med Name: BENE-FIBER Take two (2) teaspoons by mouth daily with 8 oz water.    . potassium chloride SA (K-DUR,KLOR-CON) 20 MEQ tablet Take 1 tablet (20 mEq total) by mouth daily. 90 tablet 6  . triamcinolone cream (KENALOG) 0.1 % Apply 1 application topically 2 (two) times daily as needed (neck).     No current facility-administered medications for this encounter.     BP 129/69   Pulse 87   Wt 148 lb 8 oz (67.4 kg)   SpO2 100%   BMI 25.49 kg/m  General: NAD Neck: No JVD, no thyromegaly or thyroid nodule.  Lungs: Clear to auscultation bilaterally with normal respiratory effort. CV: Nondisplaced PMI.  Heart irregular S1/S2, no S3/S4, no murmur.  1+ edema 1/2 to knees  bilaterally.  No carotid bruit.  Normal pedal pulses.  Abdomen: Soft, nontender, no hepatosplenomegaly, no distention.  Skin: Intact without lesions or rashes.  Neurologic: Alert and oriented x 3.  Psych: Normal affect. Extremities: No clubbing or cyanosis.  HEENT: Normal.   Assessment/Plan: 1. Atrial fibrillation:  Chronic, has recurred after DCCV in 12/16 on amiodarone.  She has failed amiodarone and Multaq.  She is not a good sotalol or Tikosyn candidate as QTc has been prolonged.  We have decided on following a rate control and anticoagulation strategy. HR is controlled. - Continue Toprol XL 50 mg bid and diltiazem CD 240 mg daily.    - Continue Eliquis.  CBC today.  2. HTN: BP not elevated.   3. Hyperlipidemia: Good lipids in 4/19.    4. Carotid bruit: Mild stenosis only.  5.  Leg fatigue: ABIs normal in 2/18.   6. Chronic diastolic CHF: She does not look volume overloaded. Peripheral edema likely related to diltiazem, no JVD.   - Continue Lasix 40 qam/20 qpm. BMET today.   - She needs to wear compression stockings during the day.   Followup in 4 months    Marca Ancona 01/30/2018

## 2018-03-14 ENCOUNTER — Other Ambulatory Visit (HOSPITAL_COMMUNITY): Payer: Self-pay | Admitting: Cardiology

## 2018-04-12 ENCOUNTER — Other Ambulatory Visit: Payer: Self-pay | Admitting: Family Medicine

## 2018-04-12 ENCOUNTER — Ambulatory Visit
Admission: RE | Admit: 2018-04-12 | Discharge: 2018-04-12 | Disposition: A | Discharge status: Home / Self Care | Payer: Medicare Other | Source: Ambulatory Visit | Attending: Family Medicine | Admitting: Family Medicine

## 2018-04-12 DIAGNOSIS — N6489 Other specified disorders of breast: Secondary | ICD-10-CM

## 2018-04-12 DIAGNOSIS — N6320 Unspecified lump in the left breast, unspecified quadrant: Secondary | ICD-10-CM

## 2018-04-24 ENCOUNTER — Ambulatory Visit (INDEPENDENT_AMBULATORY_CARE_PROVIDER_SITE_OTHER): Payer: Medicare Other | Admitting: Family Medicine

## 2018-04-24 ENCOUNTER — Encounter: Payer: Self-pay | Admitting: Family Medicine

## 2018-04-24 VITALS — BP 128/86 | Ht 64.0 in | Wt 145.2 lb

## 2018-04-24 DIAGNOSIS — N63 Unspecified lump in unspecified breast: Secondary | ICD-10-CM | POA: Diagnosis not present

## 2018-04-24 NOTE — Progress Notes (Signed)
   Subjective:    Patient ID: Julie Swanson, female    DOB: Apr 02, 1928, 84 y.o.   MRN: [id number]  HPI Pt here to discuss mammogram results. Pt states they would like to do a breast biopsy and pt would like to discuss this with provider.  15 minutes was spent with patient today discussing healthcare issues which they came.  More than 50% of this visit-total duration of visit-was spent in counseling and coordination of care.  Please see diagnosis regarding the focus of this coordination and care  Time was spent going over the mammogram the ultrasound with the other doctors set  Review of Systems     Objective:   Physical Exam  Breast exam there is an area within the left breast which is in the medial superior aspect which appears to be approximately 2 cm x 2 cm x 1 cm.  It could well be scar tissue but there is no way for knowing it is recommended to do biopsy      Assessment & Plan:  I have recommended the biopsy The patient does not want to do at this time She would like to think about it She feels confident that it is benign She has a wedding coming up in October She would like to delay this until after the wedding I told her that a biopsy is very simple and straightforward and then be best that her to get it done soon  I will give her time to think about this then we will need to pursue it hopefully

## 2018-05-14 ENCOUNTER — Ambulatory Visit: Payer: Medicare Other | Admitting: Family Medicine

## 2018-05-15 ENCOUNTER — Ambulatory Visit (INDEPENDENT_AMBULATORY_CARE_PROVIDER_SITE_OTHER): Payer: Medicare Other | Admitting: Family Medicine

## 2018-05-15 VITALS — BP 138/88 | Temp 98.8°F | Ht 64.0 in | Wt 144.4 lb

## 2018-05-15 DIAGNOSIS — L2389 Allergic contact dermatitis due to other agents: Secondary | ICD-10-CM

## 2018-05-15 DIAGNOSIS — L03116 Cellulitis of left lower limb: Secondary | ICD-10-CM | POA: Diagnosis not present

## 2018-05-15 DIAGNOSIS — Z23 Encounter for immunization: Secondary | ICD-10-CM | POA: Diagnosis not present

## 2018-05-15 MED ORDER — CEPHALEXIN 500 MG PO CAPS: ORAL_CAPSULE | ORAL | 0 refills | Status: DC

## 2018-05-15 MED ORDER — PREDNISONE 20 MG PO TABS: ORAL_TABLET | ORAL | 0 refills | Status: DC

## 2018-05-15 MED ORDER — MOMETASONE FUROATE 0.1 % EX CREA: TOPICAL_CREAM | CUTANEOUS | 1 refills | Status: DC

## 2018-05-15 NOTE — Progress Notes (Signed)
   Subjective:    Patient ID: Julie Swanson, female    DOB: [birth year], 84 y.o.   MRN: [id number]  HPI Pt here for bite on left great toe. Happened about 2 weeks ago. Pt states it itches bad. Has tried Cortisone cream, soda/vinegar solution, metholate, after bite creams with no relief. Pt would also like to get a flu shot.  Patient relates it itches very bad causes discomfort draining some denies fever chills sweats denies any other type of underlying issues. There is some soreness around it.  Also itches a lot Review of Systems Denies chest tightness pressure pain shortness breath nausea vomiting diarrhea fever chills sweats    Objective:   Physical Exam  Significant inflammation of the toe along with clear drainage some early signs of cellulitis also contact dermatitis rest the foot exam appears normal Calf normal ankle normal foot normal except for what was stated above 15 minutes was spent with patient today discussing healthcare issues which they came.  More than 50% of this visit-total duration of visit-was spent in counseling and coordination of care.  Please see diagnosis regarding the focus of this coordination and care       Assessment & Plan:  Localized inflammatory reaction with contact dermatitis and early cellulitis antibiotic for the next 7 days as well as short course of steroids follow-up if progressive troubles or if worse  Flu shot given  After further consideration I recommended for the patient not to take the prednisone because it could affect how the flu shot works for her Will send in steroid cream

## 2018-05-17 ENCOUNTER — Encounter: Payer: Self-pay | Admitting: Family Medicine

## 2018-05-17 ENCOUNTER — Telehealth: Payer: Self-pay | Admitting: Family Medicine

## 2018-05-17 ENCOUNTER — Ambulatory Visit (INDEPENDENT_AMBULATORY_CARE_PROVIDER_SITE_OTHER): Payer: Medicare Other | Admitting: Family Medicine

## 2018-05-17 VITALS — BP 126/70 | Temp 98.7°F | Ht 64.0 in | Wt 145.0 lb

## 2018-05-17 DIAGNOSIS — L03032 Cellulitis of left toe: Secondary | ICD-10-CM | POA: Diagnosis not present

## 2018-05-17 NOTE — Telephone Encounter (Signed)
Called Julie Swanson - scheduled for this afternoon, asked to arrive at 4:30

## 2018-05-17 NOTE — Progress Notes (Signed)
   Subjective:    Patient ID: Julie Swanson, female    DOB: Mar 11, 1928, 84 y.o.   MRN: [id number]  HPI Patient is here today with complaints of a itchy rash on chest, back, back of right leg, face, neck.  I she relates that this started several days ago she thinks it was started in relation to possible with Keflex she does not know Pulled off a tick off of left leg on august 28th. She denies high fever chills sweats Recheck left great toe. Also a new blister popped up on left leg.  Relates itching around her great toe with some swelling and clear drainage denies any other particular troubles  Denies high fever chills sweats Went to urgent care they put her on doxycycline   Review of Systems Patient denies knee pain hip pain abdominal pain chest congestion cough    Objective:   Physical Exam Knee is normal calf normal ankle normal minimal rash noted around the neck and on her lower back Her toe is somewhat swollen with some regional redness a wound on the top and some blistering       Assessment & Plan:  This appears to be a combination of some localized infection along with contact dermatitis Doxycycline is reasonable Triamcinolone cream Hold off on prednisone Recheck again in a few days Warning signs discussed

## 2018-05-17 NOTE — Telephone Encounter (Signed)
Pt was seen on 05/15/18 for left big toe wound. Her toe is now bruised and open and oozing it is about quarter size bruise. Also her left foot and leg is swollen. She has a rash on her back, chest, behind the right knee and bumps on the left shin.Darel Hong called and spoke with the triage nurse after hours yesterday and was told to take her to urgent care. They took her to [facility]. At [facility] they changed her antibiotic to doxycycline and is treating this as an infection. Darel Hong pts daughter is concerned about this and if she needs to come in and be seen. CB# [phone].

## 2018-05-17 NOTE — Telephone Encounter (Signed)
Spoke with daughter and they would like an appointment for later this evening. Informed daughter that everyone is at lunch that has access to schedule but we will give her a call back after lunch to let her know a time. Daughter verbalized understanding.

## 2018-05-17 NOTE — Telephone Encounter (Signed)
Very hard to say with 100% certainty over the phone Certainly doxycycline is reasonable I do not feel the flu vaccine is causing any of these issues I will be happy to work her into the schedule later this afternoon if she is interested Otherwise we can do a recheck next week

## 2018-05-17 NOTE — Telephone Encounter (Signed)
Daughter Darel Hong wanted you to know there has been a change with pt. Went to urgent care yesterday. Because pt took off bandage in the middle bc it was itching. When daughter saw it it was oozing, bloody, bruised. Toe was swelling and going into the arch of foot. Took her to urgent care. Antibiotic was changed to doxy 100mg  for 10 days. Told to use otc antibiotic ointment today and then go to calamine lotion to dry it up. Wanted to make sure dr Lorin Picket agreed with this treatment. Also a new red blister bump came up on her shin yesterday. No swelling in leg. Just foot and toe.   Also an itchy rash came up last night on chest, back, waist, leg, face and behind ear. No sob. Daughter states she did get a flu vaccine and she is sensitive to everything and also has taken two antibiotics that could cause the rash. States she feels like. No fever.   Please call Darel Hong back.

## 2018-05-21 ENCOUNTER — Ambulatory Visit (INDEPENDENT_AMBULATORY_CARE_PROVIDER_SITE_OTHER): Payer: Medicare Other | Admitting: Family Medicine

## 2018-05-21 ENCOUNTER — Encounter: Payer: Self-pay | Admitting: Family Medicine

## 2018-05-21 VITALS — BP 110/68 | Temp 98.7°F | Ht 64.0 in | Wt 146.0 lb

## 2018-05-21 DIAGNOSIS — L2389 Allergic contact dermatitis due to other agents: Secondary | ICD-10-CM | POA: Diagnosis not present

## 2018-05-21 DIAGNOSIS — L03032 Cellulitis of left toe: Secondary | ICD-10-CM | POA: Diagnosis not present

## 2018-05-21 NOTE — Progress Notes (Signed)
   Subjective:    Patient ID: Julie Swanson, female    DOB: 1928-05-05, 84 y.o.   MRN: [id number]  HPIpt arrives with daughters Darel Hong and Vikki Ports.  Recheck cellulitis of left great toe. Daughter Darel Hong states it is worse. Had to stop cream that was prescribed because it made it worse. More blisters and skin got red when using.   Itchy rash is spreading more. New spots lower legs, behind the knees back and chest.   Just left DR. Hall's office. Saw Amber PA  ordered lab work for RMSF and lymes. Just had blood drawn and prescribed prednisone today.   Significant cellulitis of the toe as well as a contact dermatitis recently got a flu shot so therefore I did not want to put her on prednisone but the dermatologist recommends a low-dose prednisone as well as steroid treatment currently on doxycycline they are also going ahead do lab work for RMSF as well as Lyme's disease and they will send [place] the results PMH benign  Review of Systems Please see above denies any chest tightness pressure pain shortness breath fever chills sweats nausea vomiting    Objective:   Physical Exam Lungs clear respiratory rate normal lower leg nontender cellulitis is noted in the toe but in my opinion actually start look better it started to dry up where it was draining pus there is multiple blisters around the foot that is consistent with a contact dermatitis.  No sign of any blood clot.  15 minutes was spent with patient today discussing healthcare issues which they came.  More than 50% of this visit-total duration of visit-was spent in counseling and coordination of care.  Please see diagnosis regarding the focus of this coordination and care       Assessment & Plan:  Cellulitis with contact dermatitis Prednisone taper as per dermatology Doxycycline for the full 10 days Await the lab work Recheck the patient in approximately 10 days follow-up sooner progressive troubles warning signs were discussed in detail warm  soaks as well as gentle cleaning recommended

## 2018-05-28 ENCOUNTER — Telehealth: Payer: Self-pay | Admitting: Family Medicine

## 2018-05-28 NOTE — Telephone Encounter (Signed)
Mom has appt tomorrow but they don't think they need to keep it unless Dr. Lorin PicketScott wants to see her. Daughter said her sore toe is healing well and that the labwork Dr. Margo AyeHall did showed she tested positive for Lyme's disease, but she has no symptoms at this time. They extended her Doxycycline another 12 days for a total of 21 days of treatment.  Do they really need to keep appt?  They have a f/u appt with Dr. Margo AyeHall September 25th. She is doing good now per daughter. Will drop off copy of labwork one day this week.

## 2018-05-28 NOTE — Telephone Encounter (Signed)
Please let the family know it is not necessary to follow-up here.  Follow-up with us if any issues in the future. Keep all regular follow-up visits Please let the patient know that I hope she [place] a good [place] [facility] Please make sure to tell the front they can remove this office visit from tomorrow to schedule

## 2018-05-28 NOTE — Telephone Encounter (Signed)
Dtr is aware of all. Please remove this pt from the schedule for 05/29/2018.

## 2018-05-29 ENCOUNTER — Ambulatory Visit: Payer: Medicare Other | Admitting: Family Medicine

## 2018-06-04 ENCOUNTER — Ambulatory Visit (HOSPITAL_COMMUNITY)
Admission: RE | Admit: 2018-06-04 | Discharge: 2018-06-04 | Disposition: A | Discharge status: Home / Self Care | Payer: Medicare Other | Source: Ambulatory Visit | Attending: Cardiology | Admitting: Cardiology

## 2018-06-04 VITALS — BP 118/70 | HR 87 | Wt 144.6 lb

## 2018-06-04 DIAGNOSIS — I4819 Other persistent atrial fibrillation: Secondary | ICD-10-CM

## 2018-06-04 DIAGNOSIS — Z8249 Family history of ischemic heart disease and other diseases of the circulatory system: Secondary | ICD-10-CM | POA: Diagnosis not present

## 2018-06-04 DIAGNOSIS — I482 Chronic atrial fibrillation: Secondary | ICD-10-CM | POA: Insufficient documentation

## 2018-06-04 DIAGNOSIS — I481 Persistent atrial fibrillation: Secondary | ICD-10-CM

## 2018-06-04 DIAGNOSIS — I110 Hypertensive heart disease with heart failure: Secondary | ICD-10-CM | POA: Diagnosis not present

## 2018-06-04 DIAGNOSIS — I5032 Chronic diastolic (congestive) heart failure: Secondary | ICD-10-CM | POA: Diagnosis not present

## 2018-06-04 DIAGNOSIS — E785 Hyperlipidemia, unspecified: Secondary | ICD-10-CM | POA: Diagnosis not present

## 2018-06-04 DIAGNOSIS — I341 Nonrheumatic mitral (valve) prolapse: Secondary | ICD-10-CM | POA: Diagnosis not present

## 2018-06-04 DIAGNOSIS — Z7901 Long term (current) use of anticoagulants: Secondary | ICD-10-CM | POA: Insufficient documentation

## 2018-06-04 DIAGNOSIS — R0989 Other specified symptoms and signs involving the circulatory and respiratory systems: Secondary | ICD-10-CM | POA: Diagnosis not present

## 2018-06-04 DIAGNOSIS — Z79899 Other long term (current) drug therapy: Secondary | ICD-10-CM | POA: Diagnosis not present

## 2018-06-04 LAB — CBC
HCT: 39.1 % (ref 36.0–46.0)
Hemoglobin: 12.8 g/dL (ref 12.0–15.0)
MCH: 31.2 pg (ref 26.0–34.0)
MCHC: 32.7 g/dL (ref 30.0–36.0)
MCV: 95.4 fL (ref 78.0–100.0)
PLATELETS: 204 10*3/uL (ref 150–400)
RBC: 4.1 MIL/uL (ref 3.87–5.11)
RDW: 12.7 % (ref 11.5–15.5)
WBC: 6.8 10*3/uL (ref 4.0–10.5)

## 2018-06-04 LAB — BASIC METABOLIC PANEL
Anion gap: 9 (ref 5–15)
BUN: 15 mg/dL (ref 8–23)
CALCIUM: 9 mg/dL (ref 8.9–10.3)
CO2: 28 mmol/L (ref 22–32)
Chloride: 100 mmol/L (ref 98–111)
Creatinine, Ser: 1.06 mg/dL — ABNORMAL HIGH (ref 0.44–1.00)
GFR calc Af Amer: 53 mL/min — ABNORMAL LOW (ref >60)
GFR, EST NON AFRICAN AMERICAN: 45 mL/min — AB (ref >60)
GLUCOSE: 94 mg/dL (ref 70–99)
Potassium: 3.8 mmol/L (ref 3.5–5.1)
Sodium: 137 mmol/L (ref 135–145)

## 2018-06-04 NOTE — Patient Instructions (Signed)
Labs today (will call for abnormal results, otherwise no news is good news)  Follow up in 4 months, please call our clinic in December to schedule your follow up.  [phone], Option 3.

## 2018-06-04 NOTE — Progress Notes (Signed)
Patient ID: Julie Swanson, female   DOB: 1927-12-28, 84 y.o.   MRN: [id number] PCP: Dr. Lilyan Punt Cardiology: Dr. Shirlee Latch  84 y.o. presents for followup of paroxysmal atrial fibrillation and diastolic CHF.  She had been having runs of rapid tachypalpitations when I saw her initially.  She was quite symptomatic with episodes.  Event monitor showed runs of atrial fibrillation with RVR.  I started her on Multaq for rhythm control given her significant symptoms and on apixaban.  She did not tolerate diltiazem due to ankle swelling and was switched over to Coreg for rate control.  She did not tolerate Coreg and is back on propranolol, which she does ok with.  Echo in 11/14 showed EF 60-65% with mild MR. No exertional chest pain or dyspnea. No lightheadedness.  She went into atrial fibrillation again in 8/16.  She had DCCV in 9/16 back to NSR and was continued on Multaq.  However, she went back into atrial fibrillation shortly after on despite Multaq use.  Multaq was stopped.  Given ongoing fatigue with atrial fibrillation, she was started on amiodarone.  DCCV was done in 12/16, she went back into NSR.  Propranolol was stopped due to bradycardia.  Unfortunately, atrial fibrillation has recurred.  She saw Tereso Newcomer and it was decided to try to reload her on amiodarone and attempt DCCV one more time.  Later in 12/16, I cardioverted her again while on amiodarone.  However, at 09/10/15 appt in atrial fibrillation clinic, she was back in atrial fibrillation.  She developed a tremor on amiodarone.  Amiodarone was stopped with plan to rate control/anticoagulate going forward.  She is now on Toprol XL and diltiazem CD.  Echo in 2/19 showed EF 60-65%, mild LVH, moderate TR, PASP 37 mmHg.   She remains in atrial fibrillation.  HR 80s today.  Weight down 4 lbs.  She has been stable recently from a cardiac standpoint, though she has had cellulitis on her foot as well as Lyme disease recently.  Less active because of foot  cellulitis.  This is improving.  No dyspnea walking around the house.  Mild dyspnea walking out to the mailbox.  No chest pain.    Labs (10/14): LDL 90, HDL 41, K 4.1, creatinine 0.9 Labs (4/15): LDL 74, HDL 40, K 4.1, creatinine 4.09 Labs (10/15): LDL 73, HDL 38 Labs (12/15): K 4, creatinine 0.9, HCT 36 Labs (8/16): K 4, creatinine 0.97, HCT 37.8, TSH normal Labs (9/16): K 4.6, creatinine 1.06, LDL 68, HDL 47 Labs (11/16): TSH normal, HCT 34.1, LFTs normal Labs (12/16): creatinine 0.96, HCT 35.9 Labs (5/17): K 4.1, creatinine 0.99, LDL 77, HDL 38 Labs (2/18): K 4, creatinine 1.05 Labs (4/18): BNP 321 Labs (5/18): K 3.9, creatinine 1.06, LDL 62 Labs (9/18): K 3.9, creatinine 1.04 Labs (2/19): hgb 12.9, K 3.9, creatinine 0.95 Labs (4/19): LDL 69, HDL 38 Labs (8/19): hgb 12.6, creatinine 0.98  PMH: 1. LHC (8/10) with minimal nonobstructive disease.  ETT (10/13) with 2'53" exercise, nonspecific ECG changes with no evidence for ischemia.  2. Mitral valve prolapse: Echo (11/14) with EF 60-65%, mild MR.  3. Atrial fibrillation:  Event monitor (10/13) with PACs only.  Event monitor (11/14) showed atrial fibrillation with RVR. She does not tolerate metoprolol (nightmares).  She had ankle swelling with diltiazem. Possible fatigue/diarrhea with Coreg. Holter (8/16) with persistent atrial fibrillation.  DCCV to NSR in 9/16.  Back in atrial fibrillation by 10/16 despite Multaq. DCCV to NSR x 2 in 12/16 on  amiodarone, but atrial fibrillation recurred.  Amiodarone stopped. 4. HTN 5. Hyperlipidemia 6. Hysterectomy. 7. Carotid disease: Carotid dopplers (1/15) with prominent plaque, mild stenosis.  8. Internal hemorrhoids with episodic rectal bleeding.  9. Macular degeneration.  10. PFTs: Mild obstructive lung disease.  11. PAD: ABIs (2/17) were normal, but there was evidence for "developing fem-pop disease."  ABIs normal in 2/18.  12. Pancreatic cysts on CT.  13. Chronic diastolic CHF - Echo (2/19):  EF 60-65%, mild LVH, moderate TR, PASP 37 mmHg.  14. H/o Lyme disease  SH: Widow, lives in [place], nonsmoker.  2 daughters.   FH: Mother with CVA, CAD.  Father with CAD.   ROS: All systems reviewed and negative except as per HPI.   Current Outpatient Medications  Medication Sig Dispense Refill  . acetaminophen (TYLENOL) 500 MG tablet Take 500 mg by mouth 2 (two) times daily as needed for moderate pain.    Marland Kitchen. apixaban (ELIQUIS) 5 MG TABS tablet Take 1 tablet (5 mg total) by mouth 2 (two) times daily. 180 tablet 3  . atorvastatin (LIPITOR) 10 MG tablet Take 1 tablet (10 mg total) by mouth daily. 90 tablet 3  . cycloSPORINE (RESTASIS) 0.05 % ophthalmic emulsion Place 1 drop into both eyes daily.     Marland Kitchen. diltiazem (CARDIZEM CD) 240 MG 24 hr capsule TAKE (1) CAPSULE BY MOUTH ONCE DAILY. 90 capsule 3  . doxycycline (VIBRAMYCIN) 100 MG capsule Take 100 mg by mouth 2 (two) times daily.    . furosemide (LASIX) 40 MG tablet TAKE 1 TABLET BY MOUTH EVERY MORNING AND 1/2 TABLET EVERY EVENING. 135 tablet 3  . hydrocortisone-pramoxine (PROCTOFOAM-HC) rectal foam Place 1 applicator rectally 2 (two) times daily as needed for hemorrhoids or itching.    . Lutein 20 MG CAPS Take 1 capsule by mouth daily.     . metoprolol succinate (TOPROL-XL) 50 MG 24 hr tablet TAKE (1) TABLET BY MOUTH TWICE DAILY. 180 tablet 3  . Multiple Vitamins-Minerals (ICAPS) CAPS Take 2 capsules by mouth daily.    Marland Kitchen. OVER THE COUNTER MEDICATION Med Name: BENE-FIBER Take two (2) teaspoons by mouth daily with 8 oz water.    . potassium chloride SA (K-DUR,KLOR-CON) 20 MEQ tablet Take 1 tablet (20 mEq total) by mouth daily. 90 tablet 6   No current facility-administered medications for this encounter.     BP 118/70   Pulse 87   Wt 65.6 kg (144 lb 9.6 oz)   SpO2 98%   BMI 24.82 kg/m  General: NAD Neck: No JVD, no thyromegaly or thyroid nodule.  Lungs: Clear to auscultation bilaterally with normal respiratory effort. CV:  Nondisplaced PMI.  Heart irregular S1/S2, no S3/S4, no murmur.  No peripheral edema.  No carotid bruit.  Normal pedal pulses.  Abdomen: Soft, nontender, no hepatosplenomegaly, no distention.  Skin: Intact without lesions or rashes.  Neurologic: Alert and oriented x 3.  Psych: Normal affect. Extremities: No clubbing or cyanosis.  HEENT: Normal.   Assessment/Plan: 1. Atrial fibrillation:  Chronic, has recurred after DCCV in 12/16 on amiodarone.  She has failed amiodarone and Multaq.  She is not a good sotalol or Tikosyn candidate as QTc has been prolonged.  We have decided on following a rate control and anticoagulation strategy. HR is controlled. - Continue Toprol XL 50 mg bid and diltiazem CD 240 mg daily.    - Continue Eliquis. CBC today.  2. HTN: BP not elevated.   3. Hyperlipidemia: Good lipids in 4/19.  4. Carotid bruit: Mild stenosis only.  5. Leg fatigue: ABIs normal in 2/18.   6. Chronic diastolic CHF: She does not look volume overloaded.  - Continue Lasix 40 qam/20 qpm. BMET today.   - She needs to wear compression stockings during the day.   Followup in 4 months    Marca Ancona 06/04/2018

## 2018-08-12 ENCOUNTER — Encounter: Payer: Self-pay | Admitting: Family Medicine

## 2018-08-12 ENCOUNTER — Ambulatory Visit (INDEPENDENT_AMBULATORY_CARE_PROVIDER_SITE_OTHER): Payer: Medicare Other | Admitting: Family Medicine

## 2018-08-12 ENCOUNTER — Other Ambulatory Visit: Payer: Self-pay | Admitting: Family Medicine

## 2018-08-12 VITALS — BP 120/70 | Ht 64.0 in | Wt 148.0 lb

## 2018-08-12 DIAGNOSIS — N63 Unspecified lump in unspecified breast: Secondary | ICD-10-CM

## 2018-08-12 DIAGNOSIS — L2389 Allergic contact dermatitis due to other agents: Secondary | ICD-10-CM

## 2018-08-12 DIAGNOSIS — R51 Headache: Secondary | ICD-10-CM | POA: Diagnosis not present

## 2018-08-12 DIAGNOSIS — W19XXXA Unspecified fall, initial encounter: Secondary | ICD-10-CM | POA: Diagnosis not present

## 2018-08-12 MED ORDER — MUPIROCIN 2 % EX OINT: TOPICAL_OINTMENT | CUTANEOUS | 0 refills | Status: DC

## 2018-08-12 NOTE — Progress Notes (Signed)
Subjective:    Patient ID: Julie Swanson, female    DOB: Mar 22, 1928, 84 y.o.   MRN: [id number]  HPI Patient is here today to discuss her biopsy of her left foot.The foot biopsy was done a month ago,they told her it was contact dermitis.(Place on her foot is not healing she says.-Area on her foot was biopsied by the dermatologist but does not seem to be healing up it drains intermittently and this was over 3 months ago  She says she is due for a breast biopsy and she says she is a little reluctant to have that done.  We talked at length about this I told her how this could be early cancer she feels it is more scar tissue she feels this is going down she does not want to have a biopsy does not want any treatment she is willing to read look at this again in February she understands that delaying diagnosis can affect outcomes  She fell last Monday and broke her glassed and bruised her eye.This fall is the first in years and she does not know what caused her to fall.  She banged the side of her head did not lose consciousness her friends helped her up she was not sure if she slipped or just lost her balance she denies having this problem happened to her before   Review of Systems  Constitutional: Negative for activity change, appetite change and fatigue.  HENT: Negative for congestion and rhinorrhea.   Respiratory: Negative for cough and shortness of breath.   Cardiovascular: Negative for chest pain and leg swelling.  Gastrointestinal: Negative for abdominal pain and diarrhea.  Endocrine: Negative for polydipsia and polyphagia.  Skin: Negative for color change.  Neurological: Negative for dizziness and weakness.  Psychiatric/Behavioral: Negative for behavioral problems and confusion.       Objective:   Physical Exam  Constitutional: She appears well-nourished. No distress.  HENT:  Head: Normocephalic and atraumatic.  Eyes: Right eye exhibits no discharge. Left eye exhibits no discharge.    Neck: No tracheal deviation present.  Cardiovascular: Normal rate, regular rhythm and normal heart sounds.  No murmur heard. Pulmonary/Chest: Effort normal and breath sounds normal. No respiratory distress.  Musculoskeletal: She exhibits no edema.  Lymphadenopathy:    She has no cervical adenopathy.  Neurological: She is alert. Coordination normal.  Skin: Skin is warm and dry.  Psychiatric: She has a normal mood and affect. Her behavior is normal.  Vitals reviewed. Has contusions on the side of her face but no sign of any fracture  25 minutes was spent with the patient.  This statement verifies that 25 minutes was indeed spent with the patient.  More than 50% of this visit-total duration of the visit-was spent in counseling and coordination of care. The issues that the patient came in for today as reflected in the diagnosis (s) please refer to documentation for further details.   The left breast was examined there is no hard nodularity the patient feels that the area in her breast is scar tissue Area in the left breast upper aspect is noted does not appear to be hard     Assessment & Plan:  Fall with contusions-we discussed ways to help prevent falls  Right foot biopsy area not healing up I recommended wound care center.  Patient does not want to do this at this time.  She will try Bactroban ointment twice daily over the next few weeks to see if this will help  if not wound care center  Left breast scar tissue versus growth-patient does not want biopsy she does agree to doing diagnostic mammogram with ultrasound in February.  She will follow-up office visit after that

## 2018-08-12 NOTE — Progress Notes (Signed)
Pt would [facility] [place]. Contacted [facility] [place]. [facility] to B[facility] diagnostic mammo due to patient yearly exam needed in February. Ultrasound changed to ultrasound limited to include axilla. Tonya at breast center will send orders to provider for co sign. Pt advised of appt on 10/14/18 at 9:50 am. Wear 2 piece clothing, no perfume/deoderant/powders, and update insurance card.

## 2018-08-12 NOTE — Progress Notes (Signed)
Just wanting to clarify is this for both breast or just left breast? Please advise. Thank you

## 2018-09-09 ENCOUNTER — Other Ambulatory Visit (HOSPITAL_COMMUNITY): Payer: Self-pay | Admitting: Cardiology

## 2018-09-10 ENCOUNTER — Telehealth (HOSPITAL_COMMUNITY): Payer: Self-pay

## 2018-09-10 NOTE — Telephone Encounter (Signed)
Medication Samples have been provided to the patient.  Drug name: Eliquis       Strength: 5 mg        Qty: 4  LOT: [id number]  Exp.Date: 11/21  Dosing instructions: TAKE (1) TABLET BY MOUTH TWICE DAILY.  The patient has been instructed regarding the correct time, dose, and frequency of taking this medication, including desired effects and most common side effects.   Julie Swanson 8:52 AM 09/10/2018

## 2018-10-14 ENCOUNTER — Other Ambulatory Visit: Payer: Medicare Other

## 2018-10-17 ENCOUNTER — Ambulatory Visit (HOSPITAL_COMMUNITY)
Admission: RE | Admit: 2018-10-17 | Discharge: 2018-10-17 | Disposition: A | Discharge status: Home / Self Care | Payer: Medicare Other | Source: Ambulatory Visit | Attending: Cardiology | Admitting: Cardiology

## 2018-10-17 VITALS — BP 124/76 | HR 94 | Wt 147.6 lb

## 2018-10-17 DIAGNOSIS — Z7901 Long term (current) use of anticoagulants: Secondary | ICD-10-CM | POA: Insufficient documentation

## 2018-10-17 DIAGNOSIS — Z90710 Acquired absence of both cervix and uterus: Secondary | ICD-10-CM | POA: Insufficient documentation

## 2018-10-17 DIAGNOSIS — J449 Chronic obstructive pulmonary disease, unspecified: Secondary | ICD-10-CM | POA: Insufficient documentation

## 2018-10-17 DIAGNOSIS — R0989 Other specified symptoms and signs involving the circulatory and respiratory systems: Secondary | ICD-10-CM | POA: Diagnosis not present

## 2018-10-17 DIAGNOSIS — I110 Hypertensive heart disease with heart failure: Secondary | ICD-10-CM | POA: Insufficient documentation

## 2018-10-17 DIAGNOSIS — I5032 Chronic diastolic (congestive) heart failure: Secondary | ICD-10-CM | POA: Insufficient documentation

## 2018-10-17 DIAGNOSIS — I4819 Other persistent atrial fibrillation: Secondary | ICD-10-CM | POA: Diagnosis not present

## 2018-10-17 DIAGNOSIS — E785 Hyperlipidemia, unspecified: Secondary | ICD-10-CM | POA: Diagnosis not present

## 2018-10-17 DIAGNOSIS — Z79899 Other long term (current) drug therapy: Secondary | ICD-10-CM | POA: Insufficient documentation

## 2018-10-17 DIAGNOSIS — Z8249 Family history of ischemic heart disease and other diseases of the circulatory system: Secondary | ICD-10-CM | POA: Insufficient documentation

## 2018-10-17 DIAGNOSIS — I482 Chronic atrial fibrillation, unspecified: Secondary | ICD-10-CM | POA: Insufficient documentation

## 2018-10-17 DIAGNOSIS — I341 Nonrheumatic mitral (valve) prolapse: Secondary | ICD-10-CM | POA: Diagnosis not present

## 2018-10-17 LAB — CBC
HCT: 38.8 % (ref 36.0–46.0)
HEMOGLOBIN: 12.5 g/dL (ref 12.0–15.0)
MCH: 29.8 pg (ref 26.0–34.0)
MCHC: 32.2 g/dL (ref 30.0–36.0)
MCV: 92.4 fL (ref 80.0–100.0)
PLATELETS: 221 10*3/uL (ref 150–400)
RBC: 4.2 MIL/uL (ref 3.87–5.11)
RDW: 13.1 % (ref 11.5–15.5)
WBC: 5.8 10*3/uL (ref 4.0–10.5)
nRBC: 0 % (ref 0.0–0.2)

## 2018-10-17 LAB — LIPID PANEL
CHOLESTEROL: 128 mg/dL (ref 0–200)
HDL: 33 mg/dL — ABNORMAL LOW (ref >40)
LDL CALC: 68 mg/dL (ref 0–99)
Total CHOL/HDL Ratio: 3.9 RATIO
Triglycerides: 133 mg/dL (ref <150)
VLDL: 27 mg/dL (ref 0–40)

## 2018-10-17 MED ORDER — METOPROLOL SUCCINATE ER 50 MG PO TB24: ORAL_TABLET | ORAL | 3 refills | Status: DC

## 2018-10-17 MED ORDER — DILTIAZEM HCL ER COATED BEADS 240 MG PO CP24: ORAL_CAPSULE | ORAL | 3 refills | Status: DC

## 2018-10-17 MED ORDER — FUROSEMIDE 40 MG PO TABS: ORAL_TABLET | ORAL | 3 refills | Status: DC

## 2018-10-17 MED ORDER — APIXABAN 5 MG PO TABS: ORAL_TABLET | ORAL | 3 refills | Status: DC

## 2018-10-17 MED ORDER — POTASSIUM CHLORIDE CRYS ER 20 MEQ PO TBCR: 20.0000 meq | EXTENDED_RELEASE_TABLET | Freq: Every day | ORAL | 6 refills | Status: DC

## 2018-10-17 MED ORDER — ATORVASTATIN CALCIUM 10 MG PO TABS: 10.0000 mg | ORAL_TABLET | Freq: Every day | ORAL | 3 refills | Status: DC

## 2018-10-17 NOTE — Patient Instructions (Signed)
Labs today. We will call you if results are abnormal.  Your provider requests you have Carotid dopplers.  Follow up in 6 months

## 2018-10-17 NOTE — Progress Notes (Signed)
Patient ID: Julie SageNancy S Swanson, female   DOB: 1928-03-19, 85 y.o.   MRN: [id number] PCP: Dr. Lilyan PuntScott Luking Cardiology: Dr. Shirlee LatchMcLean  85 y.o. presents for followup of paroxysmal atrial fibrillation and diastolic CHF.  She had been having runs of rapid tachypalpitations when I saw her initially.  She was quite symptomatic with episodes.  Event monitor showed runs of atrial fibrillation with RVR.  I started her on Multaq for rhythm control given her significant symptoms and on apixaban.  She did not tolerate diltiazem due to ankle swelling and was switched over to Coreg for rate control.  She did not tolerate Coreg and is back on propranolol, which she does ok with.  Echo in 11/14 showed EF 60-65% with mild MR. No exertional chest pain or dyspnea. No lightheadedness.  She went into atrial fibrillation again in 8/16.  She had DCCV in 9/16 back to NSR and was continued on Multaq.  However, she went back into atrial fibrillation shortly after on despite Multaq use.  Multaq was stopped.  Given ongoing fatigue with atrial fibrillation, she was started on amiodarone.  DCCV was done in 12/16, she went back into NSR.  Propranolol was stopped due to bradycardia.  Unfortunately, atrial fibrillation has recurred.  She saw [name] and it was decided to try to reload her on amiodarone and attempt DCCV one more time.  Later in 12/16, I cardioverted her again while on amiodarone.  However, at 09/10/15 appt in atrial fibrillation clinic, she was back in atrial fibrillation.  She developed a tremor on amiodarone.  Amiodarone was stopped with plan to rate control/anticoagulate going forward.  She is now on Toprol XL and diltiazem CD.  Echo in 2/19 showed EF 60-65%, mild LVH, moderate TR, PASP 37 mmHg.   She remains in atrial fibrillation.  HR 90s today.  Weight is up 3 lbs.  She is doing well symptomatically. No chest pain.  No dyspnea with her ADLs.  BP controlled.  No lightheadedness or falls.      Labs (10/14): LDL 90, HDL 41,  K 4.1, creatinine 0.9 Labs (4/15): LDL 74, HDL 40, K 4.1, creatinine 1.300.87 Labs (10/15): LDL 73, HDL 38 Labs (12/15): K 4, creatinine 0.9, HCT 36 Labs (8/16): K 4, creatinine 0.97, HCT 37.8, TSH normal Labs (9/16): K 4.6, creatinine 1.06, LDL 68, HDL 47 Labs (11/16): TSH normal, HCT 34.1, LFTs normal Labs (12/16): creatinine 0.96, HCT 35.9 Labs (5/17): K 4.1, creatinine 0.99, LDL 77, HDL 38 Labs (2/18): K 4, creatinine 1.05 Labs (4/18): BNP 321 Labs (5/18): K 3.9, creatinine 1.06, LDL 62 Labs (9/18): K 3.9, creatinine 1.04 Labs (2/19): hgb 12.9, K 3.9, creatinine 0.95 Labs (4/19): LDL 69, HDL 38 Labs (8/19): hgb 12.6, creatinine 0.98 Labs (9/19): K 3.8, creatinine 1.06, hgb 12.8  PMH: 1. LHC (8/10) with minimal nonobstructive disease.  ETT (10/13) with 2'53" exercise, nonspecific ECG changes with no evidence for ischemia.  2. Mitral valve prolapse: Echo (11/14) with EF 60-65%, mild MR.  3. Atrial fibrillation:  Event monitor (10/13) with PACs only.  Event monitor (11/14) showed atrial fibrillation with RVR. She does not tolerate metoprolol (nightmares).  She had ankle swelling with diltiazem. Possible fatigue/diarrhea with Coreg. Holter (8/16) with persistent atrial fibrillation.  DCCV to NSR in 9/16.  Back in atrial fibrillation by 10/16 despite Multaq. DCCV to NSR x 2 in 12/16 on amiodarone, but atrial fibrillation recurred.  Amiodarone stopped. 4. HTN 5. Hyperlipidemia 6. Hysterectomy. 7. Carotid disease: Carotid dopplers (1/15)  with prominent plaque, mild stenosis.  8. Internal hemorrhoids with episodic rectal bleeding.  9. Macular degeneration.  10. PFTs: Mild obstructive lung disease.  11. PAD: ABIs (2/17) were normal, but there was evidence for "developing fem-pop disease."  ABIs normal in 2/18.  12. Pancreatic cysts on CT.  13. Chronic diastolic CHF - Echo (2/19): EF 60-65%, mild LVH, moderate TR, PASP 37 mmHg.  14. H/o Lyme disease  SH: Widow, lives in [place],  nonsmoker.  2 daughters.   FH: Mother with CVA, CAD.  Father with CAD.   ROS: All systems reviewed and negative except as per HPI.   Current Outpatient Medications  Medication Sig Dispense Refill  . acetaminophen (TYLENOL) 500 MG tablet Take 500 mg by mouth 2 (two) times daily as needed for moderate pain.    Marland Kitchen apixaban (ELIQUIS) 5 MG TABS tablet TAKE (1) TABLET BY MOUTH TWICE DAILY. 180 tablet 3  . atorvastatin (LIPITOR) 10 MG tablet Take 1 tablet (10 mg total) by mouth daily. 90 tablet 3  . cycloSPORINE (RESTASIS) 0.05 % ophthalmic emulsion Place 1 drop into both eyes daily.     Marland Kitchen diltiazem (CARDIZEM CD) 240 MG 24 hr capsule TAKE (1) CAPSULE BY MOUTH ONCE DAILY. 90 capsule 3  . doxycycline (VIBRAMYCIN) 100 MG capsule Take 100 mg by mouth 2 (two) times daily.    . furosemide (LASIX) 40 MG tablet TAKE 1 TABLET BY MOUTH EVERY MORNING AND 1/2 TABLET EVERY EVENING. 135 tablet 3  . hydrocortisone-pramoxine (PROCTOFOAM-HC) rectal foam Place 1 applicator rectally 2 (two) times daily as needed for hemorrhoids or itching.    . Lutein 20 MG CAPS Take 1 capsule by mouth daily.     . metoprolol succinate (TOPROL-XL) 50 MG 24 hr tablet TAKE (1) TABLET BY MOUTH TWICE DAILY. 180 tablet 3  . Multiple Vitamins-Minerals (ICAPS) CAPS Take 2 capsules by mouth daily.    . mupirocin ointment (BACTROBAN) 2 % Apply bid for 3 weeks to the foot 15 g 0  . OVER THE COUNTER MEDICATION Med Name: BENE-FIBER Take two (2) teaspoons by mouth daily with 8 oz water.    . potassium chloride SA (K-DUR,KLOR-CON) 20 MEQ tablet Take 1 tablet (20 mEq total) by mouth daily. 90 tablet 6   No current facility-administered medications for this encounter.     BP 124/76   Pulse 94   Wt 67 kg (147 lb 9.6 oz)   SpO2 98%   BMI 25.34 kg/m  General: NAD Neck: No JVD, no thyromegaly or thyroid nodule.  Lungs: Clear to auscultation bilaterally with normal respiratory effort. CV: Nondisplaced PMI.  Heart irregular S1/S2, no S3/S4, no  murmur.  1+ ankle edema.  Right carotid bruit.  Normal pedal pulses.  Abdomen: Soft, nontender, no hepatosplenomegaly, no distention.  Skin: Intact without lesions or rashes.  Neurologic: Alert and oriented x 3.  Psych: Normal affect. Extremities: No clubbing or cyanosis.  HEENT: Normal.   Assessment/Plan: 1. Atrial fibrillation:  Chronic, has recurred after DCCV in 12/16 on amiodarone.  She has failed amiodarone and Multaq.  She is not a good sotalol or Tikosyn candidate as QTc has been prolonged.  We have decided on following a rate control and anticoagulation strategy. HR is controlled. - Continue Toprol XL 50 mg bid and diltiazem CD 240 mg daily.    - Continue Eliquis. CBC today.  2. HTN: BP not elevated.   3. Hyperlipidemia: Check lipids today.    4. Carotid bruit: On right, will obtain carotid  dopplers.   5. Chronic diastolic CHF: She does not look volume overloaded.  - Continue Lasix 40 qam/20 qpm. BMET today.   - She needs to wear compression stockings during the day.   Followup in 6 months    Marca Ancona 10/17/2018

## 2018-10-18 ENCOUNTER — Ambulatory Visit
Admission: RE | Admit: 2018-10-18 | Discharge: 2018-10-18 | Disposition: A | Discharge status: Home / Self Care | Payer: Medicare Other | Source: Ambulatory Visit | Attending: Family Medicine | Admitting: Family Medicine

## 2018-10-18 ENCOUNTER — Other Ambulatory Visit: Payer: Self-pay | Admitting: Family Medicine

## 2018-10-18 ENCOUNTER — Encounter (HOSPITAL_COMMUNITY): Payer: Medicare Other | Admitting: Cardiology

## 2018-10-18 ENCOUNTER — Encounter: Payer: Self-pay | Admitting: Family Medicine

## 2018-10-18 DIAGNOSIS — N6320 Unspecified lump in the left breast, unspecified quadrant: Secondary | ICD-10-CM

## 2018-10-18 DIAGNOSIS — N63 Unspecified lump in unspecified breast: Secondary | ICD-10-CM

## 2018-10-21 ENCOUNTER — Ambulatory Visit (HOSPITAL_COMMUNITY)
Admission: RE | Admit: 2018-10-21 | Discharge: 2018-10-21 | Disposition: A | Discharge status: Home / Self Care | Payer: Medicare Other | Source: Ambulatory Visit | Attending: Cardiology | Admitting: Cardiology

## 2018-10-21 DIAGNOSIS — R0989 Other specified symptoms and signs involving the circulatory and respiratory systems: Secondary | ICD-10-CM

## 2018-10-22 ENCOUNTER — Telehealth (HOSPITAL_COMMUNITY): Payer: Self-pay

## 2018-10-22 NOTE — Telephone Encounter (Signed)
-----   Message from Laurey Moralealton S McLean, MD sent at 10/22/2018 12:30 AM EST ----- Mild bilateral ICA stenosis.

## 2018-10-22 NOTE — Telephone Encounter (Signed)
Spoke with patient, aware of [place] results of Carotids.  Pt appreciative.

## 2019-01-27 ENCOUNTER — Other Ambulatory Visit: Payer: Self-pay

## 2019-01-27 ENCOUNTER — Ambulatory Visit (INDEPENDENT_AMBULATORY_CARE_PROVIDER_SITE_OTHER): Payer: Medicare Other | Admitting: Family Medicine

## 2019-01-27 DIAGNOSIS — L2389 Allergic contact dermatitis due to other agents: Secondary | ICD-10-CM

## 2019-01-27 MED ORDER — PREDNISONE 20 MG PO TABS: ORAL_TABLET | ORAL | 0 refills | Status: DC

## 2019-01-27 NOTE — Progress Notes (Signed)
   Subjective:    Patient ID: Julie Swanson, female    DOB: 11/10/1927, 85 y.o.   MRN: [id number] A/V Rash  This is a new problem. The current episode started today. The affected locations include the right eye and face. The rash is characterized by redness and swelling. Associated with: pt was working in garden last week. Associated symptoms include facial edema. Pertinent negatives include no congestion, cough, fatigue, fever, rhinorrhea or shortness of breath. (Puffy right eyelid) Past treatments include antihistamine.   Pt daughter states that last year patient was tested for Lyme and Alpha Gal. Tested positive for both.   Virtual Visit via Video Note  I connected with Julie Swanson on 01/27/19 at 11:30 AM EDT by a video enabled telemedicine application and verified that I am speaking with the correct person using two identifiers.  Location: Patient: home Provider: office   I discussed the limitations of evaluation and management by telemedicine and the availability of in person appointments. The patient expressed understanding and agreed to proceed.  History of Present Illness:    Observations/Objective:   Assessment and Plan:   Follow Up Instructions:    I discussed the assessment and treatment plan with the patient. The patient was provided an opportunity to ask questions and all were answered. The patient agreed with the plan and demonstrated an understanding of the instructions.   The patient was advised to call back or seek an in-person evaluation if the symptoms worsen or if the condition fails to improve as anticipated.  I provided 17 minutes of non-face-to-face time during this encounter.   Marlowe [place], LPN    Review of Systems  Constitutional: Negative for activity change, fatigue and fever.  HENT: Negative for congestion and rhinorrhea.   Respiratory: Negative for cough, chest tightness and shortness of breath.   Cardiovascular: Negative for chest pain  and leg swelling.  Gastrointestinal: Negative for abdominal pain and nausea.  Skin: Positive for rash. Negative for color change.  Neurological: Negative for dizziness and headaches.  Psychiatric/Behavioral: Negative for agitation and behavioral problems.   Patient has been outside doing gardening    Objective:   Physical Exam Patient does not appear to be in any distress on visual exam she has redness around right eye swelling above and below along with a few patches around the mid forehead and a small spot near varicose vein on her leg none of these are tender they more itch       Assessment & Plan:  More than likely contact dermatitis Her history is complex because of previous tick related illness rash We will go a prednisone taper over the next 9 days she should be able to tolerate this fine If she has progressive troubles or problems she will notify us I do not find evidence of cellulitis or tick related illness currently I do not recommend doxycycline currently.  All questions answered. 15 minutes was spent with patient today discussing healthcare issues which they came.  More than 50% of this visit-total duration of visit-was spent in counseling and coordination of care.  Please see diagnosis regarding the focus of this coordination and care

## 2019-03-21 ENCOUNTER — Ambulatory Visit (HOSPITAL_COMMUNITY)
Admission: RE | Admit: 2019-03-21 | Discharge: 2019-03-21 | Disposition: A | Discharge status: Home / Self Care | Payer: Medicare Other | Source: Ambulatory Visit | Attending: Cardiology | Admitting: Cardiology

## 2019-03-21 ENCOUNTER — Other Ambulatory Visit: Payer: Self-pay

## 2019-03-21 DIAGNOSIS — Z7901 Long term (current) use of anticoagulants: Secondary | ICD-10-CM | POA: Diagnosis not present

## 2019-03-21 DIAGNOSIS — I482 Chronic atrial fibrillation, unspecified: Secondary | ICD-10-CM | POA: Insufficient documentation

## 2019-03-21 DIAGNOSIS — I4819 Other persistent atrial fibrillation: Secondary | ICD-10-CM | POA: Diagnosis not present

## 2019-03-21 DIAGNOSIS — E785 Hyperlipidemia, unspecified: Secondary | ICD-10-CM | POA: Diagnosis not present

## 2019-03-21 DIAGNOSIS — Z79899 Other long term (current) drug therapy: Secondary | ICD-10-CM | POA: Diagnosis not present

## 2019-03-21 DIAGNOSIS — R0989 Other specified symptoms and signs involving the circulatory and respiratory systems: Secondary | ICD-10-CM | POA: Diagnosis not present

## 2019-03-21 DIAGNOSIS — I5032 Chronic diastolic (congestive) heart failure: Secondary | ICD-10-CM | POA: Insufficient documentation

## 2019-03-21 DIAGNOSIS — Z8249 Family history of ischemic heart disease and other diseases of the circulatory system: Secondary | ICD-10-CM | POA: Diagnosis not present

## 2019-03-21 DIAGNOSIS — I110 Hypertensive heart disease with heart failure: Secondary | ICD-10-CM | POA: Insufficient documentation

## 2019-03-21 LAB — BRAIN NATRIURETIC PEPTIDE: B Natriuretic Peptide: 320.3 pg/mL — ABNORMAL HIGH (ref 0.0–100.0)

## 2019-03-21 LAB — BASIC METABOLIC PANEL
Anion gap: 10 (ref 5–15)
BUN: 11 mg/dL (ref 8–23)
CO2: 29 mmol/L (ref 22–32)
Calcium: 9.2 mg/dL (ref 8.9–10.3)
Chloride: 101 mmol/L (ref 98–111)
Creatinine, Ser: 0.92 mg/dL (ref 0.44–1.00)
GFR calc Af Amer: >60 mL/min (ref >60)
GFR calc non Af Amer: 55 mL/min — ABNORMAL LOW (ref >60)
Glucose, Bld: 110 mg/dL — ABNORMAL HIGH (ref 70–99)
Potassium: 4.2 mmol/L (ref 3.5–5.1)
Sodium: 140 mmol/L (ref 135–145)

## 2019-03-21 MED ORDER — FUROSEMIDE 40 MG PO TABS: 40.0000 mg | ORAL_TABLET | Freq: Two times a day (BID) | ORAL | 3 refills | Status: DC

## 2019-03-21 NOTE — Patient Instructions (Signed)
INCREASE Furosemide to 40mg  (1 tab) twice a day  Labs today and repeat in 10 days We will only contact you if something comes back abnormal or we need to make some changes. Otherwise no news is good news!  Your physician recommends that you schedule a follow-up appointment in: 3 months with Dr. Aundra Dubin. You will get a call to schedule this appointment  At the [facility], you and your health needs are our priority. As part of our continuing mission to provide you with exceptional heart care, we have created designated Provider Care Teams. These Care Teams include your primary Cardiologist (physician) and Advanced Practice Providers (APPs- Physician Assistants and Nurse Practitioners) who all work together to provide you with the care you need, when you need it.   You may see any of the following providers on your designated Care Team at your next follow up: Marland Kitchen Dr Glori Bickers . Dr Loralie Champagne . Darrick Grinder, NP

## 2019-03-23 NOTE — Progress Notes (Signed)
Patient ID: Carroll SageNancy S Bookbinder, female   DOB: 1927/11/29, 85 y.o.   MRN: [id number] PCP: Dr. Lilyan PuntScott Luking Cardiology: Dr. Shirlee LatchMcLean  85 y.o. presents for followup of paroxysmal atrial fibrillation and diastolic CHF.  She had been having runs of rapid tachypalpitations when I saw her initially.  She was quite symptomatic with episodes.  Event monitor showed runs of atrial fibrillation with RVR.  I started her on Multaq for rhythm control given her significant symptoms and on apixaban.  She did not tolerate diltiazem due to ankle swelling and was switched over to Coreg for rate control.  She did not tolerate Coreg and is back on propranolol, which she does ok with.  Echo in 11/14 showed EF 60-65% with mild MR. No exertional chest pain or dyspnea. No lightheadedness.  She went into atrial fibrillation again in 8/16.  She had DCCV in 9/16 back to NSR and was continued on Multaq.  However, she went back into atrial fibrillation shortly after on despite Multaq use.  Multaq was stopped.  Given ongoing fatigue with atrial fibrillation, she was started on amiodarone.  DCCV was done in 12/16, she went back into NSR.  Propranolol was stopped due to bradycardia.  Unfortunately, atrial fibrillation has recurred.  She saw [name] and it was decided to try to reload her on amiodarone and attempt DCCV one more time.  Later in 12/16, I cardioverted her again while on amiodarone.  However, at 09/10/15 appt in atrial fibrillation clinic, she was back in atrial fibrillation.  She developed a tremor on amiodarone.  Amiodarone was stopped with plan to rate control/anticoagulate going forward.  She is now on Toprol XL and diltiazem CD.  Echo in 2/19 showed EF 60-65%, mild LVH, moderate TR, PASP 37 mmHg.   She remains in atrial fibrillation, HR controlled.  No chest pain.  She can walk about 10-15 minutes outside then fatigues.  Dyspnea with stairs/inclines.  Weight is stable.  She has had increased lower leg edema.   ECG (personally  reviewed): atrial fibrillation with inferior and anterolateral TWIs      Labs (10/14): LDL 90, HDL 41, K 4.1, creatinine 0.9 Labs (4/15): LDL 74, HDL 40, K 4.1, creatinine 4.780.87 Labs (10/15): LDL 73, HDL 38 Labs (12/15): K 4, creatinine 0.9, HCT 36 Labs (8/16): K 4, creatinine 0.97, HCT 37.8, TSH normal Labs (9/16): K 4.6, creatinine 1.06, LDL 68, HDL 47 Labs (11/16): TSH normal, HCT 34.1, LFTs normal Labs (12/16): creatinine 0.96, HCT 35.9 Labs (5/17): K 4.1, creatinine 0.99, LDL 77, HDL 38 Labs (2/18): K 4, creatinine 1.05 Labs (4/18): BNP 321 Labs (5/18): K 3.9, creatinine 1.06, LDL 62 Labs (9/18): K 3.9, creatinine 1.04 Labs (2/19): hgb 12.9, K 3.9, creatinine 0.95 Labs (4/19): LDL 69, HDL 38 Labs (8/19): hgb 12.6, creatinine 0.98 Labs (9/19): K 3.8, creatinine 1.06, hgb 12.8 Labs (2/20): LDL 68, hgb 12.5  PMH: 1. LHC (8/10) with minimal nonobstructive disease.  ETT (10/13) with 2'53" exercise, nonspecific ECG changes with no evidence for ischemia.  2. Mitral valve prolapse: Echo (11/14) with EF 60-65%, mild MR.  3. Atrial fibrillation:  Event monitor (10/13) with PACs only.  Event monitor (11/14) showed atrial fibrillation with RVR. She does not tolerate metoprolol (nightmares).  She had ankle swelling with diltiazem. Possible fatigue/diarrhea with Coreg. Holter (8/16) with persistent atrial fibrillation.  DCCV to NSR in 9/16.  Back in atrial fibrillation by 10/16 despite Multaq. DCCV to NSR x 2 in 12/16 on amiodarone, but atrial  fibrillation recurred.  Amiodarone stopped. 4. HTN 5. Hyperlipidemia 6. Hysterectomy. 7. Carotid disease: Carotid dopplers (1/15) with prominent plaque, mild stenosis.  - Carotid dopplers (2/20): mild BICA stenosis.  8. Internal hemorrhoids with episodic rectal bleeding.  9. Macular degeneration.  10. PFTs: Mild obstructive lung disease.  11. PAD: ABIs (2/17) were normal, but there was evidence for "developing fem-pop disease."  ABIs normal in 2/18.   12. Pancreatic cysts on CT.  13. Chronic diastolic CHF - Echo (2/19): EF 60-65%, mild LVH, moderate TR, PASP 37 mmHg.  14. H/o Lyme disease  SH: Widow, lives in [place], nonsmoker.  2 daughters.   FH: Mother with CVA, CAD.  Father with CAD.   ROS: All systems reviewed and negative except as per HPI.   Current Outpatient Medications  Medication Sig Dispense Refill  . acetaminophen (TYLENOL) 500 MG tablet Take 500 mg by mouth 2 (two) times daily as needed for moderate pain.    Marland Kitchen. apixaban (ELIQUIS) 5 MG TABS tablet TAKE (1) TABLET BY MOUTH TWICE DAILY. 180 tablet 3  . atorvastatin (LIPITOR) 10 MG tablet Take 1 tablet (10 mg total) by mouth daily. 90 tablet 3  . cycloSPORINE (RESTASIS) 0.05 % ophthalmic emulsion Place 1 drop into both eyes daily.     Marland Kitchen. diltiazem (CARDIZEM CD) 240 MG 24 hr capsule TAKE (1) CAPSULE BY MOUTH ONCE DAILY. 90 capsule 3  . furosemide (LASIX) 40 MG tablet Take 1 tablet (40 mg total) by mouth 2 (two) times daily. 180 tablet 3  . hydrocortisone-pramoxine (PROCTOFOAM-HC) rectal foam Place 1 applicator rectally 2 (two) times daily as needed for hemorrhoids or itching.    . Lutein 20 MG CAPS Take 1 capsule by mouth daily.     . metoprolol succinate (TOPROL-XL) 50 MG 24 hr tablet TAKE (1) TABLET BY MOUTH TWICE DAILY. 180 tablet 3  . Multiple Vitamins-Minerals (ICAPS) CAPS Take 2 capsules by mouth daily.    . mupirocin ointment (BACTROBAN) 2 % Apply bid for 3 weeks to the foot 15 g 0  . OVER THE COUNTER MEDICATION Med Name: BENE-FIBER Take two (2) teaspoons by mouth daily with 8 oz water.    . potassium chloride SA (K-DUR,KLOR-CON) 20 MEQ tablet Take 1 tablet (20 mEq total) by mouth daily. 90 tablet 6  . predniSONE (DELTASONE) 20 MG tablet 3qd for 3d then 2qd for 3d then 1qd for 3d 18 tablet 0   No current facility-administered medications for this encounter.     BP 140/80, HR 97, wt 147 lbs General: NAD Neck: JVP 8 cm with HJR, no thyromegaly or thyroid nodule.   Lungs: Clear to auscultation bilaterally with normal respiratory effort. CV: Nondisplaced PMI.  Heart irregular S1/S2, no S3/S4, no murmur.  1+ edema to knees bilaterally.  No carotid bruit.  Normal pedal pulses.  Abdomen: Soft, nontender, no hepatosplenomegaly, no distention.  Skin: Intact without lesions or rashes.  Neurologic: Alert and oriented x 3.  Psych: Normal affect. Extremities: No clubbing or cyanosis.  HEENT: Normal.    Assessment/Plan: 1. Atrial fibrillation:  Chronic, has recurred after DCCV in 12/16 on amiodarone.  She has failed amiodarone and Multaq.  She is not a good sotalol or Tikosyn candidate as QTc has been prolonged.  We have decided on following a rate control and anticoagulation strategy. HR is controlled. - Continue Toprol XL 50 mg bid and diltiazem CD 240 mg daily.    - Continue Eliquis.  2. HTN: BP generally controlled.   3. Hyperlipidemia:  Good lipids in 2/20.    4. Carotid bruit: On right, minimal stenosis on carotid dopplers.   5. Chronic diastolic CHF: She looks mildly volume overloaded on exam, NYHA class II-III symptoms.   - Increase Lasix to 40 mg bid.  BMET today and again in 10 days.    - She needs to wear compression stockings during the day.   Followup in 3 months    Loralie Champagne 03/23/2019

## 2019-03-31 ENCOUNTER — Ambulatory Visit (HOSPITAL_COMMUNITY)
Admission: RE | Admit: 2019-03-31 | Discharge: 2019-03-31 | Disposition: A | Discharge status: Home / Self Care | Payer: Medicare Other | Source: Ambulatory Visit | Attending: Internal Medicine | Admitting: Internal Medicine

## 2019-03-31 ENCOUNTER — Other Ambulatory Visit: Payer: Self-pay

## 2019-03-31 DIAGNOSIS — I5032 Chronic diastolic (congestive) heart failure: Secondary | ICD-10-CM | POA: Insufficient documentation

## 2019-03-31 LAB — BASIC METABOLIC PANEL
Anion gap: 9 (ref 5–15)
BUN: 12 mg/dL (ref 8–23)
CO2: 29 mmol/L (ref 22–32)
Calcium: 9 mg/dL (ref 8.9–10.3)
Chloride: 100 mmol/L (ref 98–111)
Creatinine, Ser: 0.97 mg/dL (ref 0.44–1.00)
GFR calc Af Amer: >60 mL/min (ref >60)
GFR calc non Af Amer: 52 mL/min — ABNORMAL LOW (ref >60)
Glucose, Bld: 96 mg/dL (ref 70–99)
Potassium: 3.7 mmol/L (ref 3.5–5.1)
Sodium: 138 mmol/L (ref 135–145)

## 2019-04-22 ENCOUNTER — Other Ambulatory Visit: Payer: Medicare Other

## 2019-05-06 ENCOUNTER — Other Ambulatory Visit: Payer: Medicare Other

## 2019-05-12 ENCOUNTER — Ambulatory Visit
Admission: RE | Admit: 2019-05-12 | Discharge: 2019-05-12 | Disposition: A | Discharge status: Home / Self Care | Payer: Medicare Other | Source: Ambulatory Visit | Attending: Family Medicine | Admitting: Family Medicine

## 2019-05-12 ENCOUNTER — Other Ambulatory Visit: Payer: Self-pay | Admitting: Family Medicine

## 2019-05-12 ENCOUNTER — Other Ambulatory Visit: Payer: Self-pay

## 2019-05-12 DIAGNOSIS — N63 Unspecified lump in unspecified breast: Secondary | ICD-10-CM

## 2019-05-21 ENCOUNTER — Telehealth: Payer: Self-pay | Admitting: Family Medicine

## 2019-05-21 ENCOUNTER — Other Ambulatory Visit: Payer: Self-pay

## 2019-05-21 ENCOUNTER — Ambulatory Visit (INDEPENDENT_AMBULATORY_CARE_PROVIDER_SITE_OTHER): Payer: Medicare Other | Admitting: Family Medicine

## 2019-05-21 DIAGNOSIS — R928 Other abnormal and inconclusive findings on diagnostic imaging of breast: Secondary | ICD-10-CM

## 2019-05-21 NOTE — Progress Notes (Signed)
   Subjective:    Patient ID: Julie Swanson, female    DOB: [birth year], 85 y.o.   MRN: [id number]  HPIFollow up on mammogram results.  We did discuss her mammogram it was abnormal and they recommended either following core biopsy patient chose to look at it again in 6 months she does not want to do a biopsy She is minimizing her risk of coronavirus she is trying to stay away from crowds she is taking her medicine on a regular basis with her cardiologist and doing well flu shot recommended Virtual Visit via Telephone Note  I connected with Julie Swanson on 05/21/19 at 11:00 AM EDT by telephone and verified that I am speaking with the correct person using two identifiers.  Location: Patient: home Provider: office   I discussed the limitations, risks, security and privacy concerns of performing an evaluation and management service by telephone and the availability of in person appointments. I also discussed with the patient that there may be a patient responsible charge related to this service. The patient expressed understanding and agreed to proceed.   History of Present Illness:    Observations/Objective:   Assessment and Plan:   Follow Up Instructions:    I discussed the assessment and treatment plan with the patient. The patient was provided an opportunity to ask questions and all were answered. The patient agreed with the plan and demonstrated an understanding of the instructions.   The patient was advised to call back or seek an in-person evaluation if the symptoms worsen or if the condition fails to improve as anticipated.  I provided 15 minutes of non-face-to-face time during this encounter.        Review of Systems  Constitutional: Negative for activity change, fatigue and fever.  HENT: Negative for congestion and rhinorrhea.   Respiratory: Negative for cough, chest tightness and shortness of breath.   Cardiovascular: Negative for chest pain and leg swelling.   Gastrointestinal: Negative for abdominal pain and nausea.  Skin: Negative for color change.  Neurological: Negative for dizziness and headaches.  Psychiatric/Behavioral: Negative for agitation and behavioral problems.       Objective:   Physical Exam  Today's visit was via telephone Physical exam was not possible for this visit       Assessment & Plan:  Abnormal mammogram Will mail a copy to the patient per her request Repeat that again in 6 months Cardiology stable follow through with cardiologist regular basis Follow-up here 6 months Flu shot recommended Family will schedule to have the flu shot in the car

## 2019-05-21 NOTE — Telephone Encounter (Signed)
Per Dr. Nicki Reaper, I called the daughter's cell phone # and left her a message to give [place] a call to schedule a CAR flu shot for her mother.

## 2019-06-14 ENCOUNTER — Other Ambulatory Visit (INDEPENDENT_AMBULATORY_CARE_PROVIDER_SITE_OTHER): Payer: Medicare Other | Admitting: *Deleted

## 2019-06-14 DIAGNOSIS — Z23 Encounter for immunization: Secondary | ICD-10-CM | POA: Diagnosis not present

## 2019-06-16 ENCOUNTER — Ambulatory Visit (HOSPITAL_COMMUNITY)
Admission: RE | Admit: 2019-06-16 | Discharge: 2019-06-16 | Disposition: A | Discharge status: Home / Self Care | Payer: Medicare Other | Source: Ambulatory Visit | Attending: Cardiology | Admitting: Cardiology

## 2019-06-16 ENCOUNTER — Other Ambulatory Visit: Payer: Self-pay

## 2019-06-16 ENCOUNTER — Encounter (HOSPITAL_COMMUNITY): Payer: Self-pay | Admitting: Cardiology

## 2019-06-16 VITALS — BP 118/70 | HR 91 | Wt 149.6 lb

## 2019-06-16 DIAGNOSIS — I482 Chronic atrial fibrillation, unspecified: Secondary | ICD-10-CM | POA: Diagnosis not present

## 2019-06-16 DIAGNOSIS — J449 Chronic obstructive pulmonary disease, unspecified: Secondary | ICD-10-CM | POA: Insufficient documentation

## 2019-06-16 DIAGNOSIS — E785 Hyperlipidemia, unspecified: Secondary | ICD-10-CM | POA: Insufficient documentation

## 2019-06-16 DIAGNOSIS — Z8249 Family history of ischemic heart disease and other diseases of the circulatory system: Secondary | ICD-10-CM | POA: Insufficient documentation

## 2019-06-16 DIAGNOSIS — R0989 Other specified symptoms and signs involving the circulatory and respiratory systems: Secondary | ICD-10-CM | POA: Diagnosis not present

## 2019-06-16 DIAGNOSIS — I5032 Chronic diastolic (congestive) heart failure: Secondary | ICD-10-CM | POA: Insufficient documentation

## 2019-06-16 DIAGNOSIS — I341 Nonrheumatic mitral (valve) prolapse: Secondary | ICD-10-CM | POA: Diagnosis not present

## 2019-06-16 DIAGNOSIS — I4819 Other persistent atrial fibrillation: Secondary | ICD-10-CM

## 2019-06-16 DIAGNOSIS — Z79899 Other long term (current) drug therapy: Secondary | ICD-10-CM | POA: Insufficient documentation

## 2019-06-16 DIAGNOSIS — Z7901 Long term (current) use of anticoagulants: Secondary | ICD-10-CM | POA: Insufficient documentation

## 2019-06-16 DIAGNOSIS — I110 Hypertensive heart disease with heart failure: Secondary | ICD-10-CM | POA: Insufficient documentation

## 2019-06-16 LAB — CBC
HCT: 38.8 % (ref 36.0–46.0)
Hemoglobin: 12.6 g/dL (ref 12.0–15.0)
MCH: 30.7 pg (ref 26.0–34.0)
MCHC: 32.5 g/dL (ref 30.0–36.0)
MCV: 94.4 fL (ref 80.0–100.0)
Platelets: 216 10*3/uL (ref 150–400)
RBC: 4.11 MIL/uL (ref 3.87–5.11)
RDW: 12.9 % (ref 11.5–15.5)
WBC: 6.2 10*3/uL (ref 4.0–10.5)
nRBC: 0 % (ref 0.0–0.2)

## 2019-06-16 LAB — BASIC METABOLIC PANEL
Anion gap: 10 (ref 5–15)
BUN: 10 mg/dL (ref 8–23)
CO2: 29 mmol/L (ref 22–32)
Calcium: 9.1 mg/dL (ref 8.9–10.3)
Chloride: 99 mmol/L (ref 98–111)
Creatinine, Ser: 0.92 mg/dL (ref 0.44–1.00)
GFR calc Af Amer: >60 mL/min (ref >60)
GFR calc non Af Amer: 55 mL/min — ABNORMAL LOW (ref >60)
Glucose, Bld: 114 mg/dL — ABNORMAL HIGH (ref 70–99)
Potassium: 3.9 mmol/L (ref 3.5–5.1)
Sodium: 138 mmol/L (ref 135–145)

## 2019-06-16 NOTE — Progress Notes (Signed)
Patient ID: Julie Swanson, female   DOB: 1928-01-21, 85 y.o.   MRN: [id number] PCP: Dr. Sallee Lange Cardiology: Dr. Aundra Dubin  85 y.o. presents for followup of paroxysmal atrial fibrillation and diastolic CHF.  She had been having runs of rapid tachypalpitations when I saw her initially.  She was quite symptomatic with episodes.  Event monitor showed runs of atrial fibrillation with RVR.  I started her on Multaq for rhythm control given her significant symptoms and on apixaban.  She did not tolerate diltiazem due to ankle swelling and was switched over to Coreg for rate control.  She did not tolerate Coreg and is back on propranolol, which she does ok with.  Echo in 11/14 showed EF 60-65% with mild MR. No exertional chest pain or dyspnea. No lightheadedness.  She went into atrial fibrillation again in 8/16.  She had DCCV in 9/16 back to NSR and was continued on Multaq.  However, she went back into atrial fibrillation shortly after on despite Multaq use.  Multaq was stopped.  Given ongoing fatigue with atrial fibrillation, she was started on amiodarone.  DCCV was done in 12/16, she went back into NSR.  Propranolol was stopped due to bradycardia.  Unfortunately, atrial fibrillation has recurred.  She saw Richardson Dopp and it was decided to try to reload her on amiodarone and attempt DCCV one more time.  Later in 12/16, I cardioverted her again while on amiodarone.  However, at 09/10/15 appt in atrial fibrillation clinic, she was back in atrial fibrillation.  She developed a tremor on amiodarone.  Amiodarone was stopped with plan to rate control/anticoagulate going forward.  She is now on Toprol XL and diltiazem CD.  Echo in 2/19 showed EF 60-65%, mild LVH, moderate TR, PASP 37 mmHg.   She remains in atrial fibrillation, HR controlled. No chest pain.  She walks about 15 minutes/day without dyspnea.  No lightheadedness.  Short of breath only if she "rushes."  No orthopnea/PND.  Still has lower leg edema, not wearing  compression stockings today.      Labs (10/14): LDL 90, HDL 41, K 4.1, creatinine 0.9 Labs (4/15): LDL 74, HDL 40, K 4.1, creatinine 0.87 Labs (10/15): LDL 73, HDL 38 Labs (12/15): K 4, creatinine 0.9, HCT 36 Labs (8/16): K 4, creatinine 0.97, HCT 37.8, TSH normal Labs (9/16): K 4.6, creatinine 1.06, LDL 68, HDL 47 Labs (11/16): TSH normal, HCT 34.1, LFTs normal Labs (12/16): creatinine 0.96, HCT 35.9 Labs (5/17): K 4.1, creatinine 0.99, LDL 77, HDL 38 Labs (2/18): K 4, creatinine 1.05 Labs (4/18): BNP 321 Labs (5/18): K 3.9, creatinine 1.06, LDL 62 Labs (9/18): K 3.9, creatinine 1.04 Labs (2/19): hgb 12.9, K 3.9, creatinine 0.95 Labs (4/19): LDL 69, HDL 38 Labs (8/19): hgb 12.6, creatinine 0.98 Labs (9/19): K 3.8, creatinine 1.06, hgb 12.8 Labs (2/20): LDL 68, hgb 12.5 Labs (7/20): K 3.7, creatinine 0.97  PMH: 1. LHC (8/10) with minimal nonobstructive disease.  ETT (10/13) with 2'53" exercise, nonspecific ECG changes with no evidence for ischemia.  2. Mitral valve prolapse: Echo (11/14) with EF 60-65%, mild MR.  3. Atrial fibrillation:  Event monitor (10/13) with PACs only.  Event monitor (11/14) showed atrial fibrillation with RVR. She does not tolerate metoprolol (nightmares).  She had ankle swelling with diltiazem. Possible fatigue/diarrhea with Coreg. Holter (8/16) with persistent atrial fibrillation.  DCCV to NSR in 9/16.  Back in atrial fibrillation by 10/16 despite Multaq. DCCV to NSR x 2 in 12/16 on amiodarone, but atrial  fibrillation recurred.  Amiodarone stopped. 4. HTN 5. Hyperlipidemia 6. Hysterectomy. 7. Carotid disease: Carotid dopplers (1/15) with prominent plaque, mild stenosis.  - Carotid dopplers (2/20): mild BICA stenosis.  8. Internal hemorrhoids with episodic rectal bleeding.  9. Macular degeneration.  10. PFTs: Mild obstructive lung disease.  11. PAD: ABIs (2/17) were normal, but there was evidence for "developing fem-pop disease."  ABIs normal in 2/18.  12.  Pancreatic cysts on CT.  13. Chronic diastolic CHF - Echo (2/19): EF 60-65%, mild LVH, moderate TR, PASP 37 mmHg.  14. H/o Lyme disease  SH: Widow, lives in [place], nonsmoker.  2 daughters.   FH: Mother with CVA, CAD.  Father with CAD.   ROS: All systems reviewed and negative except as per HPI.   Current Outpatient Medications  Medication Sig Dispense Refill  . acetaminophen (TYLENOL) 500 MG tablet Take 500 mg by mouth 2 (two) times daily as needed for moderate pain.    Marland Kitchen. apixaban (ELIQUIS) 5 MG TABS tablet TAKE (1) TABLET BY MOUTH TWICE DAILY. 180 tablet 3  . atorvastatin (LIPITOR) 10 MG tablet Take 1 tablet (10 mg total) by mouth daily. 90 tablet 3  . cycloSPORINE (RESTASIS) 0.05 % ophthalmic emulsion Place 1 drop into both eyes daily.     Marland Kitchen. diltiazem (CARDIZEM CD) 240 MG 24 hr capsule TAKE (1) CAPSULE BY MOUTH ONCE DAILY. 90 capsule 3  . furosemide (LASIX) 40 MG tablet Take 1 tablet (40 mg total) by mouth 2 (two) times daily. 180 tablet 3  . Lutein 20 MG CAPS Take 1 capsule by mouth daily.     . metoprolol succinate (TOPROL-XL) 50 MG 24 hr tablet TAKE (1) TABLET BY MOUTH TWICE DAILY. 180 tablet 3  . Multiple Vitamins-Minerals (ICAPS) CAPS Take 2 capsules by mouth daily.    Marland Kitchen. OVER THE COUNTER MEDICATION Med Name: BENE-FIBER Take two (2) teaspoons by mouth daily with 8 oz water.     No current facility-administered medications for this encounter.     BP 118/70   Pulse 91   Wt 67.9 kg (149 lb 9.6 oz)   SpO2 98%   BMI 25.68 kg/m  General: NAD Neck: No JVD, no thyromegaly or thyroid nodule.  Lungs: Clear to auscultation bilaterally with normal respiratory effort. CV: Nondisplaced PMI.  Heart irregular S1/S2, no S3/S4, no murmur.  1+ edema to knees bilaterally with venous varicosities.  No carotid bruit.  Normal pedal pulses.  Abdomen: Soft, nontender, no hepatosplenomegaly, no distention.  Skin: Intact without lesions or rashes.  Neurologic: Alert and oriented x 3.  Psych:  Normal affect. Extremities: No clubbing or cyanosis.  HEENT: Normal.    Assessment/Plan: 1. Atrial fibrillation:  Chronic, has recurred after DCCV in 12/16 on amiodarone.  She has failed amiodarone and Multaq.  She is not a good sotalol or Tikosyn candidate as QTc has been prolonged.  We have decided on following a rate control and anticoagulation strategy. HR is controlled. - Continue Toprol XL 50 mg bid and diltiazem CD 240 mg daily.    - Continue Eliquis. CBC today.  2. HTN: BP controlled.  3. Hyperlipidemia: Good lipids in 2/20.    4. Carotid bruit: On right, minimal stenosis on carotid dopplers.   5. Chronic diastolic CHF:  Chronic NYHA class II-III symptoms.  She does not look volume overloaded, no JVD.  She does have peripheral edema that I think may be more due to venous insufficiency.   - Continue Lasix 40 mg bid, BMET today.     -  She needs to wear compression stockings during the day.   Followup in 4 months    Marca Ancona 06/16/2019

## 2019-06-16 NOTE — Progress Notes (Signed)
Medication Samples have been provided to the patient.  Drug name: Eliquis       Strength: 5mg         Qty: 4 boxes  LOT: [id number]  Exp.Date: 12/22  Dosing instructions: 1 tab bid  The patient has been instructed regarding the correct time, dose, and frequency of taking this medication, including desired effects and most common side effects.   Valeda Malm 10:29 AM 06/16/2019;

## 2019-06-16 NOTE — Patient Instructions (Addendum)
Labs today We will only contact you if something comes back abnormal or we need to make some changes. Otherwise no news is good news!  WEAR compression stockings during day.  Your physician recommends that you schedule a follow-up appointment in: 4 months with Dr Aundra Dubin.  You will receive a call from our office to schedule this appointment.   At the [facility], you and your health needs are our priority. As part of our continuing mission to provide you with exceptional heart care, we have created designated Provider Care Teams. These Care Teams include your primary Cardiologist (physician) and Advanced Practice Providers (APPs- Physician Assistants and Nurse Practitioners) who all work together to provide you with the care you need, when you need it.   You may see any of the following providers on your designated Care Team at your next follow up: Marland Kitchen Dr Glori Bickers . Dr Loralie Champagne . Darrick Grinder, NP   Please be sure to bring in all your medications bottles to every appointment.

## 2019-10-07 ENCOUNTER — Other Ambulatory Visit (HOSPITAL_COMMUNITY): Payer: Self-pay | Admitting: Cardiology

## 2019-10-22 ENCOUNTER — Other Ambulatory Visit (HOSPITAL_COMMUNITY): Payer: Self-pay | Admitting: Cardiology

## 2019-11-10 ENCOUNTER — Other Ambulatory Visit: Payer: Self-pay

## 2019-11-10 ENCOUNTER — Other Ambulatory Visit: Payer: Medicare Other

## 2019-11-10 ENCOUNTER — Ambulatory Visit
Admission: RE | Admit: 2019-11-10 | Discharge: 2019-11-10 | Disposition: A | Discharge status: Home / Self Care | Payer: Medicare Other | Source: Ambulatory Visit | Attending: Family Medicine | Admitting: Family Medicine

## 2019-11-10 ENCOUNTER — Other Ambulatory Visit: Payer: Self-pay | Admitting: Family Medicine

## 2019-11-10 DIAGNOSIS — N63 Unspecified lump in unspecified breast: Secondary | ICD-10-CM

## 2019-11-10 DIAGNOSIS — R928 Other abnormal and inconclusive findings on diagnostic imaging of breast: Secondary | ICD-10-CM

## 2019-11-11 NOTE — Progress Notes (Signed)
Patient scheduled office visit with Dr Scott 11/18/2019 

## 2019-11-11 NOTE — Progress Notes (Signed)
Patient scheduled office visit with Dr Lorin Picket 11/18/2019

## 2019-11-12 ENCOUNTER — Ambulatory Visit (HOSPITAL_COMMUNITY)
Admission: RE | Admit: 2019-11-12 | Discharge: 2019-11-12 | Disposition: A | Discharge status: Home / Self Care | Payer: Medicare Other | Source: Ambulatory Visit | Attending: Cardiology | Admitting: Cardiology

## 2019-11-12 ENCOUNTER — Other Ambulatory Visit: Payer: Self-pay

## 2019-11-12 VITALS — BP 130/88 | HR 91 | Wt 150.8 lb

## 2019-11-12 DIAGNOSIS — I5032 Chronic diastolic (congestive) heart failure: Secondary | ICD-10-CM | POA: Diagnosis present

## 2019-11-12 DIAGNOSIS — I341 Nonrheumatic mitral (valve) prolapse: Secondary | ICD-10-CM | POA: Insufficient documentation

## 2019-11-12 DIAGNOSIS — I110 Hypertensive heart disease with heart failure: Secondary | ICD-10-CM | POA: Diagnosis not present

## 2019-11-12 DIAGNOSIS — I4819 Other persistent atrial fibrillation: Secondary | ICD-10-CM | POA: Diagnosis not present

## 2019-11-12 DIAGNOSIS — E785 Hyperlipidemia, unspecified: Secondary | ICD-10-CM | POA: Diagnosis not present

## 2019-11-12 DIAGNOSIS — Z7901 Long term (current) use of anticoagulants: Secondary | ICD-10-CM | POA: Diagnosis not present

## 2019-11-12 DIAGNOSIS — Z8249 Family history of ischemic heart disease and other diseases of the circulatory system: Secondary | ICD-10-CM | POA: Diagnosis not present

## 2019-11-12 DIAGNOSIS — Z79899 Other long term (current) drug therapy: Secondary | ICD-10-CM | POA: Diagnosis not present

## 2019-11-12 DIAGNOSIS — I482 Chronic atrial fibrillation, unspecified: Secondary | ICD-10-CM | POA: Diagnosis not present

## 2019-11-12 DIAGNOSIS — Z823 Family history of stroke: Secondary | ICD-10-CM | POA: Diagnosis not present

## 2019-11-12 DIAGNOSIS — J449 Chronic obstructive pulmonary disease, unspecified: Secondary | ICD-10-CM | POA: Diagnosis not present

## 2019-11-12 LAB — CBC
HCT: 37.6 % (ref 36.0–46.0)
Hemoglobin: 12.5 g/dL (ref 12.0–15.0)
MCH: 31.5 pg (ref 26.0–34.0)
MCHC: 33.2 g/dL (ref 30.0–36.0)
MCV: 94.7 fL (ref 80.0–100.0)
Platelets: 218 10*3/uL (ref 150–400)
RBC: 3.97 MIL/uL (ref 3.87–5.11)
RDW: 12.7 % (ref 11.5–15.5)
WBC: 6 10*3/uL (ref 4.0–10.5)
nRBC: 0 % (ref 0.0–0.2)

## 2019-11-12 NOTE — Progress Notes (Signed)
Patient ID: Julie Swanson, female   DOB: [birth year], 86 y.o.   MRN: [id number] PCP: Dr. Sallee Lange Cardiology: Dr. Aundra Dubin  86 y.o. presents for followup of paroxysmal atrial fibrillation and diastolic CHF.  She had been having runs of rapid tachypalpitations when I saw her initially.  She was quite symptomatic with episodes.  Event monitor showed runs of atrial fibrillation with RVR.  I started her on Multaq for rhythm control given her significant symptoms and on apixaban.  She did not tolerate diltiazem due to ankle swelling and was switched over to Coreg for rate control.  She did not tolerate Coreg and is back on propranolol, which she does ok with.  Echo in 11/14 showed EF 60-65% with mild MR. No exertional chest pain or dyspnea. No lightheadedness.  She went into atrial fibrillation again in 8/16.  She had DCCV in 9/16 back to NSR and was continued on Multaq.  However, she went back into atrial fibrillation shortly after on despite Multaq use.  Multaq was stopped.  Given ongoing fatigue with atrial fibrillation, she was started on amiodarone.  DCCV was done in 12/16, she went back into NSR.  Propranolol was stopped due to bradycardia.  Unfortunately, atrial fibrillation has recurred.  She saw Richardson Dopp and it was decided to try to reload her on amiodarone and attempt DCCV one more time.  Later in 12/16, I cardioverted her again while on amiodarone.  However, at 09/10/15 appt in atrial fibrillation clinic, she was back in atrial fibrillation.  She developed a tremor on amiodarone.  Amiodarone was stopped with plan to rate control/anticoagulate going forward.  She is now on Toprol XL and diltiazem CD.  Echo in 2/19 showed EF 60-65%, mild LVH, moderate TR, PASP 37 mmHg.   She remains in atrial fibrillation, HR controlled. Weight is stable.  No dyspnea unless she exerts herself heavily.  She walks outside for 15-20 minutes/day.  No chest pain. No lightheadedness/syncope.  She may have breast cancer and  needs a breast biopsy.   Labs (10/14): LDL 90, HDL 41, K 4.1, creatinine 0.9 Labs (4/15): LDL 74, HDL 40, K 4.1, creatinine 0.87 Labs (10/15): LDL 73, HDL 38 Labs (12/15): K 4, creatinine 0.9, HCT 36 Labs (8/16): K 4, creatinine 0.97, HCT 37.8, TSH normal Labs (9/16): K 4.6, creatinine 1.06, LDL 68, HDL 47 Labs (11/16): TSH normal, HCT 34.1, LFTs normal Labs (12/16): creatinine 0.96, HCT 35.9 Labs (5/17): K 4.1, creatinine 0.99, LDL 77, HDL 38 Labs (2/18): K 4, creatinine 1.05 Labs (4/18): BNP 321 Labs (5/18): K 3.9, creatinine 1.06, LDL 62 Labs (9/18): K 3.9, creatinine 1.04 Labs (2/19): hgb 12.9, K 3.9, creatinine 0.95 Labs (4/19): LDL 69, HDL 38 Labs (8/19): hgb 12.6, creatinine 0.98 Labs (9/19): K 3.8, creatinine 1.06, hgb 12.8 Labs (2/20): LDL 68, hgb 12.5 Labs (7/20): K 3.7, creatinine 0.97 Labs (10/20): K 3.9, creatinine 0.92, hgb 12.6  PMH: 1. LHC (8/10) with minimal nonobstructive disease.  ETT (10/13) with 2'53" exercise, nonspecific ECG changes with no evidence for ischemia.  2. Mitral valve prolapse: Echo (11/14) with EF 60-65%, mild MR.  3. Atrial fibrillation:  Event monitor (10/13) with PACs only.  Event monitor (11/14) showed atrial fibrillation with RVR. She does not tolerate metoprolol (nightmares).  She had ankle swelling with diltiazem. Possible fatigue/diarrhea with Coreg. Holter (8/16) with persistent atrial fibrillation.  DCCV to NSR in 9/16.  Back in atrial fibrillation by 10/16 despite Multaq. DCCV to NSR x 2 in 12/16  on amiodarone, but atrial fibrillation recurred.  Amiodarone stopped. 4. HTN 5. Hyperlipidemia 6. Hysterectomy. 7. Carotid disease: Carotid dopplers (1/15) with prominent plaque, mild stenosis.  - Carotid dopplers (2/20): mild BICA stenosis.  8. Internal hemorrhoids with episodic rectal bleeding.  9. Macular degeneration.  10. PFTs: Mild obstructive lung disease.  11. PAD: ABIs (2/17) were normal, but there was evidence for "developing fem-pop  disease."  ABIs normal in 2/18.  12. Pancreatic cysts on CT.  13. Chronic diastolic CHF - Echo (2/19): EF 60-65%, mild LVH, moderate TR, PASP 37 mmHg.  14. H/o Lyme disease  SH: Widow, lives in [place], nonsmoker.  2 daughters.   FH: Mother with CVA, CAD.  Father with CAD.   ROS: All systems reviewed and negative except as per HPI.   Current Outpatient Medications  Medication Sig Dispense Refill  . acetaminophen (TYLENOL) 500 MG tablet Take 500 mg by mouth 2 (two) times daily as needed for moderate pain.    Marland Kitchen atorvastatin (LIPITOR) 10 MG tablet TAKE 1 TABLET BY MOUTH EVERY DAY 90 tablet 3  . cycloSPORINE (RESTASIS) 0.05 % ophthalmic emulsion Place 1 drop into both eyes daily.     Marland Kitchen diltiazem (CARDIZEM CD) 240 MG 24 hr capsule TAKE (1) CAPSULE BY MOUTH ONCE DAILY. 90 capsule 3  . ELIQUIS 5 MG TABS tablet TAKE 1 TABLET BY MOUTH TWICE A DAY 180 tablet 3  . furosemide (LASIX) 40 MG tablet Take 1 tablet (40 mg total) by mouth 2 (two) times daily. 180 tablet 3  . KLOR-CON M20 20 MEQ tablet Take 20 mEq by mouth daily.    . Lutein 20 MG CAPS Take 1 capsule by mouth daily.     . metoprolol succinate (TOPROL-XL) 50 MG 24 hr tablet TAKE (1) TABLET BY MOUTH TWICE DAILY. 180 tablet 3  . Multiple Vitamins-Minerals (ICAPS) CAPS Take 2 capsules by mouth daily.    Marland Kitchen OVER THE COUNTER MEDICATION Med Name: BENE-FIBER Take two (2) teaspoons by mouth daily with 8 oz water.     No current facility-administered medications for this encounter.    BP 130/88   Pulse 91   Wt 68.4 kg (150 lb 12.8 oz)   SpO2 98%   BMI 25.88 kg/m  General: NAD Neck: No JVD, no thyromegaly or thyroid nodule.  Lungs: Clear to auscultation bilaterally with normal respiratory effort. CV: Nondisplaced PMI.  Heart irregular S1/S2, no S3/S4, no murmur.  1+ ankle edema.  No carotid bruit.  Normal pedal pulses.  Abdomen: Soft, nontender, no hepatosplenomegaly, no distention.  Skin: Intact without lesions or rashes.  Neurologic:  Alert and oriented x 3.  Psych: Normal affect. Extremities: No clubbing or cyanosis.  HEENT: Normal.   Assessment/Plan: 1. Atrial fibrillation:  Chronic, has recurred after DCCV in 12/16 on amiodarone.  She has failed amiodarone and Multaq.  She is not a good sotalol or Tikosyn candidate as QTc has been prolonged.  We have decided on following a rate control and anticoagulation strategy. HR is controlled. - Continue Toprol XL 50 mg bid and diltiazem CD 240 mg daily.    - Continue Eliquis. CBC today.  May hold Eliquis for 2 days prior to breast biopsy if needed.  2. HTN: BP controlled.  3. Hyperlipidemia: Check lipids today.  4. Chronic diastolic CHF:  Chronic NYHA class II symptoms.  She does not look volume overloaded, no JVD.  She does have peripheral edema that I think may be more due to venous insufficiency.   -  Continue Lasix 40 mg bid, BMET today.     - She needs to wear compression stockings during the day.   Followup in 4 months    Marca Ancona 11/12/2019

## 2019-11-12 NOTE — Patient Instructions (Signed)
No medication changes!   Labs today We will only contact you if something comes back abnormal or we need to make some changes. Otherwise no news is good news!   Your physician recommends that you schedule a follow-up appointment in: 4 months with Dr Shirlee Latch.  We will call you to schedule this appointment.    Please call office at [phone] option 2 if you have any questions or concerns.    At the [facility], you and your health needs are our priority. As part of our continuing mission to provide you with exceptional heart care, we have created designated Provider Care Teams. These Care Teams include your primary Cardiologist (physician) and Advanced Practice Providers (APPs- Physician Assistants and Nurse Practitioners) who all work together to provide you with the care you need, when you need it.   You may see any of the following providers on your designated Care Team at your next follow up: Marland Kitchen Dr Arvilla Meres . Dr Marca Ancona . Tonye Becket, NP . Robbie Lis, PA . Karle Plumber, PharmD   Please be sure to bring in all your medications bottles to every appointment.

## 2019-11-18 ENCOUNTER — Ambulatory Visit (INDEPENDENT_AMBULATORY_CARE_PROVIDER_SITE_OTHER): Payer: Medicare Other | Admitting: Family Medicine

## 2019-11-18 ENCOUNTER — Other Ambulatory Visit: Payer: Self-pay

## 2019-11-18 ENCOUNTER — Other Ambulatory Visit: Payer: Self-pay | Admitting: Family Medicine

## 2019-11-18 DIAGNOSIS — I10 Essential (primary) hypertension: Secondary | ICD-10-CM

## 2019-11-18 DIAGNOSIS — R928 Other abnormal and inconclusive findings on diagnostic imaging of breast: Secondary | ICD-10-CM

## 2019-11-18 DIAGNOSIS — E7800 Pure hypercholesterolemia, unspecified: Secondary | ICD-10-CM | POA: Diagnosis not present

## 2019-11-18 DIAGNOSIS — I4891 Unspecified atrial fibrillation: Secondary | ICD-10-CM | POA: Diagnosis not present

## 2019-11-18 NOTE — Progress Notes (Signed)
   Subjective:    Patient ID: Julie Swanson, female    DOB: 1928-03-20, 86 y.o.   MRN: [id number]  HPI  Daughter- Darel Hong Had a long video discussion with the patient regarding her abnormal mammogram regarding biopsy regarding her choices plus also options patient currently is not wanting to do the biopsy but she did patiently listen to options been presented to her and she will consider this and potentially move forward with the biopsy family will give [place] an update regarding what they find out from their discussions with the patient Patient calls to discuss recent abnormal mammogram results (patient would like a copy of test mailed) Patient would also like to discuss covid vaccine.  Virtual Visit via Video Note  I connected with Julie Swanson on 11/18/19 at  1:10 PM EST by a video enabled telemedicine application and verified that I am speaking with the correct person using two identifiers.  Location: Patient: home Provider: office   I discussed the limitations of evaluation and management by telemedicine and the availability of in person appointments. The patient expressed understanding and agreed to proceed.  History of Present Illness:    Observations/Objective:   Assessment and Plan:   Follow Up Instructions:    I discussed the assessment and treatment plan with the patient. The patient was provided an opportunity to ask questions and all were answered. The patient agreed with the plan and demonstrated an understanding of the instructions.   The patient was advised to call back or seek an in-person evaluation if the symptoms worsen or if the condition fails to improve as anticipated.  I provided 30 minutes of non-face-to-face time during this encounter.  30 minutes spent with the patient plus additional 10 minutes documenting and reviewing the chart Greater than half the time was spent discussing the multiple options regarding biopsy      Review of Systems    Constitutional: Negative for activity change and appetite change.  HENT: Negative for congestion and rhinorrhea.   Respiratory: Negative for cough and shortness of breath.   Cardiovascular: Negative for chest pain and leg swelling.  Gastrointestinal: Negative for abdominal pain, nausea and vomiting.  Skin: Negative for color change.  Neurological: Negative for dizziness and weakness.  Psychiatric/Behavioral: Negative for agitation and confusion.       Objective:   Physical Exam  Patient had virtual visit Appears to be in no distress Atraumatic Neuro able to relate and oriented No apparent resp distress Color normal       Assessment & Plan:  Breast mass-we did discuss benefits and risk of a biopsy in my opinion I believe the patient should do a biopsy she is hesitant she will think about this biopsy would give her more knowledge to help make a better choice regarding this issue and could preserve her quality of life longer patient hesitant to do any type of major surgery chemotherapy etc. because of her age We did discuss the benefits and risk of biopsy Biopsy would give her better information to make better choices in order to help preserve quality of life moving forward  Patient will be doing Covid vaccine soon  Hyperlipidemia check lab work

## 2019-11-19 NOTE — Addendum Note (Signed)
Addended by: Margaretha Sheffield on: 11/19/2019 08:48 AM   Modules accepted: Orders

## 2019-11-20 ENCOUNTER — Telehealth: Payer: Self-pay | Admitting: Family Medicine

## 2019-11-20 MED ORDER — TRIAMCINOLONE ACETONIDE 0.1 % EX CREA: TOPICAL_CREAM | CUTANEOUS | 0 refills | Status: DC

## 2019-11-20 NOTE — Telephone Encounter (Signed)
Pt had a phone visit 3/9 and thought Dr. Lorin Picket was sending in a cream for her finger to [facility].

## 2019-11-20 NOTE — Telephone Encounter (Signed)
Please advise. Thank you

## 2019-11-20 NOTE — Telephone Encounter (Signed)
Cream sent to pharmacy and pt is aware. Pt educated to stop utilizing Neosporin. Pt verbalized understanding.

## 2019-11-20 NOTE — Telephone Encounter (Signed)
1.  We advised her to stop utilizing the Neosporin 2.  May send in triamcinolone 0.1% applied twice daily as needed for up to 7 days if ongoing troubles please notify us small tube please

## 2019-11-24 ENCOUNTER — Ambulatory Visit: Payer: Medicare Other

## 2020-01-05 ENCOUNTER — Other Ambulatory Visit (HOSPITAL_COMMUNITY): Payer: Self-pay | Admitting: Cardiology

## 2020-02-02 ENCOUNTER — Telehealth: Payer: Self-pay | Admitting: Family Medicine

## 2020-02-02 NOTE — Telephone Encounter (Signed)
Disability Parking Form dropped off by daughter Darel Hong. To be completed by Dr. Lorin Picket. Form will be left in Forms box

## 2020-02-04 NOTE — Telephone Encounter (Signed)
Form was signed additional administrative portions need to be filled and then forwarded to the family thank you

## 2020-02-04 NOTE — Telephone Encounter (Signed)
Form is completed and up front for pickup

## 2020-03-09 ENCOUNTER — Other Ambulatory Visit (HOSPITAL_COMMUNITY): Payer: Self-pay | Admitting: Cardiology

## 2020-05-06 ENCOUNTER — Telehealth: Payer: Self-pay | Admitting: Family Medicine

## 2020-05-06 NOTE — Telephone Encounter (Signed)
Daughter called in because she said mom had a severe sore throat and couldn't eat or drink anything.  Hurts to talk.  Per nurse, patient was advised to go to urgent care today.  Daughter said she will absolutely not go to the urgent care.  They will only see Dr. Lorin Picket.  I told them that we advised urgent care or the ER if patient got worse.  They refused.  I put patient on the schedule for 3:30 tomorrow as a car patient but due to patients age I was not comfortable with this and wanted to put a message back just in case.

## 2020-05-06 NOTE — Telephone Encounter (Signed)
11:30 AM visit.  Patient should be driven to the back door.  If sore throat no other symptoms.  She may be shown to her room through the back door.

## 2020-05-07 ENCOUNTER — Ambulatory Visit (INDEPENDENT_AMBULATORY_CARE_PROVIDER_SITE_OTHER): Payer: Medicare Other | Admitting: Family Medicine

## 2020-05-07 ENCOUNTER — Encounter: Payer: Self-pay | Admitting: Family Medicine

## 2020-05-07 DIAGNOSIS — R590 Localized enlarged lymph nodes: Secondary | ICD-10-CM | POA: Diagnosis not present

## 2020-05-07 NOTE — Progress Notes (Signed)
   Subjective:    Patient ID: Julie Swanson, female    DOB: 1928-05-30, 86 y.o.   MRN: [id number]  Sore Throat  This is a new problem. Episode onset: Tuesday. The pain is severe. Pertinent negatives include no congestion, coughing, ear pain or shortness of breath. Treatments tried: Had dental work done last week. Dentist gave rx for Augmentin yesterday.   Patient had brain recent dental work on the left side that is where her tenderness is hurts when she swallows denies fever chills sweats   Review of Systems  Constitutional: Negative for activity change and fever.  HENT: Positive for sore throat. Negative for congestion, ear pain and rhinorrhea.   Eyes: Negative for discharge.  Respiratory: Negative for cough, shortness of breath and wheezing.   Cardiovascular: Negative for chest pain.       Objective:   Physical Exam mild tenderness along the left side lungs clear heart regular 18 T otherwise benign       Assessment & Plan:  Tender lymphadenopathy on the left side antibiotics indicated follow-up in 3 to 4 weeks for recheck throat follow-up sooner if any progressive troubles or problems

## 2020-05-07 NOTE — Telephone Encounter (Signed)
Daughter notified 

## 2020-05-14 ENCOUNTER — Telehealth: Payer: Self-pay | Admitting: Family Medicine

## 2020-05-14 MED ORDER — NYSTATIN 100000 UNIT/ML MT SUSP: 5.0000 mL | Freq: Four times a day (QID) | OROMUCOSAL | 0 refills | Status: DC

## 2020-05-14 NOTE — Telephone Encounter (Signed)
Spoke with daughter Risk analyst) who stated that she would like the med called in to have on hand since it is a holiday weekend but will only fill if not better or worse.

## 2020-05-14 NOTE — Telephone Encounter (Signed)
What antibiotic is it?  Dosage and how long? Thx. Dr. Ladona Ridgel

## 2020-05-14 NOTE — Telephone Encounter (Signed)
Could be thrush.  Hard to say w/o seeing it. If needed could call in nystatin swish and gargle.   If they want it pls send-  Nystatin- 100,000unit/ml susp. Swish, gargle, and spit 34ml qid, for 7 days. #112ml, no refill.   Thx, dr. Ladona Ridgel

## 2020-05-14 NOTE — Telephone Encounter (Signed)
Daughter states she was put Augmentin by specialist- she only has 2 days left and doing fine with that med-probiotics have really helped her but she still has tongue soreness- not as red today as yesterday(thinks from the antibiotic). Daughter states she was just calling for an update- her throat is doing better but still a little sore- eating and drinking good

## 2020-05-14 NOTE — Telephone Encounter (Signed)
Daughter Darel Hong is giving update on patient condition she has had two reaction to antibiotic medication . Her tongue is real red  And sore and 3 days of severe diarrhea but better. She spoke with pharmacist and they told her to take a probiotic and she is doing better. She still has some soreness in her throat and only two days of antibiotic left to take. Daughter is wondering is there something see can take for redness of he tongue. Please advise [phone]

## 2020-05-18 ENCOUNTER — Other Ambulatory Visit: Payer: Self-pay

## 2020-05-18 ENCOUNTER — Ambulatory Visit
Admission: RE | Admit: 2020-05-18 | Discharge: 2020-05-18 | Disposition: A | Discharge status: Home / Self Care | Payer: Medicare Other | Source: Ambulatory Visit | Attending: Family Medicine | Admitting: Family Medicine

## 2020-05-18 ENCOUNTER — Other Ambulatory Visit: Payer: Self-pay | Admitting: Family Medicine

## 2020-05-18 DIAGNOSIS — R928 Other abnormal and inconclusive findings on diagnostic imaging of breast: Secondary | ICD-10-CM

## 2020-05-28 ENCOUNTER — Ambulatory Visit (INDEPENDENT_AMBULATORY_CARE_PROVIDER_SITE_OTHER): Payer: Medicare Other | Admitting: Family Medicine

## 2020-05-28 ENCOUNTER — Encounter: Payer: Self-pay | Admitting: Family Medicine

## 2020-05-28 ENCOUNTER — Other Ambulatory Visit: Payer: Self-pay

## 2020-05-28 VITALS — BP 134/78 | HR 77 | Temp 96.8°F | Wt 147.6 lb

## 2020-05-28 DIAGNOSIS — R928 Other abnormal and inconclusive findings on diagnostic imaging of breast: Secondary | ICD-10-CM

## 2020-05-28 MED ORDER — FLUCONAZOLE 150 MG PO TABS: ORAL_TABLET | ORAL | 0 refills | Status: DC

## 2020-05-28 NOTE — Progress Notes (Signed)
   Subjective:    Patient ID: Julie Swanson, female    DOB: [birth year], 86 y.o.   MRN: [id number]  HPI Pt here for follow up. Pt states she is better. tongue still not better all the way but is better than before. Pt daughter states that dentist put pt on Augmentin and pt had severe diarrhea x3 days. Did finish antibiotics. Dentist also sent in Magic Mouthwash. Pt had follow up with dentist recently.  Patient does have to some degree of raw tongue In addition to all of this patient is having a difficult time with abnormal mammogram she does not want to have a biopsy of it she feels like it should just stay there and she feels like its not getting better and does not want to do anything with that at her age.  Her daughter comes in with her and is requesting that the patient be seen by oncology to get further opinion on this.  The patient is open to getting opinion but does not want to commit to any treatment currently. Fall Risk  11/18/2019 08/12/2018 12/12/2017 08/08/2017 04/02/2017  Falls in the past year? 0 1 No No No  Comment - - - - Emmi Telephone Survey: data to providers prior to load  Number falls in past yr: - 0 - - -  Injury with Fall? - 1 - - -  Follow up Falls evaluation completed Falls evaluation completed;Education provided - - -    Review of Systems See above    Objective:   Physical Exam  On physical exam I do not find any lymphadenopathy around the neck.  Lungs are clear no crackles heart regular extremities no edema skin warm dry      Assessment & Plan:  Abnormal mammogram Referral to oncology Patient would like to get their opinion about what could be done and also get their opinion that if she should do nothing what is the likely outcome  Patient finishing up with dentist regarding her mouth and tongue we will treat for the possibility of thrush

## 2020-06-03 ENCOUNTER — Encounter: Payer: Self-pay | Admitting: Family Medicine

## 2020-06-04 ENCOUNTER — Other Ambulatory Visit (HOSPITAL_COMMUNITY): Payer: Self-pay | Admitting: Cardiology

## 2020-06-04 MED ORDER — ATORVASTATIN CALCIUM 10 MG PO TABS
10.0000 mg | ORAL_TABLET | Freq: Every day | ORAL | 3 refills | Status: DC
Start: 2020-06-04 — End: 2021-07-25

## 2020-06-04 MED ORDER — APIXABAN 5 MG PO TABS: 5.0000 mg | ORAL_TABLET | Freq: Two times a day (BID) | ORAL | 3 refills | Status: DC

## 2020-06-04 MED ORDER — POTASSIUM CHLORIDE CRYS ER 20 MEQ PO TBCR: 20.0000 meq | EXTENDED_RELEASE_TABLET | Freq: Every day | ORAL | 6 refills | Status: DC

## 2020-06-04 MED ORDER — METOPROLOL SUCCINATE ER 50 MG PO TB24: ORAL_TABLET | ORAL | 3 refills | Status: DC

## 2020-06-04 MED ORDER — DILTIAZEM HCL ER COATED BEADS 240 MG PO CP24: 240.0000 mg | ORAL_CAPSULE | Freq: Every day | ORAL | 3 refills | Status: DC

## 2020-06-04 MED ORDER — FUROSEMIDE 40 MG PO TABS: 40.0000 mg | ORAL_TABLET | Freq: Two times a day (BID) | ORAL | 3 refills | Status: DC

## 2020-06-19 ENCOUNTER — Other Ambulatory Visit (INDEPENDENT_AMBULATORY_CARE_PROVIDER_SITE_OTHER): Payer: Medicare Other | Admitting: *Deleted

## 2020-06-19 DIAGNOSIS — Z23 Encounter for immunization: Secondary | ICD-10-CM

## 2020-08-27 ENCOUNTER — Encounter (HOSPITAL_COMMUNITY): Payer: Medicare Other | Admitting: Cardiology

## 2020-08-30 ENCOUNTER — Telehealth: Payer: Self-pay | Admitting: Family Medicine

## 2020-08-30 NOTE — Telephone Encounter (Signed)
Patient wants to know if you recommend she get her booster shot.She had her last shot in April. Please advise

## 2020-08-30 NOTE — Telephone Encounter (Signed)
Patient advised Dr Lorin Picket highly recommends this because of his strong risk of breakthrough infection with this omicron variant she should get booster or she can get this through most pharmacies such as [facility] etc. Patient verbalized understanding

## 2020-08-30 NOTE — Telephone Encounter (Signed)
I highly recommend this because of his strong risk of breakthrough infection with this omicron variant she should get booster or she can get this through most pharmacies such as [facility].

## 2020-09-08 ENCOUNTER — Ambulatory Visit (HOSPITAL_COMMUNITY)
Admission: RE | Admit: 2020-09-08 | Discharge: 2020-09-08 | Disposition: A | Discharge status: Home / Self Care | Payer: Medicare Other | Source: Ambulatory Visit | Attending: Cardiology | Admitting: Cardiology

## 2020-09-08 ENCOUNTER — Other Ambulatory Visit: Payer: Self-pay

## 2020-09-08 ENCOUNTER — Encounter (HOSPITAL_COMMUNITY): Payer: Self-pay | Admitting: Cardiology

## 2020-09-08 VITALS — BP 128/78 | HR 98 | Wt 151.8 lb

## 2020-09-08 DIAGNOSIS — I482 Chronic atrial fibrillation, unspecified: Secondary | ICD-10-CM | POA: Insufficient documentation

## 2020-09-08 DIAGNOSIS — Z8249 Family history of ischemic heart disease and other diseases of the circulatory system: Secondary | ICD-10-CM | POA: Diagnosis not present

## 2020-09-08 DIAGNOSIS — I5032 Chronic diastolic (congestive) heart failure: Secondary | ICD-10-CM | POA: Diagnosis not present

## 2020-09-08 DIAGNOSIS — Z79899 Other long term (current) drug therapy: Secondary | ICD-10-CM | POA: Insufficient documentation

## 2020-09-08 DIAGNOSIS — I4819 Other persistent atrial fibrillation: Secondary | ICD-10-CM

## 2020-09-08 DIAGNOSIS — I110 Hypertensive heart disease with heart failure: Secondary | ICD-10-CM | POA: Diagnosis present

## 2020-09-08 DIAGNOSIS — Z7901 Long term (current) use of anticoagulants: Secondary | ICD-10-CM | POA: Insufficient documentation

## 2020-09-08 DIAGNOSIS — E785 Hyperlipidemia, unspecified: Secondary | ICD-10-CM | POA: Diagnosis not present

## 2020-09-08 HISTORY — DX: Heart failure, unspecified: I50.9

## 2020-09-08 LAB — CBC
HCT: 38.2 % (ref 36.0–46.0)
Hemoglobin: 12.6 g/dL (ref 12.0–15.0)
MCH: 30.4 pg (ref 26.0–34.0)
MCHC: 33 g/dL (ref 30.0–36.0)
MCV: 92.3 fL (ref 80.0–100.0)
Platelets: 235 10*3/uL (ref 150–400)
RBC: 4.14 MIL/uL (ref 3.87–5.11)
RDW: 12.8 % (ref 11.5–15.5)
WBC: 6.4 10*3/uL (ref 4.0–10.5)
nRBC: 0 % (ref 0.0–0.2)

## 2020-09-08 LAB — BASIC METABOLIC PANEL
Anion gap: 10 (ref 5–15)
BUN: 15 mg/dL (ref 8–23)
CO2: 28 mmol/L (ref 22–32)
Calcium: 9.2 mg/dL (ref 8.9–10.3)
Chloride: 98 mmol/L (ref 98–111)
Creatinine, Ser: 0.96 mg/dL (ref 0.44–1.00)
GFR, Estimated: 56 mL/min — ABNORMAL LOW (ref >60)
Glucose, Bld: 215 mg/dL — ABNORMAL HIGH (ref 70–99)
Potassium: 3.9 mmol/L (ref 3.5–5.1)
Sodium: 136 mmol/L (ref 135–145)

## 2020-09-08 LAB — TSH: TSH: 3.147 u[IU]/mL (ref 0.350–4.500)

## 2020-09-08 LAB — LIPID PANEL
Cholesterol: 143 mg/dL (ref 0–200)
HDL: 32 mg/dL — ABNORMAL LOW (ref >40)
LDL Cholesterol: 81 mg/dL (ref 0–99)
Total CHOL/HDL Ratio: 4.5 RATIO
Triglycerides: 148 mg/dL (ref <150)
VLDL: 30 mg/dL (ref 0–40)

## 2020-09-08 NOTE — Patient Instructions (Addendum)
Labs done today. We will contact you only if your labs are abnormal.   No medication changes were made. Please continue all current medications as prescribed.  Your physician recommends that you schedule a follow-up appointment in: 6 months. Please contact our office in May 2022 to schedule a June 2022 appointment with Dr. Shirlee Latch.  If you have any questions or concerns before your next appointment please send [place] a message through [place] or call our office at [phone].    TO LEAVE A MESSAGE FOR THE NURSE SELECT OPTION 2, PLEASE LEAVE A MESSAGE INCLUDING: . YOUR NAME . DATE OF BIRTH . CALL BACK NUMBER . REASON FOR CALL**this is important as we prioritize the call backs  YOU WILL RECEIVE A CALL BACK THE SAME DAY AS LONG AS YOU CALL BEFORE 4:00 PM   At the [facility], you and your health needs are our priority. As part of our continuing mission to provide you with exceptional heart care, we have created designated Provider Care Teams. These Care Teams include your primary Cardiologist (physician) and Advanced Practice Providers (APPs- Physician Assistants and Nurse Practitioners) who all work together to provide you with the care you need, when you need it.   You may see any of the following providers on your designated Care Team at your next follow up: Marland Kitchen Dr Arvilla Meres . Dr Marca Ancona . Tonye Becket, NP . Robbie Lis, PA . Karle Plumber, PharmD   Please be sure to bring in all your medications bottles to every appointment.

## 2020-09-08 NOTE — Progress Notes (Signed)
Patient ID: Julie Swanson, female   DOB: 01-01-1928, 86 y.o.   MRN: [id number] PCP: Dr. Lilyan Punt Cardiology: Dr. Shirlee Latch  86 y.o. presents for followup of paroxysmal atrial fibrillation and diastolic CHF.  She had been having runs of rapid tachypalpitations when I saw her initially.  She was quite symptomatic with episodes.  Event monitor showed runs of atrial fibrillation with RVR.  I started her on Multaq for rhythm control given her significant symptoms and on apixaban.  She did not tolerate diltiazem due to ankle swelling and was switched over to Coreg for rate control.  She did not tolerate Coreg and is back on propranolol, which she does ok with.  Echo in 11/14 showed EF 60-65% with mild MR. No exertional chest pain or dyspnea. No lightheadedness.  She went into atrial fibrillation again in 8/16.  She had DCCV in 9/16 back to NSR and was continued on Multaq.  However, she went back into atrial fibrillation shortly after on despite Multaq use.  Multaq was stopped.  Given ongoing fatigue with atrial fibrillation, she was started on amiodarone.  DCCV was done in 12/16, she went back into NSR.  Propranolol was stopped due to bradycardia.  Unfortunately, atrial fibrillation has recurred.  She saw Tereso Newcomer and it was decided to try to reload her on amiodarone and attempt DCCV one more time.  Later in 12/16, I cardioverted her again while on amiodarone.  However, at 09/10/15 appt in atrial fibrillation clinic, she was back in atrial fibrillation.  She developed a tremor on amiodarone.  Amiodarone was stopped with plan to rate control/anticoagulate going forward.  She is now on Toprol XL and diltiazem CD.  Echo in 2/19 showed EF 60-65%, mild LVH, moderate TR, PASP 37 mmHg.   She had an abnormal mammogram but has not wanted to have a breast biopsy. She is willing to have serial mammograms.   She remains in atrial fibrillation, HR controlled. Weight is stable.  She tries to walk for 15 minutes/day.  No  chest pain.  No lightheadedness.  She does not feel palpitations.   ECG (personally reviewed): atrial fibrillation rate 79 with nonspecific T wave changes.    Labs (10/14): LDL 90, HDL 41, K 4.1, creatinine 0.9 Labs (4/15): LDL 74, HDL 40, K 4.1, creatinine 4.13 Labs (10/15): LDL 73, HDL 38 Labs (12/15): K 4, creatinine 0.9, HCT 36 Labs (8/16): K 4, creatinine 0.97, HCT 37.8, TSH normal Labs (9/16): K 4.6, creatinine 1.06, LDL 68, HDL 47 Labs (11/16): TSH normal, HCT 34.1, LFTs normal Labs (12/16): creatinine 0.96, HCT 35.9 Labs (5/17): K 4.1, creatinine 0.99, LDL 77, HDL 38 Labs (2/18): K 4, creatinine 1.05 Labs (4/18): BNP 321 Labs (5/18): K 3.9, creatinine 1.06, LDL 62 Labs (9/18): K 3.9, creatinine 1.04 Labs (2/19): hgb 12.9, K 3.9, creatinine 0.95 Labs (4/19): LDL 69, HDL 38 Labs (8/19): hgb 12.6, creatinine 0.98 Labs (9/19): K 3.8, creatinine 1.06, hgb 12.8 Labs (2/20): LDL 68, hgb 12.5 Labs (7/20): K 3.7, creatinine 0.97 Labs (10/20): K 3.9, creatinine 0.92, hgb 12.6  PMH: 1. LHC (8/10) with minimal nonobstructive disease.  ETT (10/13) with 2'53" exercise, nonspecific ECG changes with no evidence for ischemia.  2. Mitral valve prolapse: Echo (11/14) with EF 60-65%, mild MR.  3. Atrial fibrillation:  Event monitor (10/13) with PACs only.  Event monitor (11/14) showed atrial fibrillation with RVR. She does not tolerate metoprolol (nightmares).  She had ankle swelling with diltiazem. Possible fatigue/diarrhea with Coreg. Holter (  8/16) with persistent atrial fibrillation.  DCCV to NSR in 9/16.  Back in atrial fibrillation by 10/16 despite Multaq. DCCV to NSR x 2 in 12/16 on amiodarone, but atrial fibrillation recurred.  Amiodarone stopped. 4. HTN 5. Hyperlipidemia 6. Hysterectomy. 7. Carotid disease: Carotid dopplers (1/15) with prominent plaque, mild stenosis.  - Carotid dopplers (2/20): mild BICA stenosis.  8. Internal hemorrhoids with episodic rectal bleeding.  9. Macular  degeneration.  10. PFTs: Mild obstructive lung disease.  11. PAD: ABIs (2/17) were normal, but there was evidence for "developing fem-pop disease."  ABIs normal in 2/18.  12. Pancreatic cysts on CT.  13. Chronic diastolic CHF - Echo (2/19): EF 60-65%, mild LVH, moderate TR, PASP 37 mmHg.  14. H/o Lyme disease  SH: Widow, lives in [place], nonsmoker.  2 daughters.   FH: Mother with CVA, CAD.  Father with CAD.   ROS: All systems reviewed and negative except as per HPI.   Current Outpatient Medications  Medication Sig Dispense Refill  . acetaminophen (TYLENOL) 500 MG tablet Take 500 mg by mouth 2 (two) times daily as needed for moderate pain.    Marland Kitchen apixaban (ELIQUIS) 5 MG TABS tablet Take 1 tablet (5 mg total) by mouth 2 (two) times daily. 180 tablet 3  . atorvastatin (LIPITOR) 10 MG tablet Take 1 tablet (10 mg total) by mouth daily. 90 tablet 3  . cycloSPORINE (RESTASIS) 0.05 % ophthalmic emulsion Place 1 drop into both eyes daily.     Marland Kitchen diltiazem (CARDIZEM CD) 240 MG 24 hr capsule Take 1 capsule (240 mg total) by mouth daily. 90 capsule 3  . furosemide (LASIX) 40 MG tablet Take 1 tablet (40 mg total) by mouth 2 (two) times daily. 180 tablet 3  . Lutein 20 MG CAPS Take 1 capsule by mouth daily.    . Metoprolol Succinate 50 MG CS24 Take 50 mg by mouth 2 (two) times daily.    Marland Kitchen OVER THE COUNTER MEDICATION Med Name: BENE-FIBER Take two (2) teaspoons by mouth daily with 8 oz water.    . potassium chloride SA (KLOR-CON M20) 20 MEQ tablet Take 1 tablet (20 mEq total) by mouth daily. 90 tablet 6   No current facility-administered medications for this encounter.    BP 128/78   Pulse 98   Wt 68.9 kg (151 lb 12.8 oz)   SpO2 97%   BMI 26.06 kg/m  General: NAD Neck: No JVD, no thyromegaly or thyroid nodule.  Lungs: Clear to auscultation bilaterally with normal respiratory effort. CV: Nondisplaced PMI.  Heart irregular S1/S2, no S3/S4, no murmur.  1+ ankle edema.  No carotid bruit.  Normal  pedal pulses.  Abdomen: Soft, nontender, no hepatosplenomegaly, no distention.  Skin: Intact without lesions or rashes.  Neurologic: Alert and oriented x 3.  Psych: Normal affect. Extremities: No clubbing or cyanosis.  HEENT: Normal.   Assessment/Plan: 1. Atrial fibrillation:  Chronic, has recurred after DCCV in 12/16 on amiodarone.  She has failed amiodarone and Multaq.  She is not a good sotalol or Tikosyn candidate as QTc has been prolonged.  We have decided on following a rate control and anticoagulation strategy. HR is controlled. - Continue Toprol XL 50 mg bid and diltiazem CD 240 mg daily.    - Continue Eliquis. CBC today.   2. HTN: BP controlled.  3. Hyperlipidemia: Check lipids today.  4. Chronic diastolic CHF:  Chronic NYHA class II symptoms.  She does not look volume overloaded, no JVD.  She does have  peripheral edema that I think may be more due to venous insufficiency.   - Continue Lasix 40 mg bid, BMET today.     - She needs to wear compression stockings during the day.   Followup in 6 months    Marca Ancona 09/08/2020

## 2020-11-16 ENCOUNTER — Other Ambulatory Visit: Payer: Medicare Other

## 2020-11-16 ENCOUNTER — Other Ambulatory Visit: Payer: Self-pay | Admitting: Family Medicine

## 2020-11-16 ENCOUNTER — Ambulatory Visit
Admission: RE | Admit: 2020-11-16 | Discharge: 2020-11-16 | Disposition: A | Discharge status: Home / Self Care | Payer: Medicare Other | Source: Ambulatory Visit | Attending: Family Medicine | Admitting: Family Medicine

## 2020-11-16 ENCOUNTER — Other Ambulatory Visit: Payer: Self-pay

## 2020-11-16 DIAGNOSIS — R928 Other abnormal and inconclusive findings on diagnostic imaging of breast: Secondary | ICD-10-CM

## 2020-11-22 ENCOUNTER — Other Ambulatory Visit: Payer: Medicare Other

## 2020-11-25 ENCOUNTER — Other Ambulatory Visit: Payer: Self-pay

## 2020-11-25 ENCOUNTER — Ambulatory Visit (INDEPENDENT_AMBULATORY_CARE_PROVIDER_SITE_OTHER): Payer: Medicare Other | Admitting: Family Medicine

## 2020-11-25 ENCOUNTER — Encounter: Payer: Self-pay | Admitting: Family Medicine

## 2020-11-25 VITALS — BP 136/86 | HR 84 | Temp 96.5°F | Wt 152.6 lb

## 2020-11-25 DIAGNOSIS — R600 Localized edema: Secondary | ICD-10-CM | POA: Diagnosis not present

## 2020-11-25 DIAGNOSIS — R21 Rash and other nonspecific skin eruption: Secondary | ICD-10-CM | POA: Diagnosis not present

## 2020-11-25 DIAGNOSIS — I10 Essential (primary) hypertension: Secondary | ICD-10-CM | POA: Diagnosis not present

## 2020-11-25 DIAGNOSIS — R1900 Intra-abdominal and pelvic swelling, mass and lump, unspecified site: Secondary | ICD-10-CM

## 2020-11-25 DIAGNOSIS — M546 Pain in thoracic spine: Secondary | ICD-10-CM

## 2020-11-25 DIAGNOSIS — I4891 Unspecified atrial fibrillation: Secondary | ICD-10-CM | POA: Diagnosis not present

## 2020-11-25 MED ORDER — MOMETASONE FUROATE 0.1 % EX CREA: TOPICAL_CREAM | CUTANEOUS | 0 refills | Status: DC

## 2020-11-25 NOTE — Progress Notes (Signed)
   Subjective:    Patient ID: Julie Swanson, female    DOB: 05-18-1928, 87 y.o.   MRN: [id number]  HPI Pt here for follow up. Pt daughter has a few concerns. Abdominal swelling; leg swelling. Family has noticed abdominal swelling over the course of the past few months.  The patient states it is because she is not active and she is gaining weight.  She denies any abdominal pain.  She states her energy level doing okay. They have also noticed swelling in the legs that seems to be more prominent on some days.  Once again she is not as active as she used to be She does have a abnormal mammogram for which she for several years has refused to do a needle biopsy but never clearly states well but nonetheless it is her choice and we have currently laid out to her why she already consider getting a needle biopsy but once again she refuses  Pt has bee broken out in a rash on lower left leg. Has used otc cream and rx cream; Cerave seems to work best.    having some swelling in the legs and lower legs Swelling in the abdomen  Some doe denies PND Relates some back pain on the left side intermittent hurts her in certain positions and certain movements does not wake her up at night  Review of Systems  Constitutional: Negative for activity change and appetite change.  HENT: Negative for congestion and rhinorrhea.   Respiratory: Negative for cough and shortness of breath.   Cardiovascular: Negative for chest pain and leg swelling.  Gastrointestinal: Negative for abdominal pain, nausea and vomiting.  Skin: Negative for color change.  Neurological: Negative for dizziness and weakness.  Psychiatric/Behavioral: Negative for agitation and confusion.       Objective:   Physical Exam  Lungs are clear heart regular pulse normal 1+ edema in the lower legs rash noted on the left leg contact dermatitis versus vascular dermatitis Abdomen is soft obese I do not feel with edema cannot rule this out Subjective  discomfort in the left mid back but not on the spine could be referred from the spine     Assessment & Plan:  1. HYPERTENSION, BENIGN Blood pressure decent control continue current measures  2. Atrial fibrillation, unspecified type (HCC) Heart rate controlled today continue blood thinner stay away from anti-inflammatories  3. Pedal edema Continue Lasix monitor closely follow-up within 6 weeks  4. Rash Elocon applied twice daily for up to 14 days  5. Thoracic spine pain X-ray indicated more than likely musculoskeletal causing pain to radiate into the left mid back - DG Thoracic Spine 4V  6. Abdominal swelling Find no evidence of any tumor but need to rule out ascites ultrasound ordered await results - US Abdomen Complete Finally we did discuss the abnormal mammogram but it is unlikely that she will do anything about it but the door is open we will help set her up with the breast center if she should so choose to get biopsy

## 2020-11-26 ENCOUNTER — Telehealth: Payer: Self-pay | Admitting: Family Medicine

## 2020-12-02 ENCOUNTER — Other Ambulatory Visit: Payer: Self-pay | Admitting: *Deleted

## 2020-12-02 ENCOUNTER — Ambulatory Visit (HOSPITAL_COMMUNITY)
Admission: RE | Admit: 2020-12-02 | Discharge: 2020-12-02 | Disposition: A | Discharge status: Home / Self Care | Payer: Medicare Other | Source: Ambulatory Visit | Attending: Family Medicine | Admitting: Family Medicine

## 2020-12-02 ENCOUNTER — Other Ambulatory Visit: Payer: Self-pay

## 2020-12-02 DIAGNOSIS — R1900 Intra-abdominal and pelvic swelling, mass and lump, unspecified site: Secondary | ICD-10-CM | POA: Diagnosis present

## 2020-12-02 DIAGNOSIS — M546 Pain in thoracic spine: Secondary | ICD-10-CM | POA: Insufficient documentation

## 2020-12-07 ENCOUNTER — Telehealth (HOSPITAL_COMMUNITY): Payer: Self-pay | Admitting: *Deleted

## 2020-12-07 NOTE — Telephone Encounter (Signed)
Pts daughter Darel Hong called stating pt has to have tooth extracted and she wants to know how long pt needs to hold eliquis.   Routed to Dr.McLean for advice

## 2020-12-07 NOTE — Telephone Encounter (Signed)
Left detailed VM for [name].

## 2020-12-07 NOTE — Telephone Encounter (Signed)
Ask the dentist.  If the dentist wants it held, hold for the day before and day of.  Start back the next day.

## 2020-12-30 HISTORY — PX: TOOTH EXTRACTION: SUR596

## 2021-01-04 ENCOUNTER — Encounter (HOSPITAL_COMMUNITY): Payer: Self-pay | Admitting: Cardiology

## 2021-01-04 ENCOUNTER — Other Ambulatory Visit: Payer: Self-pay

## 2021-01-04 ENCOUNTER — Ambulatory Visit (HOSPITAL_COMMUNITY)
Admission: RE | Admit: 2021-01-04 | Discharge: 2021-01-04 | Disposition: A | Discharge status: Home / Self Care | Payer: Medicare Other | Source: Ambulatory Visit | Attending: Cardiology | Admitting: Cardiology

## 2021-01-04 VITALS — BP 110/70 | HR 77 | Wt 151.2 lb

## 2021-01-04 DIAGNOSIS — I5032 Chronic diastolic (congestive) heart failure: Secondary | ICD-10-CM | POA: Diagnosis not present

## 2021-01-04 DIAGNOSIS — E785 Hyperlipidemia, unspecified: Secondary | ICD-10-CM | POA: Diagnosis not present

## 2021-01-04 DIAGNOSIS — Z8249 Family history of ischemic heart disease and other diseases of the circulatory system: Secondary | ICD-10-CM | POA: Diagnosis not present

## 2021-01-04 DIAGNOSIS — I110 Hypertensive heart disease with heart failure: Secondary | ICD-10-CM | POA: Insufficient documentation

## 2021-01-04 DIAGNOSIS — I482 Chronic atrial fibrillation, unspecified: Secondary | ICD-10-CM | POA: Insufficient documentation

## 2021-01-04 DIAGNOSIS — Z79899 Other long term (current) drug therapy: Secondary | ICD-10-CM | POA: Insufficient documentation

## 2021-01-04 DIAGNOSIS — Z7901 Long term (current) use of anticoagulants: Secondary | ICD-10-CM | POA: Insufficient documentation

## 2021-01-04 LAB — BASIC METABOLIC PANEL
Anion gap: 6 (ref 5–15)
BUN: 12 mg/dL (ref 8–23)
CO2: 29 mmol/L (ref 22–32)
Calcium: 9 mg/dL (ref 8.9–10.3)
Chloride: 100 mmol/L (ref 98–111)
Creatinine, Ser: 1.02 mg/dL — ABNORMAL HIGH (ref 0.44–1.00)
GFR, Estimated: 52 mL/min — ABNORMAL LOW (ref >60)
Glucose, Bld: 207 mg/dL — ABNORMAL HIGH (ref 70–99)
Potassium: 4 mmol/L (ref 3.5–5.1)
Sodium: 135 mmol/L (ref 135–145)

## 2021-01-04 LAB — BRAIN NATRIURETIC PEPTIDE: B Natriuretic Peptide: 403.1 pg/mL — ABNORMAL HIGH (ref 0.0–100.0)

## 2021-01-04 MED ORDER — POTASSIUM CHLORIDE CRYS ER 20 MEQ PO TBCR: 20.0000 meq | EXTENDED_RELEASE_TABLET | Freq: Two times a day (BID) | ORAL | 3 refills | Status: DC

## 2021-01-04 MED ORDER — FUROSEMIDE 40 MG PO TABS: 60.0000 mg | ORAL_TABLET | Freq: Two times a day (BID) | ORAL | 3 refills | Status: DC

## 2021-01-04 NOTE — Patient Instructions (Signed)
Labs done today. We will contact you only if your labs are abnormal.  INCREASE Potassium to 20mg  (1 tablet) by mouth 2 times daily.  INCREASE Lasix to 60mg  (1 & 1/2 tablets) by mouth 2 times daily.  No other medication changes were made. Please continue all current medications as prescribed.  Cut back on the sodium in your diet.   Your physician recommends that you schedule a follow-up appointment in: 10 days for a lab only appointment and in 6 weeks with our [facility] here in our office.   If you have any questions or concerns before your next appointment please send a message through [place] or call our office at [phone].    TO LEAVE A MESSAGE FOR THE NURSE SELECT OPTION 2, PLEASE LEAVE A MESSAGE INCLUDING: . YOUR NAME . DATE OF BIRTH . CALL BACK NUMBER . REASON FOR CALL**this is important as we prioritize the call backs  YOU WILL RECEIVE A CALL BACK THE SAME DAY AS LONG AS YOU CALL BEFORE 4:00 PM   Do the following things EVERYDAY: 1) Weigh yourself in the morning before breakfast. Write it down and keep it in a log. 2) Take your medicines as prescribed 3) Eat low salt foods--Limit salt (sodium) to 2000 mg per day.  4) Stay as active as you can everyday 5) Limit all fluids for the day to less than 2 liters   At [facility], you and your health needs are our priority. As part of our continuing mission to provide you with exceptional heart care, we have created designated Provider Care Teams. These Care Teams include your primary Cardiologist (physician) and Advanced Practice Providers (APPs- Physician Assistants and Nurse Practitioners) who all work together to provide you with the care you need, when you need it.   You may see any of the following providers on your designated Care Team at your next follow up: [address] [phone] . Dr Marland Kitchen . Arvilla Meres, NP . Marca Ancona, PA . Tonye Becket, PharmD   Please be sure to bring in all  your medications bottles to every appointment.     Cooking With Less Salt Cooking with less salt is one way to reduce the amount of sodium you get from food. Sodium is one of the elements that make up salt. It is found naturally in foods and is also added to certain foods. Depending on your condition and overall health, your health care provider or dietitian may recommend that you reduce your sodium intake. Most people should have less than 2,300 milligrams (mg) of sodium each day. If you have high blood pressure (hypertension), you may need to limit your sodium to 1,500 mg each day. Follow the tips below to help reduce your sodium intake. What are tips for eating less sodium? Reading food labels  Check the food label before buying or using packaged ingredients. Always check the label for the serving size and sodium content.  Look for products with no more than 140 mg of sodium in one serving.  Check the % Daily Value column to see what percent of the daily recommended amount of sodium is provided in one serving of the product. Foods with 5% or less in this column are considered low in sodium. Foods with 20% or higher are considered high in sodium.  Do not choose foods with salt as one of the first three ingredients on the ingredients list. If salt is one of the first three  ingredients, it usually means the item is high in sodium.   Shopping  Buy sodium-free or low-sodium products. Look for the following words on food labels: ? Low-sodium. ? Sodium-free. ? Reduced-sodium. ? No salt added. ? Unsalted.  Always check the sodium content even if foods are labeled as low-sodium or no salt added.  Buy fresh foods. Cooking  Use herbs, seasonings without salt, and spices as substitutes for salt.  Use sodium-free baking soda when baking.  Grill, braise, or roast foods to add flavor with less salt.  Avoid adding salt to pasta, rice, or hot cereals.  Drain and rinse canned vegetables,  beans, and meat before use.  Avoid adding salt when cooking sweets and desserts.  Cook with low-sodium ingredients. What foods are high in sodium? Vegetables Regular canned vegetables (not low-sodium or reduced-sodium). Sauerkraut, pickled vegetables, and relishes. Olives. [place] fries. Onion rings. Regular canned tomato sauce and paste. Regular tomato and vegetable juice. Frozen vegetables in sauces. Grains Instant hot cereals. Bread stuffing, pancake, and biscuit mixes. Croutons. Seasoned rice or pasta mixes. Noodle soup cups. Boxed or frozen macaroni and cheese. Regular salted crackers. Self-rising flour. Rolls. Bagels. Flour tortillas and wraps. Meats and other proteins Meat or fish that is salted, canned, smoked, cured, spiced, or pickled. This includes bacon, ham, sausages, hot dogs, corned beef, chipped beef, meat loaves, salt pork, jerky, pickled herring, anchovies, regular canned tuna, and sardines. Salted nuts. Dairy Processed cheese and cheese spreads. Cheese curds. Blue cheese. Feta cheese. String cheese. Regular cottage cheese. Buttermilk. Canned milk. The items listed above may not be a complete list of foods high in sodium. Actual amounts of sodium may be different depending on processing. Contact a dietitian for more information. What foods are low in sodium? Fruits Fresh, frozen, or canned fruit with no sauce added. Fruit juice. Vegetables Fresh or frozen vegetables with no sauce added. "No salt added" canned vegetables. "No salt added" tomato sauce and paste. Low-sodium or reduced-sodium tomato and vegetable juice. Grains Noodles, pasta, quinoa, rice. Shredded or puffed wheat or puffed rice. Regular or quick oats (not instant). Low-sodium crackers. Low-sodium bread. Whole-grain bread and whole-grain pasta. Unsalted popcorn. Meats and other proteins Fresh or frozen whole meats, poultry (not injected with sodium), and fish with no sauce added. Unsalted nuts. Dried peas, beans,  and lentils without added salt. Unsalted canned beans. Eggs. Unsalted nut butters. Low-sodium canned tuna or chicken. Dairy Milk. Soy milk. Yogurt. Low-sodium cheeses, such as Swiss, [address], [place], and [employer]. Sherbet or ice cream (keep to  cup per serving). Cream cheese. Fats and oils Unsalted butter or margarine. Other foods Homemade pudding. Sodium-free baking soda and baking powder. Herbs and spices. Low-sodium seasoning mixes. Beverages Coffee and tea. Carbonated beverages. The items listed above may not be a complete list of foods low in sodium. Actual amounts of sodium may be different depending on processing. Contact a dietitian for more information. What are some salt alternatives when cooking? The following are herbs, seasonings, and spices that can be used instead of salt to flavor your food. Herbs should be fresh or dried. Do not choose packaged mixes. Next to the name of the herb, spice, or seasoning are some examples of foods you can pair it with. Herbs  Bay leaves - Soups, meat and vegetable dishes, and spaghetti sauce.  Basil - [employer], soups, pasta, and fish dishes.  Cilantro - Meat, poultry, and vegetable dishes.  Chili powder - Marinades and Mexican dishes.  Chives - Salad  dressings and potato dishes.  Cumin - Mexican dishes, couscous, and meat dishes.  Dill - Fish dishes, sauces, and salads.  Fennel - Meat and vegetable dishes, breads, and cookies.  Garlic (do not use garlic salt) - [place] dishes, meat dishes, salad dressings, and sauces.  Marjoram - Soups, potato dishes, and meat dishes.  Oregano - Pizza and spaghetti sauce.  Parsley - Salads, soups, pasta, and meat dishes.  Rosemary - [place] dishes, salad dressings, soups, and red meats.  Saffron - Fish dishes, pasta, and some poultry dishes.  Sage - Stuffings and sauces.  Tarragon - Fish and [employer].  Thyme - Stuffing, meat, and fish dishes. Seasonings  Lemon juice  - Fish dishes, poultry dishes, vegetables, and salads.  Vinegar - Salad dressings, vegetables, and fish dishes. Spices  Cinnamon - Sweet dishes, such as cakes, cookies, and puddings.  Cloves - Gingerbread, puddings, and marinades for meats.  Curry - Vegetable dishes, fish and poultry dishes, and stir-fry dishes.  Ginger - Vegetable dishes, fish dishes, and stir-fry dishes.  Nutmeg - Pasta, vegetables, poultry, fish dishes, and custard. Summary  Cooking with less salt is one way to reduce the amount of sodium that you get from food.  Buy sodium-free or low-sodium products.  Check the food label before using or buying packaged ingredients.  Use herbs, seasonings without salt, and spices as substitutes for salt in foods. This information is not intended to replace advice given to you by your health care provider. Make sure you discuss any questions you have with your health care provider. Document Revised: 08/20/2019 Document Reviewed: 08/20/2019 Elsevier Patient Education  2021 [employer].

## 2021-01-04 NOTE — Progress Notes (Signed)
Medication Samples have been provided to the patient.  Drug name: Eliquis       Strength: 5        Qty: 4  LOT: [id number], [id number]  Exp.Date: 04/24,03/2023  Dosing instructions: 1 tab po bid  The patient has been instructed regarding the correct time, dose, and frequency of taking this medication, including desired effects and most common side effects.   Alyvia Derk R Clyde Upshaw 2:47 PM 01/04/2021

## 2021-01-05 NOTE — Progress Notes (Signed)
Patient ID: Julie Swanson, female   DOB: 1928-05-17, 87 y.o.   MRN: [id number] PCP: Dr. Lilyan Punt Cardiology: Dr. Shirlee Latch  87 y.o. presents for followup of paroxysmal atrial fibrillation and diastolic CHF.  She had been having runs of rapid tachypalpitations when I saw her initially.  She was quite symptomatic with episodes.  Event monitor showed runs of atrial fibrillation with RVR.  I started her on Multaq for rhythm control given her significant symptoms and on apixaban.  She did not tolerate diltiazem due to ankle swelling and was switched over to Coreg for rate control.  She did not tolerate Coreg and is back on propranolol, which she does ok with.  Echo in 11/14 showed EF 60-65% with mild MR. No exertional chest pain or dyspnea. No lightheadedness.  She went into atrial fibrillation again in 8/16.  She had DCCV in 9/16 back to NSR and was continued on Multaq.  However, she went back into atrial fibrillation shortly after on despite Multaq use.  Multaq was stopped.  Given ongoing fatigue with atrial fibrillation, she was started on amiodarone.  DCCV was done in 12/16, she went back into NSR.  Propranolol was stopped due to bradycardia.  Unfortunately, atrial fibrillation has recurred.  She saw Tereso Newcomer and it was decided to try to reload her on amiodarone and attempt DCCV one more time.  Later in 12/16, I cardioverted her again while on amiodarone.  However, at 09/10/15 appt in atrial fibrillation clinic, she was back in atrial fibrillation.  She developed a tremor on amiodarone.  Amiodarone was stopped with plan to rate control/anticoagulate going forward.  She is now on Toprol XL and diltiazem CD.  Echo in 2/19 showed EF 60-65%, mild LVH, moderate TR, PASP 37 mmHg.   She had an abnormal mammogram but has not wanted to have a breast biopsy. She is willing to have serial mammograms.   She remains in atrial fibrillation, HR controlled. Weight is stable.  Stable breathing, walks outside for 18  minutes on most days without problems.  She is short of breath walking a long distance.  No chest pain.  No lightheadedness.  No orthopnea/PND.   Labs (10/14): LDL 90, HDL 41, K 4.1, creatinine 0.9 Labs (4/15): LDL 74, HDL 40, K 4.1, creatinine 5.45 Labs (10/15): LDL 73, HDL 38 Labs (12/15): K 4, creatinine 0.9, HCT 36 Labs (8/16): K 4, creatinine 0.97, HCT 37.8, TSH normal Labs (9/16): K 4.6, creatinine 1.06, LDL 68, HDL 47 Labs (11/16): TSH normal, HCT 34.1, LFTs normal Labs (12/16): creatinine 0.96, HCT 35.9 Labs (5/17): K 4.1, creatinine 0.99, LDL 77, HDL 38 Labs (2/18): K 4, creatinine 1.05 Labs (4/18): BNP 321 Labs (5/18): K 3.9, creatinine 1.06, LDL 62 Labs (9/18): K 3.9, creatinine 1.04 Labs (2/19): hgb 12.9, K 3.9, creatinine 0.95 Labs (4/19): LDL 69, HDL 38 Labs (8/19): hgb 12.6, creatinine 0.98 Labs (9/19): K 3.8, creatinine 1.06, hgb 12.8 Labs (2/20): LDL 68, hgb 12.5 Labs (7/20): K 3.7, creatinine 0.97 Labs (10/20): K 3.9, creatinine 0.92, hgb 12.6 Labs (12/21): LDL 81, HDL 32, K 3.9, creatinine 6.25  PMH: 1. LHC (8/10) with minimal nonobstructive disease.  ETT (10/13) with 2'53" exercise, nonspecific ECG changes with no evidence for ischemia.  2. Mitral valve prolapse: Echo (11/14) with EF 60-65%, mild MR.  3. Atrial fibrillation:  Event monitor (10/13) with PACs only.  Event monitor (11/14) showed atrial fibrillation with RVR. She does not tolerate metoprolol (nightmares).  She had ankle swelling  with diltiazem. Possible fatigue/diarrhea with Coreg. Holter (8/16) with persistent atrial fibrillation.  DCCV to NSR in 9/16.  Back in atrial fibrillation by 10/16 despite Multaq. DCCV to NSR x 2 in 12/16 on amiodarone, but atrial fibrillation recurred.  Amiodarone stopped. 4. HTN 5. Hyperlipidemia 6. Hysterectomy. 7. Carotid disease: Carotid dopplers (1/15) with prominent plaque, mild stenosis.  - Carotid dopplers (2/20): mild BICA stenosis.  8. Internal hemorrhoids with  episodic rectal bleeding.  9. Macular degeneration.  10. PFTs: Mild obstructive lung disease.  11. PAD: ABIs (2/17) were normal, but there was evidence for "developing fem-pop disease."  ABIs normal in 2/18.  12. Pancreatic cysts on CT.  13. Chronic diastolic CHF - Echo (2/19): EF 60-65%, mild LVH, moderate TR, PASP 37 mmHg.  14. H/o Lyme disease  SH: Widow, lives in [place], nonsmoker.  2 daughters.   FH: Mother with CVA, CAD.  Father with CAD.   ROS: All systems reviewed and negative except as per HPI.   Current Outpatient Medications  Medication Sig Dispense Refill  . acetaminophen (TYLENOL) 500 MG tablet Take 500 mg by mouth 2 (two) times daily as needed for moderate pain.    Marland Kitchen apixaban (ELIQUIS) 5 MG TABS tablet Take 1 tablet (5 mg total) by mouth 2 (two) times daily. 180 tablet 3  . atorvastatin (LIPITOR) 10 MG tablet Take 1 tablet (10 mg total) by mouth daily. 90 tablet 3  . cycloSPORINE (RESTASIS) 0.05 % ophthalmic emulsion Place 1 drop into both eyes daily.     Marland Kitchen diltiazem (CARDIZEM CD) 240 MG 24 hr capsule Take 1 capsule (240 mg total) by mouth daily. 90 capsule 3  . Lutein 20 MG CAPS Take 1 capsule by mouth daily.    . Metoprolol Succinate 50 MG CS24 Take 50 mg by mouth 2 (two) times daily.    . mometasone (ELOCON) 0.1 % cream Apply BID prn to rash 45 g 0  . OVER THE COUNTER MEDICATION Med Name: BENE-FIBER Take two (2) teaspoons by mouth daily with 8 oz water.    . Probiotic Product (PROBIOTIC ADVANCED PO) Take by mouth. Once per day    . furosemide (LASIX) 40 MG tablet Take 1.5 tablets (60 mg total) by mouth 2 (two) times daily. 270 tablet 3  . potassium chloride SA (KLOR-CON M20) 20 MEQ tablet Take 1 tablet (20 mEq total) by mouth 2 (two) times daily. 180 tablet 3   No current facility-administered medications for this encounter.    BP 110/70   Pulse 77   Wt 68.6 kg (151 lb 3.2 oz)   SpO2 97%   BMI 25.95 kg/m  General: NAD Neck: JVP 8-9 cm with HJR, no  thyromegaly or thyroid nodule.  Lungs: Clear to auscultation bilaterally with normal respiratory effort. CV: Nondisplaced PMI.  Heart irregular S1/S2, no S3/S4, no murmur.  1+ edema 1/2 to knees bilaterally.  No carotid bruit.  Normal pedal pulses.  Abdomen: Soft, nontender, no hepatosplenomegaly, no distention.  Skin: Intact without lesions or rashes.  Neurologic: Alert and oriented x 3.  Psych: Normal affect. Extremities: No clubbing or cyanosis.  HEENT: Normal.   Assessment/Plan: 1. Atrial fibrillation:  Chronic, has recurred after DCCV in 12/16 on amiodarone.  She has failed amiodarone and Multaq.  She is not a good sotalol or Tikosyn candidate as QTc has been prolonged.  We have decided on following a rate control and anticoagulation strategy. HR is controlled. - Continue Toprol XL 50 mg bid and diltiazem CD 240  mg daily.    - Continue Eliquis.  2. HTN: BP controlled.  3. Hyperlipidemia: Good lipids 12/21.  4. Chronic diastolic CHF:  Chronic NYHA class II-III symptoms.  She is volume overloaded on exam today.  - Increase Lasix to 60 mg bid, BMET/BNP today and BMET in 10 days. Increase KCl to 20 bid.     - She needs to wear compression stockings during the day.   Followup in 6 wks with NP for reassessment of volume.   Marca Ancona 01/05/2021

## 2021-01-07 ENCOUNTER — Encounter: Payer: Self-pay | Admitting: Family Medicine

## 2021-01-07 ENCOUNTER — Other Ambulatory Visit: Payer: Self-pay

## 2021-01-07 ENCOUNTER — Ambulatory Visit (INDEPENDENT_AMBULATORY_CARE_PROVIDER_SITE_OTHER): Payer: Medicare Other | Admitting: Family Medicine

## 2021-01-07 VITALS — BP 122/82 | Temp 92.3°F | Ht 64.0 in | Wt 152.6 lb

## 2021-01-07 DIAGNOSIS — J438 Other emphysema: Secondary | ICD-10-CM

## 2021-01-07 DIAGNOSIS — I5032 Chronic diastolic (congestive) heart failure: Secondary | ICD-10-CM | POA: Diagnosis not present

## 2021-01-07 DIAGNOSIS — I10 Essential (primary) hypertension: Secondary | ICD-10-CM | POA: Diagnosis not present

## 2021-01-07 DIAGNOSIS — I4891 Unspecified atrial fibrillation: Secondary | ICD-10-CM

## 2021-01-07 NOTE — Progress Notes (Signed)
   Subjective:    Patient ID: Julie Swanson, female    DOB: 1928/03/26, 87 y.o.   MRN: [id number]  HPI Pt here with daughter for follow up. Pt had some pitting edema in abdomen. Pt had ultrasound of abdomen and thoracic spine xray. Pt seen cardiologist Tuesday and they increased lasix and potassium. Thursday pt had bottom (bottom left execration) permanent tooth extracted by Dr. Sunny Schlein. Pt daughter states "her tongue got jacked up". Daughter has noticed that each time after dental work her tongue is messed up. Pt has been taking Tylenol for it for 8 days.   HYPERTENSION, BENIGN  Atrial fibrillation, unspecified type (HCC)  Chronic diastolic heart failure (HCC)  Other emphysema (HCC), Chronic     Review of Systems     Objective:   Physical Exam Vitals reviewed.  Constitutional:      General: She is not in acute distress. HENT:     Head: Normocephalic and atraumatic.  Eyes:     General:        Right eye: No discharge.        Left eye: No discharge.  Neck:     Trachea: No tracheal deviation.  Cardiovascular:     Rate and Rhythm: Normal rate. Rhythm irregular.     Heart sounds: Normal heart sounds. No murmur heard.   Pulmonary:     Effort: Pulmonary effort is normal. No respiratory distress.     Breath sounds: Normal breath sounds.  Lymphadenopathy:     Cervical: No cervical adenopathy.  Skin:    General: Skin is warm and dry.  Neurological:     Mental Status: She is alert.     Coordination: Coordination normal.  Psychiatric:        Behavior: Behavior normal.           Assessment & Plan:  1. HYPERTENSION, BENIGN Blood pressure good control continue current measures  2. Atrial fibrillation, unspecified type (HCC) Heart rate under good control.  Tolerating Eliquis fine without bleeding issues  3. Chronic diastolic heart failure (HCC) Heart failure under good control currently recent notes from cardiologist reviewed  4. Other emphysema (HCC) Patient does  get short of breath with activity but I do not feel it is bad enough to warrant any type of inhalers currently we did talk about regular physical activity to keep her independence   Repeat recheck with him 4 months

## 2021-01-20 ENCOUNTER — Other Ambulatory Visit: Payer: Self-pay | Admitting: Cardiology

## 2021-01-21 LAB — BASIC METABOLIC PANEL
BUN/Creatinine Ratio: 13 (ref 12–28)
BUN: 15 mg/dL (ref 10–36)
CO2: 23 mmol/L (ref 20–29)
Calcium: 8.9 mg/dL (ref 8.7–10.3)
Chloride: 93 mmol/L — ABNORMAL LOW (ref 96–106)
Creatinine, Ser: 1.17 mg/dL — ABNORMAL HIGH (ref 0.57–1.00)
Glucose: 290 mg/dL — ABNORMAL HIGH (ref 65–99)
Potassium: 4 mmol/L (ref 3.5–5.2)
Sodium: 134 mmol/L (ref 134–144)
eGFR: 44 mL/min/{1.73_m2} — ABNORMAL LOW (ref >59)

## 2021-01-21 LAB — SPECIMEN STATUS REPORT

## 2021-01-25 ENCOUNTER — Other Ambulatory Visit: Payer: Self-pay | Admitting: Family Medicine

## 2021-01-25 DIAGNOSIS — R739 Hyperglycemia, unspecified: Secondary | ICD-10-CM

## 2021-01-29 LAB — GLUCOSE, RANDOM: Glucose: 184 mg/dL — ABNORMAL HIGH (ref 65–99)

## 2021-01-29 LAB — HEMOGLOBIN A1C
Est. average glucose Bld gHb Est-mCnc: 240 mg/dL
Hgb A1c MFr Bld: 10 % — ABNORMAL HIGH (ref 4.8–5.6)

## 2021-02-01 ENCOUNTER — Other Ambulatory Visit: Payer: Self-pay | Admitting: *Deleted

## 2021-02-01 MED ORDER — SITAGLIPTIN PHOSPHATE 50 MG PO TABS: 50.0000 mg | ORAL_TABLET | Freq: Every day | ORAL | 3 refills | Status: DC

## 2021-02-03 ENCOUNTER — Telehealth (HOSPITAL_COMMUNITY): Payer: Self-pay

## 2021-02-03 NOTE — Telephone Encounter (Signed)
Patient called wanting to know her lab results from her last ov with Dr. Shirlee Latch, patient advised and verbalized understanding. A copy of her lab results were sent to the address on file which was also confirmed by th patient.

## 2021-02-09 ENCOUNTER — Other Ambulatory Visit (HOSPITAL_COMMUNITY): Payer: Self-pay | Admitting: Cardiology

## 2021-02-15 ENCOUNTER — Other Ambulatory Visit: Payer: Self-pay

## 2021-02-15 ENCOUNTER — Ambulatory Visit (HOSPITAL_COMMUNITY)
Admission: RE | Admit: 2021-02-15 | Discharge: 2021-02-15 | Disposition: A | Discharge status: Home / Self Care | Payer: Medicare Other | Source: Ambulatory Visit | Attending: Cardiology | Admitting: Cardiology

## 2021-02-15 ENCOUNTER — Encounter (HOSPITAL_COMMUNITY): Payer: Self-pay

## 2021-02-15 VITALS — BP 110/80 | HR 83 | Wt 154.0 lb

## 2021-02-15 DIAGNOSIS — I5032 Chronic diastolic (congestive) heart failure: Secondary | ICD-10-CM | POA: Insufficient documentation

## 2021-02-15 DIAGNOSIS — I110 Hypertensive heart disease with heart failure: Secondary | ICD-10-CM | POA: Insufficient documentation

## 2021-02-15 DIAGNOSIS — Z7901 Long term (current) use of anticoagulants: Secondary | ICD-10-CM | POA: Diagnosis not present

## 2021-02-15 DIAGNOSIS — E785 Hyperlipidemia, unspecified: Secondary | ICD-10-CM | POA: Diagnosis not present

## 2021-02-15 DIAGNOSIS — Z7984 Long term (current) use of oral hypoglycemic drugs: Secondary | ICD-10-CM | POA: Diagnosis not present

## 2021-02-15 DIAGNOSIS — I482 Chronic atrial fibrillation, unspecified: Secondary | ICD-10-CM | POA: Insufficient documentation

## 2021-02-15 DIAGNOSIS — Z8249 Family history of ischemic heart disease and other diseases of the circulatory system: Secondary | ICD-10-CM | POA: Insufficient documentation

## 2021-02-15 DIAGNOSIS — E119 Type 2 diabetes mellitus without complications: Secondary | ICD-10-CM | POA: Insufficient documentation

## 2021-02-15 DIAGNOSIS — Z79899 Other long term (current) drug therapy: Secondary | ICD-10-CM | POA: Diagnosis not present

## 2021-02-15 LAB — BASIC METABOLIC PANEL
Anion gap: 11 (ref 5–15)
BUN: 20 mg/dL (ref 8–23)
CO2: 25 mmol/L (ref 22–32)
Calcium: 9.1 mg/dL (ref 8.9–10.3)
Chloride: 100 mmol/L (ref 98–111)
Creatinine, Ser: 1.03 mg/dL — ABNORMAL HIGH (ref 0.44–1.00)
GFR, Estimated: 51 mL/min — ABNORMAL LOW (ref >60)
Glucose, Bld: 147 mg/dL — ABNORMAL HIGH (ref 70–99)
Potassium: 4 mmol/L (ref 3.5–5.1)
Sodium: 136 mmol/L (ref 135–145)

## 2021-02-15 LAB — CBC
HCT: 38.2 % (ref 36.0–46.0)
Hemoglobin: 12.6 g/dL (ref 12.0–15.0)
MCH: 31.2 pg (ref 26.0–34.0)
MCHC: 33 g/dL (ref 30.0–36.0)
MCV: 94.6 fL (ref 80.0–100.0)
Platelets: 228 10*3/uL (ref 150–400)
RBC: 4.04 MIL/uL (ref 3.87–5.11)
RDW: 13.5 % (ref 11.5–15.5)
WBC: 6.6 10*3/uL (ref 4.0–10.5)
nRBC: 0 % (ref 0.0–0.2)

## 2021-02-15 MED ORDER — DAPAGLIFLOZIN PROPANEDIOL 10 MG PO TABS: 10.0000 mg | ORAL_TABLET | Freq: Every day | ORAL | 5 refills | Status: DC

## 2021-02-15 NOTE — Progress Notes (Signed)
[facility] Progress Note   Patient ID: Julie Swanson, female   DOB: 09-24-1927, 87 y.o.   MRN: [id number] PCP: Dr. Lilyan Punt Cardiology: Dr. Shirlee Latch  87 y.o. presents for followup of paroxysmal atrial fibrillation and diastolic CHF.  She had been having runs of rapid tachypalpitations when I saw her initially.  She was quite symptomatic with episodes.  Event monitor showed runs of atrial fibrillation with RVR.  She was started her on Multaq for rhythm control given her significant symptoms and on apixaban.  She did not tolerate diltiazem due to ankle swelling and was switched over to Coreg for rate control.  She did not tolerate Coreg and was placed on propranolol. Echo in 11/14 showed EF 60-65% with mild MR. No exertional chest pain or dyspnea. No lightheadedness.  She went into atrial fibrillation again in 8/16.  She had DCCV in 9/16 back to NSR and was continued on Multaq.  However, she went back into atrial fibrillation shortly after on despite Multaq use.  Multaq was stopped.  Given ongoing fatigue with atrial fibrillation, she was started on amiodarone.  DCCV was done in 12/16, she went back into NSR.  Propranolol was stopped due to bradycardia.  Unfortunately, atrial fibrillation recurred.  She saw Tereso Newcomer and it was decided to try to reload her on amiodarone and attempt DCCV one more time.  Later in 12/16, she was cardioverted again while on amiodarone.  However, at 09/10/15 appt in atrial fibrillation clinic, she was back in atrial fibrillation.  She developed a tremor on amiodarone.  Amiodarone was stopped with plan to rate control/anticoagulate going forward.  She is now on Toprol XL and diltiazem CD.   Echo in 2/19 showed EF 60-65%, mild LVH, moderate TR, PASP 37 mmHg.    She had an abnormal mammogram but has not wanted to have a breast biopsy. She is willing to have serial mammograms.   Had recent clinic f/u w/ Dr. Shirlee Latch on 4/26. Afib was well rate controlled but  she was fluid overloaded on exam and diuretics were increased. Lasix increased to 60 mg bid and KCl to 20 bid. Returned for BMP on 5/12. SCr and K both stable but glucose elevated. Hgb A1c 10. PCP started Januvia.   She returns today for volume assessment. Here w/ her daughter. Not feeling well. Has been feeling sluggish since starting Januvia. This was started by PCP on 5/23.  Legs feel week. More SOB w/ exertion.  Denies CP. On Eliquis but no abnormal bleeding. Reports full compliance w/ Lasix but no significant change in diuresis. Remains fluid overloaded w/ 1-2+ bilateral LEE. ReDs Clip only 28%. BP and HR both well controlled.    Labs (10/14): LDL 90, HDL 41, K 4.1, creatinine 0.9 Labs (4/15): LDL 74, HDL 40, K 4.1, creatinine 3.47 Labs (10/15): LDL 73, HDL 38 Labs (12/15): K 4, creatinine 0.9, HCT 36 Labs (8/16): K 4, creatinine 0.97, HCT 37.8, TSH normal Labs (9/16): K 4.6, creatinine 1.06, LDL 68, HDL 47 Labs (11/16): TSH normal, HCT 34.1, LFTs normal Labs (12/16): creatinine 0.96, HCT 35.9 Labs (5/17): K 4.1, creatinine 0.99, LDL 77, HDL 38 Labs (2/18): K 4, creatinine 1.05 Labs (4/18): BNP 321 Labs (5/18): K 3.9, creatinine 1.06, LDL 62 Labs (9/18): K 3.9, creatinine 1.04 Labs (2/19): hgb 12.9, K 3.9, creatinine 0.95 Labs (4/19): LDL 69, HDL 38 Labs (8/19): hgb 12.6, creatinine 0.98 Labs (9/19): K 3.8, creatinine 1.06, hgb 12.8 Labs (2/20): LDL 68, hgb  12.5 Labs (7/20): K 3.7, creatinine 0.97 Labs (10/20): K 3.9, creatinine 0.92, hgb 12.6 Labs (12/21): LDL 81, HDL 32, K 3.9, creatinine 7.79 Labs (4/22): SCr 1.02, K 4.0  Labs (5/22): SCr 1.17, K 4.0 Hgb A1c 10.0   PMH: 1. LHC (8/10) with minimal nonobstructive disease.  ETT (10/13) with 2'53" exercise, nonspecific ECG changes with no evidence for ischemia.  2. Mitral valve prolapse: Echo (11/14) with EF 60-65%, mild MR.  3. Atrial fibrillation:  Event monitor (10/13) with PACs only.  Event monitor (11/14) showed atrial  fibrillation with RVR. She does not tolerate metoprolol (nightmares).  She had ankle swelling with diltiazem. Possible fatigue/diarrhea with Coreg. Holter (8/16) with persistent atrial fibrillation.  DCCV to NSR in 9/16.  Back in atrial fibrillation by 10/16 despite Multaq. DCCV to NSR x 2 in 12/16 on amiodarone, but atrial fibrillation recurred.  Amiodarone stopped. 4. HTN 5. Hyperlipidemia 6. Hysterectomy. 7. Carotid disease: Carotid dopplers (1/15) with prominent plaque, mild stenosis.  - Carotid dopplers (2/20): mild BICA stenosis.  8. Internal hemorrhoids with episodic rectal bleeding.  9. Macular degeneration.  10. PFTs: Mild obstructive lung disease.  11. PAD: ABIs (2/17) were normal, but there was evidence for "developing fem-pop disease."  ABIs normal in 2/18.  12. Pancreatic cysts on CT.  13. Chronic diastolic CHF - Echo (2/19): EF 60-65%, mild LVH, moderate TR, PASP 37 mmHg.  14. H/o Lyme disease  SH: Widow, lives in [place], nonsmoker.  2 daughters.   FH: Mother with CVA, CAD.  Father with CAD.   ROS: All systems reviewed and negative except as per HPI.   Current Outpatient Medications  Medication Sig Dispense Refill  . acetaminophen (TYLENOL) 500 MG tablet Take 500 mg by mouth 2 (two) times daily as needed for moderate pain.    Marland Kitchen apixaban (ELIQUIS) 5 MG TABS tablet Take 1 tablet (5 mg total) by mouth 2 (two) times daily. 180 tablet 3  . atorvastatin (LIPITOR) 10 MG tablet Take 1 tablet (10 mg total) by mouth daily. 90 tablet 3  . cycloSPORINE (RESTASIS) 0.05 % ophthalmic emulsion Place 1 drop into both eyes daily.     Marland Kitchen diltiazem (CARDIZEM CD) 240 MG 24 hr capsule Take 1 capsule (240 mg total) by mouth daily. 90 capsule 3  . furosemide (LASIX) 40 MG tablet Take 1.5 tablets (60 mg total) by mouth 2 (two) times daily. 270 tablet 3  . Lutein 20 MG CAPS Take 1 capsule by mouth daily.    . Metoprolol Succinate 50 MG CS24 Take 50 mg by mouth 2 (two) times daily.    .  mometasone (ELOCON) 0.1 % cream Apply BID prn to rash 45 g 0  . OVER THE COUNTER MEDICATION Med Name: BENE-FIBER Take two (2) teaspoons by mouth daily with 8 oz water.    . potassium chloride SA (KLOR-CON M20) 20 MEQ tablet Take 1 tablet (20 mEq total) by mouth 2 (two) times daily. 180 tablet 3  . Probiotic Product (PROBIOTIC ADVANCED PO) Take by mouth. Once per day    . sitaGLIPtin (JANUVIA) 50 MG tablet Take 1 tablet (50 mg total) by mouth daily. 30 tablet 3   No current facility-administered medications for this encounter.    BP 110/80   Pulse 83   Wt 69.9 kg (154 lb)   SpO2 98%   BMI 26.43 kg/m  PHYSICAL EXAM: ReDs Clip 28%  General:  Well appearing elderly female. No respiratory difficulty HEENT: normal Neck: supple. JVD 8 cm.  Carotids 2+ bilat; no bruits. No lymphadenopathy or thyromegaly appreciated. Cor: PMI nondisplaced. Irregularly irregular rhythm. No rubs, gallops or murmurs. Lungs: clear Abdomen: soft, nontender, nondistended. No hepatosplenomegaly. No bruits or masses. Good bowel sounds. Extremities: no cyanosis, clubbing, rash, 1+ bilateral LE pitting edema Neuro: alert & oriented x 3, cranial nerves grossly intact. moves all 4 extremities w/o difficulty. Affect pleasant.   Assessment/Plan: 1. Atrial fibrillation:  Chronic, has recurred after DCCV in 12/16 on amiodarone.  She has failed amiodarone and Multaq.  She is not a good sotalol or Tikosyn candidate as QTc has been prolonged.  We have decided on following a rate control and anticoagulation strategy. HR is controlled. - Continue Toprol XL 50 mg bid and diltiazem CD 240 mg daily.    - Continue Eliquis. Denies abnormal bleeding. Check CBC   2. HTN: BP controlled.  3. Hyperlipidemia: Good lipids 12/21.  4. Chronic diastolic CHF:  Chronic NYHA class II-III symptoms.  She is volume overloaded on exam today.  - Add Farxiga 10 mg daily  - Continue Lasix  60 mg bid - She needs to wear compression stockings during the  day.  - Check BMP today and again in 7 days  5. DM - Hgb A1c 10.0 - having SE w/ Januvia. D/w Dr. Shirlee Latch. Will discontinue and start Farxiga 10 mg daily    F/u  In 4 weeks to reassess volume status   Robbie Lis, PA-C  02/15/2021

## 2021-02-15 NOTE — Patient Instructions (Signed)
STOP Januvia  START Farxiga 10mg  (1 tab) daily  Labs today and repeat in 1 week at [facility]. Script provided.  We will only contact you if something comes back abnormal or we need to make some changes. Otherwise no news is good news!  Your physician recommends that you schedule a follow-up appointment in: 4 weeks with the Nurse Practitioner or Physician Assistant  Please call office at [phone] option 2 if you have any questions or concerns.   At the [facility], you and your health needs are our priority. As part of our continuing mission to provide you with exceptional heart care, we have created designated Provider Care Teams. These Care Teams include your primary Cardiologist (physician) and Advanced Practice Providers (APPs- Physician Assistants and Nurse Practitioners) who all work together to provide you with the care you need, when you need it.   You may see any of the following providers on your designated Care Team at your next follow up: [phone] Dr Marland Kitchen . Dr Arvilla Meres . Dr Marca Ancona . Thornell Mule, NP . Tonye Becket, PA . Robbie Lis [place],NP . Shanda Bumps, PharmD   Please be sure to bring in all your medications bottles to every appointment.

## 2021-02-22 ENCOUNTER — Other Ambulatory Visit: Payer: Self-pay | Admitting: Cardiology

## 2021-02-23 LAB — BASIC METABOLIC PANEL
BUN/Creatinine Ratio: 15 (ref 12–28)
BUN: 15 mg/dL (ref 10–36)
CO2: 24 mmol/L (ref 20–29)
Calcium: 9.1 mg/dL (ref 8.7–10.3)
Chloride: 97 mmol/L (ref 96–106)
Creatinine, Ser: 1.03 mg/dL — ABNORMAL HIGH (ref 0.57–1.00)
Glucose: 202 mg/dL — ABNORMAL HIGH (ref 65–99)
Potassium: 4.6 mmol/L (ref 3.5–5.2)
Sodium: 137 mmol/L (ref 134–144)
eGFR: 51 mL/min/{1.73_m2} — ABNORMAL LOW (ref >59)

## 2021-02-23 LAB — SPECIMEN STATUS REPORT

## 2021-03-04 ENCOUNTER — Other Ambulatory Visit (HOSPITAL_COMMUNITY): Payer: Self-pay | Admitting: Cardiology

## 2021-03-07 ENCOUNTER — Telehealth: Payer: Self-pay | Admitting: Family Medicine

## 2021-03-07 NOTE — Telephone Encounter (Signed)
Patient is requesting copy of labs from Dr. Shirlee Latch office from 02/22/21 because sugar is still running high. She wanting appointment as soon as possible here to discuss but the first one you had was in July. I explain that I cant print labs out from another doctor but the provider can. Please advise on appointment for patient.

## 2021-03-07 NOTE — Telephone Encounter (Signed)
Patient states her sugar was 200 yesterday in the middle of the day after eating breakfast- her family checked it and she is concerned . Patient has appt 03/23/21 but would like to be worked in sooner because she is concerned about her sugar

## 2021-03-08 NOTE — Telephone Encounter (Signed)
Pt returned call. Per front, pt will think about appt and call back

## 2021-03-08 NOTE — Telephone Encounter (Signed)
Left message to return call. Pt can be put on Friday schedule

## 2021-03-08 NOTE — Telephone Encounter (Signed)
Sympathetic to her situation Limited appointments available this week Heard sugars are not in a dangerous range but you could schedule her later this week Patient should check her sugars twice daily until she is seen

## 2021-03-16 ENCOUNTER — Encounter: Payer: Self-pay | Admitting: Family Medicine

## 2021-03-16 ENCOUNTER — Ambulatory Visit (INDEPENDENT_AMBULATORY_CARE_PROVIDER_SITE_OTHER): Payer: Medicare Other | Admitting: Family Medicine

## 2021-03-16 ENCOUNTER — Other Ambulatory Visit: Payer: Self-pay

## 2021-03-16 VITALS — BP 122/86 | Temp 96.6°F | Wt 150.8 lb

## 2021-03-16 DIAGNOSIS — E1122 Type 2 diabetes mellitus with diabetic chronic kidney disease: Secondary | ICD-10-CM

## 2021-03-16 DIAGNOSIS — N1831 Chronic kidney disease, stage 3a: Secondary | ICD-10-CM | POA: Diagnosis not present

## 2021-03-16 MED ORDER — BLOOD GLUCOSE METER KIT: PACK | 0 refills | Status: DC

## 2021-03-16 NOTE — Progress Notes (Signed)
   Subjective:    Patient ID: Julie Swanson, female    DOB: [birth year], 87 y.o.   MRN: [id number]  HPI Pt here for follow up and  Dr.McLean ordered labs, Januvia sent in due to kidney funtions. Took for 2 weeks. Had heavy legs and not much mobility. Pt had fluid collection at doctor visit recently. Pt was switched to [place] but provider told pt if you get a UTI calls [place]; daughter did not want to do that due to UTI looks like dementia at times Pt has been 3-4 weeks with no diabatic meds. Pt has cut out sugars also.   Pt had blood work at [facility] office on 02/22/21 full panel CBC.   Had side effects with Januvia therefore we will not continue this  Had good discussion with family and patient Trying to alleviate their fears of [place] Best approach currently would be this medicine Review of Systems     Objective:   Physical Exam  General-in no acute distress Eyes-no discharge Lungs-respiratory rate normal, CTA CV-no murmurs,RRR Extremities skin warm dry  edema Neuro grossly normal Behavior normal, alert       Assessment & Plan:  Pedal edema On diuretic furosemide 40 mg 1-1/2 tablet twice daily If we do start Farxiga I would recommend 5 mg to see how the patient tolerates Side effects were discussed Patient will check blood sugar fingersticks several times over the next several days and then send [place] those readings more than likely initiate medication did not tolerate Januvia  Has underlying kidney function issues therefore SGLT2 would be beneficial.  All of this was discussed with family.  They would prefer to wait on initiating medicine until we see the glucose readings over the next several days if we do initiate the medicine we will check a met 7 in several weeks with a follow-up visit we will also reduce the furosemide down to 40 mg twice daily

## 2021-03-17 ENCOUNTER — Other Ambulatory Visit: Payer: Self-pay

## 2021-03-17 DIAGNOSIS — N1831 Chronic kidney disease, stage 3a: Secondary | ICD-10-CM

## 2021-03-17 DIAGNOSIS — E1122 Type 2 diabetes mellitus with diabetic chronic kidney disease: Secondary | ICD-10-CM

## 2021-03-17 MED ORDER — BLOOD GLUCOSE METER KIT: PACK | 0 refills | Status: DC

## 2021-03-18 ENCOUNTER — Other Ambulatory Visit: Payer: Self-pay

## 2021-03-18 ENCOUNTER — Telehealth: Payer: Self-pay | Admitting: Family Medicine

## 2021-03-18 DIAGNOSIS — N1831 Chronic kidney disease, stage 3a: Secondary | ICD-10-CM

## 2021-03-18 MED ORDER — BLOOD GLUCOSE METER KIT: PACK | 0 refills | Status: AC

## 2021-03-18 NOTE — Telephone Encounter (Signed)
Script printed out and will be faxed after provider signature. Also will need to call [facility] to verify that they received. Pt daughter is aware and verbalized understanding.

## 2021-03-18 NOTE — Telephone Encounter (Signed)
Patient's daughter called requesting the glucose meter order and test strips be sent to [facility] again. She states that [facility] told them that they don't have the order. Can we please call and give a verbal or check with them to make sure they have received the request?  CB# [phone]

## 2021-03-21 ENCOUNTER — Encounter (HOSPITAL_COMMUNITY): Payer: Medicare Other

## 2021-03-23 ENCOUNTER — Ambulatory Visit: Payer: Medicare Other | Admitting: Family Medicine

## 2021-03-29 ENCOUNTER — Telehealth: Payer: Self-pay | Admitting: Family Medicine

## 2021-03-29 NOTE — Telephone Encounter (Signed)
Daughter dropped off blood glucose log for patient. Log in provider office for review. Please advise. Thank you.  (Daughter is aware that provider is out of the office this week)

## 2021-03-30 NOTE — Telephone Encounter (Signed)
Glucose readings were received Morning sugars 157, 178 Midday 206, 158 Dinnertime 189, 167 Evening time 246, 244  We will discuss these readings further in detail when the patient follows up on 27 July

## 2021-04-06 ENCOUNTER — Other Ambulatory Visit: Payer: Self-pay

## 2021-04-06 ENCOUNTER — Ambulatory Visit (INDEPENDENT_AMBULATORY_CARE_PROVIDER_SITE_OTHER): Payer: Medicare Other | Admitting: Family Medicine

## 2021-04-06 ENCOUNTER — Encounter: Payer: Self-pay | Admitting: Family Medicine

## 2021-04-06 VITALS — BP 128/84 | Temp 97.1°F | Wt 148.0 lb

## 2021-04-06 DIAGNOSIS — R739 Hyperglycemia, unspecified: Secondary | ICD-10-CM | POA: Diagnosis not present

## 2021-04-06 DIAGNOSIS — E785 Hyperlipidemia, unspecified: Secondary | ICD-10-CM

## 2021-04-06 DIAGNOSIS — E1169 Type 2 diabetes mellitus with other specified complication: Secondary | ICD-10-CM | POA: Diagnosis not present

## 2021-04-06 DIAGNOSIS — N1831 Chronic kidney disease, stage 3a: Secondary | ICD-10-CM

## 2021-04-06 DIAGNOSIS — E1122 Type 2 diabetes mellitus with diabetic chronic kidney disease: Secondary | ICD-10-CM | POA: Diagnosis not present

## 2021-04-06 NOTE — Progress Notes (Signed)
   Subjective:    Patient ID: Julie Swanson, female    DOB: 03/14/1928, 87 y.o.   MRN: [id number]  HPI Pt here to discuss lab and sugar readings. Pt has cut out sugars. Pt has been walking more.   Can pt have sugar free ice cream?  We did discuss today suitable eating options for the patient  We also discussed goals for glucose readings and for the patient to intermittently check her sugars and send us readings over the next several weeks  She denies excessive thirst blurred vision  Review of Systems     Objective:   Physical Exam  General-in no acute distress Eyes-no discharge Lungs-respiratory rate normal, CTA CV-no murmurs,RRR Extremities skin warm dry mild edema in the lower legs Neuro grossly normal Behavior normal, alert       Assessment & Plan:  Diabetes subpar control Dietary measures discussed Patient will do A1c met 7 at the end of September with follow-up office visit.  Potentially may need to be on medication Did not do well with Farxiga  

## 2021-05-03 ENCOUNTER — Encounter (HOSPITAL_COMMUNITY): Payer: Medicare Other

## 2021-05-03 ENCOUNTER — Other Ambulatory Visit: Payer: Self-pay | Admitting: Family Medicine

## 2021-05-20 ENCOUNTER — Other Ambulatory Visit: Payer: Self-pay

## 2021-05-20 ENCOUNTER — Other Ambulatory Visit: Payer: Self-pay | Admitting: Family Medicine

## 2021-05-20 ENCOUNTER — Ambulatory Visit
Admission: RE | Admit: 2021-05-20 | Discharge: 2021-05-20 | Disposition: A | Discharge status: Home / Self Care | Payer: Medicare Other | Source: Ambulatory Visit | Attending: Family Medicine | Admitting: Family Medicine

## 2021-05-20 DIAGNOSIS — N6489 Other specified disorders of breast: Secondary | ICD-10-CM

## 2021-05-20 DIAGNOSIS — R928 Other abnormal and inconclusive findings on diagnostic imaging of breast: Secondary | ICD-10-CM

## 2021-06-01 LAB — BASIC METABOLIC PANEL
BUN/Creatinine Ratio: 15 (ref 12–28)
BUN: 16 mg/dL (ref 10–36)
CO2: 25 mmol/L (ref 20–29)
Calcium: 9.1 mg/dL (ref 8.7–10.3)
Chloride: 98 mmol/L (ref 96–106)
Creatinine, Ser: 1.05 mg/dL — ABNORMAL HIGH (ref 0.57–1.00)
Glucose: 185 mg/dL — ABNORMAL HIGH (ref 65–99)
Potassium: 4.3 mmol/L (ref 3.5–5.2)
Sodium: 136 mmol/L (ref 134–144)
eGFR: 50 mL/min/{1.73_m2} — ABNORMAL LOW (ref >59)

## 2021-06-01 LAB — LIPID PANEL
Chol/HDL Ratio: 3.7 ratio (ref 0.0–4.4)
Cholesterol, Total: 121 mg/dL (ref 100–199)
HDL: 33 mg/dL — ABNORMAL LOW (ref >39)
LDL Chol Calc (NIH): 68 mg/dL (ref 0–99)
Triglycerides: 108 mg/dL (ref 0–149)
VLDL Cholesterol Cal: 20 mg/dL (ref 5–40)

## 2021-06-01 LAB — HEMOGLOBIN A1C
Est. average glucose Bld gHb Est-mCnc: 192 mg/dL
Hgb A1c MFr Bld: 8.3 % — ABNORMAL HIGH (ref 4.8–5.6)

## 2021-06-08 ENCOUNTER — Other Ambulatory Visit (HOSPITAL_COMMUNITY): Payer: Self-pay | Admitting: Cardiology

## 2021-06-09 ENCOUNTER — Other Ambulatory Visit: Payer: Self-pay

## 2021-06-09 ENCOUNTER — Ambulatory Visit (INDEPENDENT_AMBULATORY_CARE_PROVIDER_SITE_OTHER): Payer: Medicare Other | Admitting: Family Medicine

## 2021-06-09 VITALS — BP 133/82 | HR 68 | Wt 147.4 lb

## 2021-06-09 DIAGNOSIS — N63 Unspecified lump in unspecified breast: Secondary | ICD-10-CM | POA: Diagnosis not present

## 2021-06-09 DIAGNOSIS — E1169 Type 2 diabetes mellitus with other specified complication: Secondary | ICD-10-CM

## 2021-06-09 DIAGNOSIS — E785 Hyperlipidemia, unspecified: Secondary | ICD-10-CM | POA: Diagnosis not present

## 2021-06-09 DIAGNOSIS — E1122 Type 2 diabetes mellitus with diabetic chronic kidney disease: Secondary | ICD-10-CM

## 2021-06-09 DIAGNOSIS — N1831 Chronic kidney disease, stage 3a: Secondary | ICD-10-CM

## 2021-06-09 NOTE — Progress Notes (Signed)
breas  Subjective:    Patient ID: Julie Swanson, female    DOB: 04/08/1928, 87 y.o.   MRN: [id number]  Hyperlipidemia This is a chronic problem. The current episode started more than 1 year ago. Treatments tried: lipitor. There are no compliance problems.    Patient unable to Januvia and really doesn't want new sugar medication She is keeping her sugars under decent control by dietary alone.  Denies any setbacks.  Trying to stay active. Review of Systems     Objective:   Physical Exam General-in no acute distress Eyes-no discharge Lungs-respiratory rate normal, CTA CV-no murmurs,RRR Extremities skin warm dry no edema Neuro grossly normal Behavior normal, alert        Assessment & Plan:  Defers on flu shot for now we will do on next visit  1. Breast mass in female Mammogram shows abnormal area patient does not want to have that biopsied.  She states she will just follow-up every 6 months with mammogram given her age this seems reasonable but I have encouraged her to get a biopsy but she does not want to do so her daughter is well aware of all of this  2. Hyperlipidemia associated with type 2 diabetes mellitus (HCC) Healthy diet recommended keeping sugars under decent control recheck A1c in approximately 3 months it needs to stay in the low eights or better may need to be on medication  3. Type 2 diabetes mellitus with stage 3a chronic kidney disease, without long-term current use of insulin (HCC) See above Discussion held regarding all of this.  Time spent with patient discussing the breast mass as well as a diabetes keeping this under control

## 2021-06-09 NOTE — Addendum Note (Signed)
Addended by: Margaretha Sheffield on: 06/09/2021 01:33 PM   Modules accepted: Orders

## 2021-06-09 NOTE — Progress Notes (Signed)
Blood work ordered in Epic. 

## 2021-06-12 ENCOUNTER — Other Ambulatory Visit: Payer: Self-pay | Admitting: Family Medicine

## 2021-06-12 ENCOUNTER — Other Ambulatory Visit (HOSPITAL_COMMUNITY): Payer: Self-pay | Admitting: Cardiology

## 2021-06-16 ENCOUNTER — Ambulatory Visit: Payer: Medicare Other | Admitting: Family Medicine

## 2021-06-21 ENCOUNTER — Other Ambulatory Visit: Payer: Self-pay

## 2021-06-21 ENCOUNTER — Ambulatory Visit (HOSPITAL_COMMUNITY)
Admission: RE | Admit: 2021-06-21 | Discharge: 2021-06-21 | Disposition: A | Discharge status: Home / Self Care | Payer: Medicare Other | Source: Ambulatory Visit | Attending: Cardiology | Admitting: Cardiology

## 2021-06-21 VITALS — BP 122/80 | HR 76 | Wt 147.0 lb

## 2021-06-21 DIAGNOSIS — Z8249 Family history of ischemic heart disease and other diseases of the circulatory system: Secondary | ICD-10-CM | POA: Diagnosis not present

## 2021-06-21 DIAGNOSIS — I4819 Other persistent atrial fibrillation: Secondary | ICD-10-CM

## 2021-06-21 DIAGNOSIS — I110 Hypertensive heart disease with heart failure: Secondary | ICD-10-CM | POA: Insufficient documentation

## 2021-06-21 DIAGNOSIS — I5032 Chronic diastolic (congestive) heart failure: Secondary | ICD-10-CM

## 2021-06-21 DIAGNOSIS — I872 Venous insufficiency (chronic) (peripheral): Secondary | ICD-10-CM | POA: Diagnosis not present

## 2021-06-21 DIAGNOSIS — Z79899 Other long term (current) drug therapy: Secondary | ICD-10-CM | POA: Insufficient documentation

## 2021-06-21 DIAGNOSIS — Z7901 Long term (current) use of anticoagulants: Secondary | ICD-10-CM | POA: Insufficient documentation

## 2021-06-21 DIAGNOSIS — E785 Hyperlipidemia, unspecified: Secondary | ICD-10-CM | POA: Insufficient documentation

## 2021-06-21 DIAGNOSIS — I482 Chronic atrial fibrillation, unspecified: Secondary | ICD-10-CM | POA: Insufficient documentation

## 2021-06-21 LAB — BASIC METABOLIC PANEL
Anion gap: 9 (ref 5–15)
BUN: 9 mg/dL (ref 8–23)
CO2: 27 mmol/L (ref 22–32)
Calcium: 9 mg/dL (ref 8.9–10.3)
Chloride: 100 mmol/L (ref 98–111)
Creatinine, Ser: 0.98 mg/dL (ref 0.44–1.00)
GFR, Estimated: 54 mL/min — ABNORMAL LOW (ref >60)
Glucose, Bld: 161 mg/dL — ABNORMAL HIGH (ref 70–99)
Potassium: 4.2 mmol/L (ref 3.5–5.1)
Sodium: 136 mmol/L (ref 135–145)

## 2021-06-21 LAB — BRAIN NATRIURETIC PEPTIDE: B Natriuretic Peptide: 380.4 pg/mL — ABNORMAL HIGH (ref 0.0–100.0)

## 2021-06-21 NOTE — Addendum Note (Signed)
Encounter addended by: Laurey Morale, MD on: 06/21/2021 9:28 PM  Actions taken: Level of Service modified

## 2021-06-21 NOTE — Progress Notes (Signed)
Medication Samples have been provided to the patient.  Drug name: Eliquis       Strength: 5mg         Qty: 4  LOT:  Exp.Date: 05/2023  Dosing instructions: 1 tab po bid  The patient has been instructed regarding the correct time, dose, and frequency of taking this medication, including desired effects and most common side effects.   Venetia Prewitt R Nishawn Rotan 9:58 AM 06/21/2021

## 2021-06-21 NOTE — Patient Instructions (Signed)
EKG done today.  Labs done today. We will contact you only if your labs are abnormal.  No medication changes were made. Please continue all current medications as prescribed.  Please wear your compression hose daily, place them on as soon as you get up in the morning and remove before you go to bed at night.  Your physician recommends that you schedule a follow-up appointment in: 6 months. Please contact our office in March 2023 to schedule a April 2023 appointment.   If you have any questions or concerns before your next appointment please send [place] a message through [place] or call our office at [phone].    TO LEAVE A MESSAGE FOR THE NURSE SELECT OPTION 2, PLEASE LEAVE A MESSAGE INCLUDING: YOUR NAME DATE OF BIRTH CALL BACK NUMBER REASON FOR CALL**this is important as we prioritize the call backs  YOU WILL RECEIVE A CALL BACK THE SAME DAY AS LONG AS YOU CALL BEFORE 4:00 PM   Do the following things EVERYDAY: Weigh yourself in the morning before breakfast. Write it down and keep it in a log. Take your medicines as prescribed Eat low salt foods--Limit salt (sodium) to 2000 mg per day.  Stay as active as you can everyday Limit all fluids for the day to less than 2 liters   At the [facility], you and your health needs are our priority. As part of our continuing mission to provide you with exceptional heart care, we have created designated Provider Care Teams. These Care Teams include your primary Cardiologist (physician) and Advanced Practice Providers (APPs- Physician Assistants and Nurse Practitioners) who all work together to provide you with the care you need, when you need it.   You may see any of the following providers on your designated Care Team at your next follow up: Dr Arvilla Meres Dr Carron Curie, NP Robbie Lis, [place] Karle Plumber, PharmD   Please be sure to bring in all your medications bottles to every appointment.

## 2021-06-21 NOTE — Progress Notes (Signed)
Patient ID: Julie Swanson, female   DOB: 28-Jun-1928, 87 y.o.   MRN: [id number] PCP: Dr. Sallee Lange Cardiology: Dr. Aundra Dubin  87 y.o. presents for followup of paroxysmal atrial fibrillation and diastolic CHF.  She had been having runs of rapid tachypalpitations when I saw her initially.  She was quite symptomatic with episodes.  Event monitor showed runs of atrial fibrillation with RVR.  I started her on Multaq for rhythm control given her significant symptoms and on apixaban.  She did not tolerate diltiazem due to ankle swelling and was switched over to Coreg for rate control.  She did not tolerate Coreg and is back on propranolol, which she does ok with.  Echo in 11/14 showed EF 60-65% with mild MR. No exertional chest pain or dyspnea. No lightheadedness.  She went into atrial fibrillation again in 8/16.  She had DCCV in 9/16 back to NSR and was continued on Multaq.  However, she went back into atrial fibrillation shortly after on despite Multaq use.  Multaq was stopped.  Given ongoing fatigue with atrial fibrillation, she was started on amiodarone.  DCCV was done in 12/16, she went back into NSR.  Propranolol was stopped due to bradycardia.  Unfortunately, atrial fibrillation has recurred.  She saw Richardson Dopp and it was decided to try to reload her on amiodarone and attempt DCCV one more time.  Later in 12/16, I cardioverted her again while on amiodarone.  However, at 09/10/15 appt in atrial fibrillation clinic, she was back in atrial fibrillation.  She developed a tremor on amiodarone.  Amiodarone was stopped with plan to rate control/anticoagulate going forward.  She is now on Toprol XL and diltiazem CD.  Echo in 2/19 showed EF 60-65%, mild LVH, moderate TR, PASP 37 mmHg.   She had an abnormal mammogram but has not wanted to have a breast biopsy. She is willing to have serial mammograms.   She remains in atrial fibrillation, HR controlled. Weight is down 7 lbs.  She has been walking more, does a daily  walk for 20 minutes.  No dyspnea unless she walks around a store for an hour or more.  No orthopnea/PND.  No chest pain.  Glucose control has been better with diet and exercise.  She took Jardiance for a while, but felt very lethargic and stopped it.    ECG (personally reviewed): atrial fibrillation, inferolateral TWIs   Labs (10/14): LDL 90, HDL 41, K 4.1, creatinine 0.9 Labs (4/15): LDL 74, HDL 40, K 4.1, creatinine 0.87 Labs (10/15): LDL 73, HDL 38 Labs (12/15): K 4, creatinine 0.9, HCT 36 Labs (8/16): K 4, creatinine 0.97, HCT 37.8, TSH normal Labs (9/16): K 4.6, creatinine 1.06, LDL 68, HDL 47 Labs (11/16): TSH normal, HCT 34.1, LFTs normal Labs (12/16): creatinine 0.96, HCT 35.9 Labs (5/17): K 4.1, creatinine 0.99, LDL 77, HDL 38 Labs (2/18): K 4, creatinine 1.05 Labs (4/18): BNP 321 Labs (5/18): K 3.9, creatinine 1.06, LDL 62 Labs (9/18): K 3.9, creatinine 1.04 Labs (2/19): hgb 12.9, K 3.9, creatinine 0.95 Labs (4/19): LDL 69, HDL 38 Labs (8/19): hgb 12.6, creatinine 0.98 Labs (9/19): K 3.8, creatinine 1.06, hgb 12.8 Labs (2/20): LDL 68, hgb 12.5 Labs (7/20): K 3.7, creatinine 0.97 Labs (10/20): K 3.9, creatinine 0.92, hgb 12.6 Labs (12/21): LDL 81, HDL 32, K 3.9, creatinine 0.96 Labs (9/22): K 4.3, creatinine 1.05, LDL 68  PMH: 1. LHC (8/10) with minimal nonobstructive disease.  ETT (10/13) with 2'53" exercise, nonspecific ECG changes with no evidence  for ischemia.  2. Mitral valve prolapse: Echo (11/14) with EF 60-65%, mild MR.  3. Atrial fibrillation:  Event monitor (10/13) with PACs only.  Event monitor (11/14) showed atrial fibrillation with RVR. She does not tolerate metoprolol (nightmares).  She had ankle swelling with diltiazem. Possible fatigue/diarrhea with Coreg. Holter (8/16) with persistent atrial fibrillation.  DCCV to NSR in 9/16.  Back in atrial fibrillation by 10/16 despite Multaq. DCCV to NSR x 2 in 12/16 on amiodarone, but atrial fibrillation recurred.   Amiodarone stopped. 4. HTN 5. Hyperlipidemia 6. Hysterectomy. 7. Carotid disease: Carotid dopplers (1/15) with prominent plaque, mild stenosis.  - Carotid dopplers (2/20): mild BICA stenosis.  8. Internal hemorrhoids with episodic rectal bleeding.  9. Macular degeneration.  10. PFTs: Mild obstructive lung disease.  11. PAD: ABIs (2/17) were normal, but there was evidence for "developing fem-pop disease."  ABIs normal in 2/18.  12. Pancreatic cysts on CT.  13. Chronic diastolic CHF - Echo (8/36): EF 60-65%, mild LVH, moderate TR, PASP 37 mmHg.  62. H/o Lyme disease  SH: Widow, lives in [place], nonsmoker.  2 daughters.   FH: Mother with CVA, CAD.  Father with CAD.   ROS: All systems reviewed and negative except as per HPI.   Current Outpatient Medications  Medication Sig Dispense Refill   acetaminophen (TYLENOL) 500 MG tablet Take 500 mg by mouth 2 (two) times daily as needed for moderate pain.     atorvastatin (LIPITOR) 10 MG tablet Take 1 tablet (10 mg total) by mouth daily. 90 tablet 3   blood glucose meter kit and supplies Dispense based on patient and insurance preference. Use to check sugars once daily Dx E11.9 1 each 0   cycloSPORINE (RESTASIS) 0.05 % ophthalmic emulsion Place 1 drop into both eyes daily.      diltiazem (CARDIZEM CD) 240 MG 24 hr capsule Take 1 capsule (240 mg total) by mouth daily. 90 capsule 3   ELIQUIS 5 MG TABS tablet TAKE 1 TABLET BY MOUTH TWICE A DAY 180 tablet 3   furosemide (LASIX) 40 MG tablet Take 1.5 tablets (60 mg total) by mouth 2 (two) times daily. 270 tablet 3   glucose blood (ONETOUCH ULTRA) test strip USE TO TEST BLOOD SUGAR ONCE A DAY 100 strip 2   Lancets (ONETOUCH DELICA PLUS [id number]) MISC USE TO TEST BLOOD SUGAR ONCE A DAY 100 each 1   Lutein 20 MG CAPS Take 1 capsule by mouth daily.     Metoprolol Succinate 50 MG CS24 Take 50 mg by mouth 2 (two) times daily.     mometasone (ELOCON) 0.1 % cream Apply BID prn to rash 45 g 0   OVER THE  COUNTER MEDICATION Med Name: BENE-FIBER Take two (2) teaspoons by mouth daily with 8 oz water.     potassium chloride SA (KLOR-CON M20) 20 MEQ tablet Take 1 tablet (20 mEq total) by mouth 2 (two) times daily. 180 tablet 3   Probiotic Product (PROBIOTIC ADVANCED PO) Take by mouth. Once per day     No current facility-administered medications for this encounter.    BP 122/80   Pulse 76   Wt 66.7 kg (147 lb)   SpO2 99%   BMI 25.23 kg/m  General: NAD Neck: No JVD, no thyromegaly or thyroid nodule.  Lungs: Clear to auscultation bilaterally with normal respiratory effort. CV: Nondisplaced PMI.  Heart irregular S1/S2, no S3/S4, no murmur.  1+ edema 1/2 up lower legs.  No carotid bruit.  Normal pedal  pulses.  Abdomen: Soft, nontender, no hepatosplenomegaly, no distention.  Skin: Intact without lesions or rashes.  Neurologic: Alert and oriented x 3.  Psych: Normal affect. Extremities: No clubbing or cyanosis. Lower leg venous varicosities.  HEENT: Normal.   Assessment/Plan: 1. Atrial fibrillation:  Chronic, has recurred after DCCV in 12/16 on amiodarone.  She has failed amiodarone and Multaq.  She is not a good sotalol or Tikosyn candidate as QTc has been prolonged.  We have decided on following a rate control and anticoagulation strategy. HR is controlled. - Continue Toprol XL 50 mg bid and diltiazem CD 240 mg daily.    - Continue Eliquis.  2. HTN: BP controlled.  3. Hyperlipidemia: Good lipids 12/21.  4. Chronic diastolic CHF:  Chronic NYHA class II symptoms.  She is not volume overloaded on exam today. She has some peripheral edema, but think this is due to venous insufficiency.  - Continue Lasix 60 mg bid, BMET/BNP today.     - She needs to wear compression stockings during the day.  - She did not tolerate Jardiance.   Followup in 6 months.   Loralie Champagne 06/21/2021

## 2021-06-23 ENCOUNTER — Other Ambulatory Visit: Payer: Medicare Other

## 2021-06-29 ENCOUNTER — Other Ambulatory Visit: Payer: Medicare Other

## 2021-07-07 ENCOUNTER — Other Ambulatory Visit (INDEPENDENT_AMBULATORY_CARE_PROVIDER_SITE_OTHER): Payer: Medicare Other | Admitting: *Deleted

## 2021-07-07 ENCOUNTER — Other Ambulatory Visit: Payer: Self-pay

## 2021-07-07 DIAGNOSIS — Z23 Encounter for immunization: Secondary | ICD-10-CM | POA: Diagnosis not present

## 2021-07-24 ENCOUNTER — Other Ambulatory Visit (HOSPITAL_COMMUNITY): Payer: Self-pay | Admitting: Cardiology

## 2021-10-06 ENCOUNTER — Ambulatory Visit: Payer: Medicare Other | Admitting: Family Medicine

## 2021-10-14 DIAGNOSIS — N1831 Chronic kidney disease, stage 3a: Secondary | ICD-10-CM | POA: Diagnosis not present

## 2021-10-14 DIAGNOSIS — E1122 Type 2 diabetes mellitus with diabetic chronic kidney disease: Secondary | ICD-10-CM | POA: Diagnosis not present

## 2021-10-15 LAB — BASIC METABOLIC PANEL
BUN/Creatinine Ratio: 15 (ref 12–28)
BUN: 15 mg/dL (ref 10–36)
CO2: 26 mmol/L (ref 20–29)
Calcium: 9.3 mg/dL (ref 8.7–10.3)
Chloride: 98 mmol/L (ref 96–106)
Creatinine, Ser: 0.97 mg/dL (ref 0.57–1.00)
Glucose: 177 mg/dL — ABNORMAL HIGH (ref 70–99)
Potassium: 4.1 mmol/L (ref 3.5–5.2)
Sodium: 139 mmol/L (ref 134–144)
eGFR: 55 mL/min/{1.73_m2} — ABNORMAL LOW (ref >59)

## 2021-10-15 LAB — HEMOGLOBIN A1C
Est. average glucose Bld gHb Est-mCnc: 192 mg/dL
Hgb A1c MFr Bld: 8.3 % — ABNORMAL HIGH (ref 4.8–5.6)

## 2021-10-20 ENCOUNTER — Ambulatory Visit: Payer: 59 | Admitting: Family Medicine

## 2021-10-27 ENCOUNTER — Encounter (INDEPENDENT_AMBULATORY_CARE_PROVIDER_SITE_OTHER): Payer: Self-pay

## 2021-11-01 ENCOUNTER — Other Ambulatory Visit: Payer: Self-pay

## 2021-11-01 ENCOUNTER — Ambulatory Visit (INDEPENDENT_AMBULATORY_CARE_PROVIDER_SITE_OTHER): Payer: PPO | Admitting: Family Medicine

## 2021-11-01 ENCOUNTER — Encounter: Payer: Self-pay | Admitting: Family Medicine

## 2021-11-01 VITALS — BP 134/76 | Temp 96.4°F | Wt 148.0 lb

## 2021-11-01 DIAGNOSIS — R739 Hyperglycemia, unspecified: Secondary | ICD-10-CM

## 2021-11-01 DIAGNOSIS — E1169 Type 2 diabetes mellitus with other specified complication: Secondary | ICD-10-CM

## 2021-11-01 DIAGNOSIS — I4819 Other persistent atrial fibrillation: Secondary | ICD-10-CM

## 2021-11-01 DIAGNOSIS — E785 Hyperlipidemia, unspecified: Secondary | ICD-10-CM

## 2021-11-01 DIAGNOSIS — E1122 Type 2 diabetes mellitus with diabetic chronic kidney disease: Secondary | ICD-10-CM | POA: Insufficient documentation

## 2021-11-01 DIAGNOSIS — E1165 Type 2 diabetes mellitus with hyperglycemia: Secondary | ICD-10-CM | POA: Insufficient documentation

## 2021-11-01 DIAGNOSIS — I5032 Chronic diastolic (congestive) heart failure: Secondary | ICD-10-CM

## 2021-11-01 NOTE — Progress Notes (Signed)
° °  Subjective:    Patient ID: Julie Swanson, female    DOB: March 08, 1928, 88 y.o.   MRN: [id number]  Diabetes She presents for her follow-up diabetic visit. She has type 2 diabetes mellitus. (Vision is going per patient. Pt has had recent eye exam earlier this week ) When asked about current treatments, none were reported. (Checking sugars about qow) She does not see a podiatrist.Eye exam is current.   Patient has history of macular degeneration also has history of atrial fib and heart failure managed by cardiology was prescribed Jardiance but did not take it because of concern about side effects  Review of Systems     Objective:   Physical Exam  Lungs clear heart irregular pulse present  does have pedal edema      Assessment & Plan:  1. Hyperlipidemia associated with type 2 diabetes mellitus (HCC) Continue cholesterol medicine.  Watch diet closely.  We did discuss A1c.  Does have diabetes. - Lipid panel  2. Hyperglycemia Does have type 2 diabetes, recommend A1c in 3 months, patient does not want to be on additional medication currently, if progressive troubles or worse will follow-up, if progressive A1c issues or progressive heart failure problems Jardiance would be a good choice - Hemoglobin A1c - Basic metabolic panel  3. Chronic diastolic heart failure (HCC) Stable currently continue current measures May need to add Jardiance patient hesitant but we will discuss more on follow-up in 3 months  4. Persistent atrial fibrillation (HCC) Stable currently continue current measures

## 2021-11-01 NOTE — Patient Instructions (Signed)

## 2021-11-03 ENCOUNTER — Encounter (INDEPENDENT_AMBULATORY_CARE_PROVIDER_SITE_OTHER): Payer: Self-pay | Admitting: Ophthalmology

## 2021-11-03 ENCOUNTER — Ambulatory Visit (INDEPENDENT_AMBULATORY_CARE_PROVIDER_SITE_OTHER): Payer: PPO | Admitting: Ophthalmology

## 2021-11-03 ENCOUNTER — Other Ambulatory Visit: Payer: Self-pay

## 2021-11-03 DIAGNOSIS — H43812 Vitreous degeneration, left eye: Secondary | ICD-10-CM | POA: Diagnosis not present

## 2021-11-03 DIAGNOSIS — H353124 Nonexudative age-related macular degeneration, left eye, advanced atrophic with subfoveal involvement: Secondary | ICD-10-CM | POA: Diagnosis not present

## 2021-11-03 DIAGNOSIS — H353114 Nonexudative age-related macular degeneration, right eye, advanced atrophic with subfoveal involvement: Secondary | ICD-10-CM | POA: Insufficient documentation

## 2021-11-03 NOTE — Assessment & Plan Note (Signed)
Geographic atrophy macular involved.

## 2021-11-03 NOTE — Progress Notes (Signed)
11/03/2021     CHIEF COMPLAINT Patient presents for  Chief Complaint  Patient presents with   Retina Evaluation      HISTORY OF PRESENT ILLNESS: Julie Swanson is a 88 y.o. female who presents to the clinic today for:   HPI     Retina Evaluation           Laterality: left eye   Associated Symptoms: Flashes and Photophobia.  Negative for Floaters         Comments   NP- ARMD evaluation, decreased VA OS, referred by Dr. Sharen Counter, OD. Pt has allergy to tropicamide. Pt states vision is decreasing in the left eye since 4 months ago and has been progressively worsening the last couple months. Pt states she sees sparkles in her vision "most all the time," per patient.  Patient not seen here for 4 years previous examinations commence 2009 on previous EMR and last visit in 2019.  Patient has stated allergy at that time of dry eyes still with nausea vomiting and lightheaded.  The first examination dated 2009 states however that mydriasis was associated with vomiting and angle-closure, prior to that 2009 examination.  No further documentation.  This does suggest with the angle-closure that prior to becoming pseudophakic that she developed angle-closure glaucoma features.  It is likely therefore that the allergy attributed to Mydriacyl may in fact been a side effect nonetheless we will avoid tropicamide.  I discussed with the family that typically is phenylephrine that can have systemic vascular side effects such as triggering "stroke".  But that we will still not dilate the eye today.      Last edited by Hurman Horn, MD on 11/03/2021  9:56 AM.      Referring physician: Amedeo Kinsman, OD [address]  [place] [address]  HISTORICAL INFORMATION:   Selected notes from the MEDICAL RECORD NUMBER    Lab Results  Component Value Date   HGBA1C 8.3 (H) 10/14/2021     CURRENT MEDICATIONS: Current Outpatient Medications (Ophthalmic Drugs)  Medication Sig    cycloSPORINE (RESTASIS) 0.05 % ophthalmic emulsion Place 1 drop into both eyes daily.    No current facility-administered medications for this visit. (Ophthalmic Drugs)   Current Outpatient Medications (Other)  Medication Sig   acetaminophen (TYLENOL) 500 MG tablet Take 500 mg by mouth 2 (two) times daily as needed for moderate pain.   atorvastatin (LIPITOR) 10 MG tablet TAKE 1 TABLET BY MOUTH EVERY DAY   blood glucose meter kit and supplies Dispense based on patient and insurance preference. Use to check sugars once daily Dx E11.9   diltiazem (CARDIZEM CD) 240 MG 24 hr capsule TAKE 1 CAPSULE BY MOUTH EVERY DAY   ELIQUIS 5 MG TABS tablet TAKE 1 TABLET BY MOUTH TWICE A DAY   furosemide (LASIX) 40 MG tablet Take 1.5 tablets (60 mg total) by mouth 2 (two) times daily.   glucose blood (ONETOUCH ULTRA) test strip USE TO TEST BLOOD SUGAR ONCE A DAY   KLOR-CON M20 20 MEQ tablet TAKE 1 TABLET BY MOUTH EVERY DAY   Lancets (ONETOUCH DELICA PLUS [id number]) MISC USE TO TEST BLOOD SUGAR ONCE A DAY   Lutein 20 MG CAPS Take 1 capsule by mouth daily.   metoprolol succinate (TOPROL-XL) 50 MG 24 hr tablet TAKE 1 TABLET BY MOUTH TWICE A DAY FOLLOWING A MEAL   Metoprolol Succinate 50 MG CS24 Take 50 mg by mouth 2 (two) times daily.   mometasone (  ELOCON) 0.1 % cream Apply BID prn to rash   OVER THE COUNTER MEDICATION Med Name: BENE-FIBER Take two (2) teaspoons by mouth daily with 8 oz water.   Probiotic Product (PROBIOTIC ADVANCED PO) Take by mouth. Once per day   No current facility-administered medications for this visit. (Other)      REVIEW OF SYSTEMS: ROS   Negative for: Constitutional, Gastrointestinal, Neurological, Skin, Genitourinary, Musculoskeletal, HENT, Endocrine, Cardiovascular, Eyes, Respiratory, Psychiatric, Allergic/Imm, Heme/Lymph Last edited by Hurman Horn, MD on 11/03/2021  9:45 AM.       ALLERGIES Allergies  Allergen Reactions   Valtrex [Valacyclovir Hcl] Palpitations     Chest pain.   Antihistamines, Chlorpheniramine-Type Other (See Comments)    Patient states it makes her feel like she's in "[place]"   Evista [Raloxifene] Other (See Comments)    Causes knots   Feldene [Piroxicam] Other (See Comments)    unknown   Januvia [Sitagliptin]    Other     Narcotics Stearg medication/sedation Phenoeperum for eye   Toprol Xl [Metoprolol Tartrate]     Bad dreams   Tropicamide     Dizziness, lethargic, non-responsive    PAST MEDICAL HISTORY Past Medical History:  Diagnosis Date   Arthritis    Atrial fibrillation ([place])    CAD (coronary artery disease)    Chest pain    CHF (congestive heart failure) ([place])    Complication of anesthesia    pt states "hard to wake up"   HLD (hyperlipidemia)    HTN (hypertension)    Hyperkalemia    Macular degeneration    MVP (mitral valve prolapse)    Osteoporosis    Palpitations    Past Surgical History:  Procedure Laterality Date   ABDOMINAL HYSTERECTOMY     BREAST EXCISIONAL BIOPSY Left    BREAST EXCISIONAL BIOPSY Right    CARDIOVERSION N/A 05/13/2015   Procedure: CARDIOVERSION;  Surgeon: Larey Dresser, MD;  Location: [place];  Service: Cardiovascular;  Laterality: N/A;   CARDIOVERSION N/A 08/13/2015   Procedure: CARDIOVERSION;  Surgeon: Larey Dresser, MD;  Location: [place];  Service: Cardiovascular;  Laterality: N/A;   CARDIOVERSION N/A 08/31/2015   Procedure: CARDIOVERSION;  Surgeon: Larey Dresser, MD;  Location: [place];  Service: Cardiovascular;  Laterality: N/A;   CATARACT EXTRACTION     BOTH EYES   COLONOSCOPY     COLONOSCOPY N/A 08/14/2014   Procedure: COLONOSCOPY;  Surgeon: Rogene Houston, MD;  [facility];  Service: Endoscopy;  Laterality: N/A;  225   CYSTECTOMY     BREAST   hysterectomy - unknown type     TOOTH EXTRACTION Left 12/30/2020    FAMILY HISTORY Family History  Problem Relation Age of Onset   Heart disease Mother    Stroke Mother        ane  melanoma   Heart disease Father    Bladder Cancer Father    Cancer Brother    CAD Brother        ALL   Stroke Other    Heart attack Other    CAD Sister        X2    SOCIAL HISTORY Social History   Tobacco Use   Smoking status: Never   Smokeless tobacco: Never  Substance Use Topics   Alcohol use: No   Drug use: No         OPHTHALMIC EXAM:  Base Eye Exam     Visual Acuity (ETDRS)  Right Left   Dist cc CF at 2' 20/60 -2   Dist ph cc  NI    Correction: Glasses         Tonometry (Tonopen, 9:25 AM)       Right Left   Pressure 16 16         Pupils       Pupils Dark Light APD   Right PERRL 5 4 None   Left PERRL 5 4 None         Visual Fields (Counting fingers)       Left Right    Full    Restrictions  Total inferior temporal deficiency; Partial outer superior temporal, superior nasal, inferior nasal deficiencies         Extraocular Movement       Right Left    Full Full         Neuro/Psych     Oriented x3: Yes   Mood/Affect: Normal         Dilation     Both eyes: No dilation per Dr. Zadie Rhine @ 9:25 AM           Slit Lamp and Fundus Exam     Slit Lamp Exam       Right Left   Lens Centered posterior chamber intraocular lens Centered posterior chamber intraocular lens            IMAGING AND PROCEDURES  Imaging and Procedures for 11/03/21  OCT, Retina - OU - Both Eyes       Right Eye Quality was good. Scan locations included subfoveal. Central Foveal Thickness: 277. Progression has worsened. Findings include abnormal foveal contour, subretinal hyper-reflective material.   Left Eye Quality was good. Scan locations included subfoveal. Central Foveal Thickness: 230. Progression has worsened. Findings include abnormal foveal contour, subretinal hyper-reflective material.   Notes Subretinal atrophy OD has progressed over the ensuing 4 years since last visit OD and OS, OS now with further atrophy developing  accounting for acuity decline in the remaining good acuity I  Photos reviewed with the patient and family so as to disclose why there are features of tunnel vision in the left eye with side vision dramatically affected by the dry atrophic geographic atrophy of AMD     Color Fundus Photography Optos - OU - Both Eyes       Right Eye Progression has worsened. Disc findings include normal observations. Macula : geographic atrophy. Vessels : normal observations. Periphery : normal observations.   Left Eye Progression has worsened. Disc findings include normal observations. Macula : geographic atrophy. Vessels : normal observations. Periphery : normal observations.   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  Advanced nonexudative age-related macular degeneration of left eye with subfoveal involvement Geographic atrophy macular beginning to be involved accounting for [facility] in visual acuity no sign of CNVM      ICD-10-CM   1. Advanced nonexudative age-related macular degeneration of left eye with subfoveal involvement  H35.3124 OCT, Retina - OU - Both Eyes    Color Fundus Photography Optos - OU - Both Eyes    2. Advanced nonexudative age-related macular degeneration of right eye with subfoveal involvement  H35.3114 OCT, Retina - OU - Both Eyes    Color Fundus Photography Optos - OU - Both Eyes  1.  Examination was performed with combination of color fundus photography, OCT and undilated 90 diopter examination through the pupil.  2.  At the close of the examination, patient reports peripheral vision abnormality in the left eye.  We will thus perform B-scan ultrasonography so as we can still not subject the patient to dilation with any topical medications.  3.  Because of the lack of ability to  dilate the eyes safely, we performed B-scan ultrasonography to confirm the a periphery is normal.  There are no retinal holes or tears by B-scan ultrasonography.  I explained to the patient that there are 2 companies called [employer] and the other is [employer] with medications injectable that might slow progression of geographic atrophy that might receive FDA approval this year.  Ophthalmic Meds Ordered this visit:  No orders of the defined types were placed in this encounter.      No follow-ups on file.  There are no Patient Instructions on file for this visit.   Explained the diagnoses, plan, and follow up with the patient and they expressed understanding.  Patient expressed understanding of the importance of proper follow up care.   Clent Demark Teauna Dubach M.D. Diseases & Surgery of the Retina and Vitreous Retina & Diabetic [place] 11/03/21     Abbreviations: M myopia (nearsighted); A astigmatism; H hyperopia (farsighted); P presbyopia; Mrx spectacle prescription;  CTL contact lenses; OD right eye; OS left eye; OU both eyes  XT exotropia; ET esotropia; PEK punctate epithelial keratitis; PEE punctate epithelial erosions; DES dry eye syndrome; MGD meibomian gland dysfunction; ATs artificial tears; PFAT's preservative free artificial tears; [place] nuclear sclerotic cataract; PSC posterior subcapsular cataract; ERM epi-retinal membrane; PVD posterior vitreous detachment; RD retinal detachment; DM diabetes mellitus; DR diabetic retinopathy; NPDR non-proliferative diabetic retinopathy; PDR proliferative diabetic retinopathy; CSME clinically significant macular edema; DME diabetic macular edema; dbh dot blot hemorrhages; CWS cotton wool spot; POAG primary open angle glaucoma; C/D cup-to-disc ratio; HVF humphrey visual field; GVF goldmann visual field; OCT optical coherence tomography; IOP intraocular pressure; BRVO Branch retinal vein occlusion; CRVO central retinal vein occlusion; CRAO central retinal  artery occlusion; BRAO branch retinal artery occlusion; RT retinal tear; SB scleral buckle; PPV pars plana vitrectomy; VH Vitreous hemorrhage; PRP panretinal laser photocoagulation; IVK intravitreal kenalog; VMT vitreomacular traction; MH Macular hole;  NVD neovascularization of the disc; NVE neovascularization elsewhere; AREDS age related eye disease study; ARMD age related macular degeneration; POAG primary open angle glaucoma; EBMD epithelial/anterior basement membrane dystrophy; ACIOL anterior chamber intraocular lens; IOL intraocular lens; PCIOL posterior chamber intraocular lens; Phaco/IOL phacoemulsification with intraocular lens placement; [place] photorefractive keratectomy; LASIK laser assisted in situ keratomileusis; HTN hypertension; DM diabetes mellitus; COPD chronic obstructive pulmonary disease

## 2021-11-03 NOTE — Assessment & Plan Note (Signed)
Geographic atrophy macular beginning to be involved accounting for [facility] in visual acuity no sign of CNVM

## 2021-11-17 ENCOUNTER — Telehealth: Payer: Self-pay | Admitting: Family Medicine

## 2021-11-17 NOTE — Telephone Encounter (Signed)
?  Left message for patient to call back and schedule Medicare Annual Wellness Visit (AWV) in office.  ? ?If unable to come into the office for AWV,  please offer to do virtually or by telephone. ? ?No hx of AWV eligible for AWVI per [facility] as of 07/12/2020  ? ?Please schedule at anytime with [facility] Health Advisor.     ? ?40 Minutes appointment  ? ?Any questions, please call me at [phone]   ?

## 2021-11-18 ENCOUNTER — Other Ambulatory Visit: Payer: Medicare Other

## 2021-11-22 ENCOUNTER — Ambulatory Visit: Payer: PPO

## 2021-12-05 ENCOUNTER — Other Ambulatory Visit (HOSPITAL_COMMUNITY): Payer: Self-pay | Admitting: Cardiology

## 2021-12-06 ENCOUNTER — Other Ambulatory Visit: Payer: Self-pay

## 2021-12-06 ENCOUNTER — Ambulatory Visit (INDEPENDENT_AMBULATORY_CARE_PROVIDER_SITE_OTHER): Payer: PPO

## 2021-12-06 ENCOUNTER — Telehealth: Payer: Self-pay

## 2021-12-06 VITALS — Ht 64.0 in | Wt 148.0 lb

## 2021-12-06 DIAGNOSIS — Z0000 Encounter for general adult medical examination without abnormal findings: Secondary | ICD-10-CM | POA: Diagnosis not present

## 2021-12-06 NOTE — Telephone Encounter (Signed)
Spoke with pt and pt's daughter, Julie Swanson for pt's AWV visit today. 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 Dr. Luciana Axe recommended having OT come into the home to help patient learn how to navigate her home with her losing sight. Can a referral be sent for her? Thank you! ?

## 2021-12-06 NOTE — Telephone Encounter (Signed)
Please go ahead with referral due to legal blindness, OT referral ?

## 2021-12-06 NOTE — Progress Notes (Signed)
? ?Subjective:  ? Julie Swanson is a 88 y.o. female who presents for an Initial Medicare Annual Wellness Visit. ?Virtual Visit via Telephone Note ? ?I connected with  Julie Swanson on 12/06/21 at  1:00 PM EDT by telephone and verified that I am speaking with the correct person using two identifiers. ? ?Location: ?Patient: HOME ?Provider: [facility] ?Persons participating in the virtual visit: patient/Nurse Health Advisor ?  ?I discussed the limitations, risks, security and privacy concerns of performing an evaluation and management service by telephone and the availability of in person appointments. The patient expressed understanding and agreed to proceed. ? ?Interactive audio and video telecommunications were attempted between this nurse and patient, however failed, due to patient having technical difficulties OR patient did not have access to video capability.  We continued and completed visit with audio only. ? ?Some vital signs may be absent or patient reported.  ? ?Chriss Driver, LPN ? ?Review of Systems    ? ?Cardiac Risk Factors include: advanced age (>84mn, >>34women);dyslipidemia;hypertension;sedentary lifestyle;Other (see comment), Risk factor comments: osteoporosis ? ?   ?Objective:  ?  ?Today's Vitals  ? 12/06/21 1302  ?Weight: 148 lb (67.1 kg)  ?Height: '5\' 4"'  (1.626 m)  ? ?Body mass index is 25.4 kg/m?. ? ? ?  12/06/2021  ?  1:12 PM 08/31/2015  ? 12:04 PM 08/13/2015  ? 10:51 AM 05/13/2015  ? 10:15 AM  ?Advanced Directives  ?Does Patient Have a Medical Advance Directive? Yes Yes Yes Yes  ?Type of [place]amed[place]of [place] will [place] will [place] will  ?Copy of [place] Chart? No - copy requested No - copy requested No - copy requested No - copy requested  ?Would patient like information on creating a medical advance directive? No - Patient declined     ? ? ?Current  Medications (verified) ?Outpatient Encounter Medications as of 12/06/2021  ?Medication Sig  ? acetaminophen (TYLENOL) 500 MG tablet Take 500 mg by mouth 2 (two) times daily as needed for moderate pain.  ? atorvastatin (LIPITOR) 10 MG tablet TAKE 1 TABLET BY MOUTH EVERY DAY  ? blood glucose meter kit and supplies Dispense based on patient and insurance preference. Use to check sugars once daily Dx E11.9  ? cycloSPORINE (RESTASIS) 0.05 % ophthalmic emulsion Place 1 drop into both eyes daily.   ? diltiazem (CARDIZEM CD) 240 MG 24 hr capsule TAKE 1 CAPSULE BY MOUTH EVERY DAY  ? ELIQUIS 5 MG TABS tablet TAKE (1) TABLET BY MOUTH TWICE DAILY.  ? furosemide (LASIX) 40 MG tablet Take 1.5 tablets (60 mg total) by mouth 2 (two) times daily.  ? glucose blood (ONETOUCH ULTRA) test strip USE TO TEST BLOOD SUGAR ONCE A DAY  ? KLOR-CON M20 20 MEQ tablet TAKE 1 TABLET BY MOUTH EVERY DAY  ? Lancets (ONETOUCH DELICA PLUS [id number] MISC USE TO TEST BLOOD SUGAR ONCE A DAY  ? Lutein 20 MG CAPS Take 1 capsule by mouth daily.  ? metoprolol succinate (TOPROL-XL) 50 MG 24 hr tablet TAKE 1 TABLET BY MOUTH TWICE A DAY FOLLOWING A MEAL  ? Metoprolol Succinate 50 MG CS24 Take 50 mg by mouth 2 (two) times daily.  ? mometasone (ELOCON) 0.1 % cream Apply BID prn to rash  ? neomycin-polymyxin b-dexamethasone (MAXITROL) 3.5-10000-0.1 OINT SMARTSIG:sparingly In Eye(s) 3 Times Daily  ? OVER THE COUNTER MEDICATION Med Name: BENE-FIBER ?Take two (2) teaspoons  by mouth daily with 8 oz water.  ? Probiotic Product (PROBIOTIC ADVANCED PO) Take by mouth. Once per day  ? ?No facility-administered encounter medications on file as of 12/06/2021.  ? ? ?Allergies (verified) ?Valtrex [valacyclovir hcl]; Antihistamines, chlorpheniramine-type; Evista [raloxifene]; Feldene [piroxicam]; Januvia [sitagliptin]; Other; Toprol xl [metoprolol tartrate]; and Tropicamide  ? ?History: ?Past Medical History:  ?Diagnosis Date  ? Arthritis   ? Atrial fibrillation ([place])   ? CAD  (coronary artery disease)   ? Chest pain   ? CHF (congestive heart failure) ([place])   ? Complication of anesthesia   ? pt states "hard to wake up"  ? HLD (hyperlipidemia)   ? HTN (hypertension)   ? Hyperkalemia   ? Macular degeneration   ? MVP (mitral valve prolapse)   ? Osteoporosis   ? Palpitations   ? ?Past Surgical History:  ?Procedure Laterality Date  ? ABDOMINAL HYSTERECTOMY    ? BREAST EXCISIONAL BIOPSY Left   ? BREAST EXCISIONAL BIOPSY Right   ? CARDIOVERSION N/A 05/13/2015  ? Procedure: CARDIOVERSION;  Surgeon: Larey Dresser, MD;  Location: [facility];  Service: Cardiovascular;  Laterality: N/A;  ? CARDIOVERSION N/A 08/13/2015  ? Procedure: CARDIOVERSION;  Surgeon: Larey Dresser, MD;  Location: [place];  Service: Cardiovascular;  Laterality: N/A;  ? CARDIOVERSION N/A 08/31/2015  ? Procedure: CARDIOVERSION;  Surgeon: Larey Dresser, MD;  Location: [place];  Service: Cardiovascular;  Laterality: N/A;  ? CATARACT EXTRACTION    ? BOTH EYES  ? COLONOSCOPY    ? COLONOSCOPY N/A 08/14/2014  ? Procedure: COLONOSCOPY;  Surgeon: Rogene [place], MD;  [facility];  Service: Endoscopy;  Laterality: N/A;  225  ? CYSTECTOMY    ? BREAST  ? hysterectomy - unknown type    ? TOOTH EXTRACTION Left 12/30/2020  ? ?Family History  ?Problem Relation Age of Onset  ? Heart disease Mother   ? Stroke Mother   ?     ane melanoma  ? Heart disease Father   ? Bladder Cancer Father   ? Cancer Brother   ? CAD Brother   ?     ALL  ? Stroke Other   ? Heart attack Other   ? CAD Sister   ?     X2  ? ?Social History  ? ?Socioeconomic History  ? Marital status: Widowed  ?  Spouse name: Not on file  ? Number of children: 2  ? Years of education: Not on file  ? Highest education level: Not on file  ?Occupational History  ? Not on file  ?Tobacco Use  ? Smoking status: Never  ? Smokeless tobacco: Never  ?Substance and Sexual Activity  ? Alcohol use: No  ? Drug use: No  ? Sexual activity: Not on file  ?Other Topics Concern  ?  Not on file  ?Social History Narrative  ? 2 daughter, Blanch Media and Bethena Roys.  ? 1 grandson  ? 1 step grandson  ? ?Social Determinants of Health  ? ?Financial Resource Strain: Low Risk   ? Difficulty of Paying Living Expenses: Not hard at all  ?Food Insecurity: No Food Insecurity  ? Worried About Charity fundraiser in the Last Year: Never true  ? Ran Out of Food in the Last Year: Never true  ?Transportation Needs: No Transportation Needs  ? Lack of Transportation (Medical): No  ? Lack of Transportation (Non-Medical): No  ?Physical Activity: Insufficiently Active  ? Days of Exercise per Week: 4 days  ?  Minutes of Exercise per Session: 20 min  ?Stress: No Stress Concern Present  ? Feeling of Stress : Not at all  ?Social Connections: Moderately Isolated  ? Frequency of Communication with Friends and Family: More than three times a week  ? Frequency of Social Gatherings with Friends and Family: More than three times a week  ? Attends Religious Services: 1 to 4 times per year  ? Active Member of Clubs or Organizations: No  ? Attends Archivist Meetings: Never  ? Marital Status: Widowed  ? ? ?Tobacco Counseling ?Counseling given: Not Answered ? ? ?Clinical Intake: ? ?Pre-visit preparation completed: Yes ? ?Pain : No/denies pain ? ?  ? ?BMI - recorded: 25.4 ?Nutritional Status: BMI 25 -29 Overweight ?Nutritional Risks: None ?Diabetes: No ? ?How often do you need to have someone help you when you read instructions, pamphlets, or other written materials from your doctor or pharmacy?: 1 - Never ? ?Diabetic?NO ? ?Interpreter Needed?: No ? ?Information entered by :: mj Samar Dass,lpn ? ? ?Activities of Daily Living ? ?  12/06/2021  ?  1:18 PM  ?In your present state of health, do you have any difficulty performing the following activities:  ?Hearing? 0  ?Vision? 1  ?Comment Macular degeneration  ?Difficulty concentrating or making decisions? 1  ?Comment At times per daughter.  ?Walking or climbing stairs? 0  ?Dressing or bathing?  0  ?Doing errands, shopping? 0  ?Preparing Food and eating ? N  ?Using the Toilet? N  ?In the past six months, have you accidently leaked urine? Y  ?Comment at times due to fluid pills  ?Do you have problems

## 2021-12-06 NOTE — Patient Instructions (Signed)
Julie Swanson , ?Thank you for taking time to come for your Medicare Wellness Visit. I appreciate your ongoing commitment to your health goals. Please review the following plan we discussed and let me know if I can assist you in the future.  ? ?Screening recommendations/referrals: ?Colonoscopy: No longer required due to age.  ?Mammogram: Done 05/20/2021 Repeat annually ? ?Bone Density: Done 05/28/2015 Repeat no longer required. ? ?Recommended yearly ophthalmology/optometry visit for glaucoma screening and checkup ?Recommended yearly dental visit for hygiene and checkup ? ?Vaccinations: ?Influenza vaccine: Done 07/07/2021 Repeat annually ? ?Pneumococcal vaccine: Done 06/29/14 and 09/10/1996 ?Tdap vaccine: Due Repeat in 10 years ? ?Shingles vaccine: Discussed.   ?Covid-19:Done 08/19/21, 09/01/20, 12/20/19 and 11/19/2019. ? ?Advanced directives: Advance directive discussed with you today. I have provided a copy for you to complete at home and have notarized. Once this is complete please bring a copy in to our office so we can scan it into your chart. ?Mailed out today. ? ?Conditions/risks identified: KEEP UP THE GOOD WORK!!! ? ?Next appointment: Follow up in one year for your annual wellness visit 2024. ? ? ?Preventive Care 75 Years and Older, Female ?Preventive care refers to lifestyle choices and visits with your health care provider that can promote health and wellness. ?What does preventive care include? ?A yearly physical exam. This is also called an annual well check. ?Dental exams once or twice a year. ?Routine eye exams. Ask your health care provider how often you should have your eyes checked. ?Personal lifestyle choices, including: ?Daily care of your teeth and gums. ?Regular physical activity. ?Eating a healthy diet. ?Avoiding tobacco and drug use. ?Limiting alcohol use. ?Practicing safe sex. ?Taking low-dose aspirin every day. ?Taking vitamin and mineral supplements as recommended by your health care provider. ?What  happens during an annual well check? ?The services and screenings done by your health care provider during your annual well check will depend on your age, overall health, lifestyle risk factors, and family history of disease. ?Counseling  ?Your health care provider may ask you questions about your: ?Alcohol use. ?Tobacco use. ?Drug use. ?Emotional well-being. ?Home and relationship well-being. ?Sexual activity. ?Eating habits. ?History of falls. ?Memory and ability to understand (cognition). ?Work and work Statistician. ?Reproductive health. ?Screening  ?You may have the following tests or measurements: ?Height, weight, and BMI. ?Blood pressure. ?Lipid and cholesterol levels. These may be checked every 5 years, or more frequently if you are over 70 years old. ?Skin check. ?Lung cancer screening. You may have this screening every year starting at age 87 if you have a 30-pack-year history of smoking and currently smoke or have quit within the past 15 years. ?Fecal occult blood test (FOBT) of the stool. You may have this test every year starting at age 36. ?Flexible sigmoidoscopy or colonoscopy. You may have a sigmoidoscopy every 5 years or a colonoscopy every 10 years starting at age 69. ?Hepatitis C blood test. ?Hepatitis B blood test. ?Sexually transmitted disease (STD) testing. ?Diabetes screening. This is done by checking your blood sugar (glucose) after you have not eaten for a while (fasting). You may have this done every 1-3 years. ?Bone density scan. This is done to screen for osteoporosis. You may have this done starting at age 9. ?Mammogram. This may be done every 1-2 years. Talk to your health care provider about how often you should have regular mammograms. ?Talk with your health care provider about your test results, treatment options, and if necessary, the need for more tests. ?Vaccines  ?  Your health care provider may recommend certain vaccines, such as: ?Influenza vaccine. This is recommended every  year. ?Tetanus, diphtheria, and acellular pertussis (Tdap, Td) vaccine. You may need a Td booster every 10 years. ?Zoster vaccine. You may need this after age 60. ?Pneumococcal 13-valent conjugate (PCV13) vaccine. One dose is recommended after age 19. ?Pneumococcal polysaccharide (PPSV23) vaccine. One dose is recommended after age 38. ?Talk to your health care provider about which screenings and vaccines you need and how often you need them. ?This information is not intended to replace advice given to you by your health care provider. Make sure you discuss any questions you have with your health care provider. ?Document Released: 09/24/2015 Document Revised: 05/17/2016 Document Reviewed: 06/29/2015 ?Elsevier Interactive Patient Education ? 2017 [place]. ? ?Fall Prevention in the Home ?Falls can cause injuries. They can happen to people of all ages. There are many things you can do to make your home safe and to help prevent falls. ?What can I do on the outside of my home? ?Regularly fix the edges of walkways and driveways and fix any cracks. ?Remove anything that might make you trip as you walk through a door, such as a raised step or threshold. ?Trim any bushes or trees on the path to your home. ?Use bright outdoor lighting. ?Clear any walking paths of anything that might make someone trip, such as rocks or tools. ?Regularly check to see if handrails are loose or broken. Make sure that both sides of any steps have handrails. ?Any raised decks and porches should have guardrails on the edges. ?Have any leaves, snow, or ice cleared regularly. ?Use sand or salt on walking paths during winter. ?Clean up any spills in your garage right away. This includes oil or grease spills. ?What can I do in the bathroom? ?Use night lights. ?Install grab bars by the toilet and in the tub and shower. Do not use towel bars as grab bars. ?Use non-skid mats or decals in the tub or shower. ?If you need to sit down in the shower, use a  plastic, non-slip stool. ?Keep the floor dry. Clean up any water that spills on the floor as soon as it happens. ?Remove soap buildup in the tub or shower regularly. ?Attach bath mats securely with double-sided non-slip rug tape. ?Do not have throw rugs and other things on the floor that can make you trip. ?What can I do in the bedroom? ?Use night lights. ?Make sure that you have a light by your bed that is easy to reach. ?Do not use any sheets or blankets that are too big for your bed. They should not hang down onto the floor. ?Have a firm chair that has side arms. You can use this for support while you get dressed. ?Do not have throw rugs and other things on the floor that can make you trip. ?What can I do in the kitchen? ?Clean up any spills right away. ?Avoid walking on wet floors. ?Keep items that you use a lot in easy-to-reach places. ?If you need to reach something above you, use a strong step stool that has a grab bar. ?Keep electrical cords out of the way. ?Do not use floor polish or wax that makes floors slippery. If you must use wax, use non-skid floor wax. ?Do not have throw rugs and other things on the floor that can make you trip. ?What can I do with my stairs? ?Do not leave any items on the stairs. ?Make sure that there are  handrails on both sides of the stairs and use them. Fix handrails that are broken or loose. Make sure that handrails are as long as the stairways. ?Check any carpeting to make sure that it is firmly attached to the stairs. Fix any carpet that is loose or worn. ?Avoid having throw rugs at the top or bottom of the stairs. If you do have throw rugs, attach them to the floor with carpet tape. ?Make sure that you have a light switch at the top of the stairs and the bottom of the stairs. If you do not have them, ask someone to add them for you. ?What else can I do to help prevent falls? ?Wear shoes that: ?Do not have high heels. ?Have rubber bottoms. ?Are comfortable and fit you  well. ?Are closed at the toe. Do not wear sandals. ?If you use a stepladder: ?Make sure that it is fully opened. Do not climb a closed stepladder. ?Make sure that both sides of the stepladder are locked into pla

## 2021-12-07 ENCOUNTER — Other Ambulatory Visit: Payer: Self-pay

## 2021-12-07 DIAGNOSIS — H548 Legal blindness, as defined in USA: Secondary | ICD-10-CM

## 2021-12-07 DIAGNOSIS — N1831 Chronic kidney disease, stage 3a: Secondary | ICD-10-CM

## 2021-12-07 DIAGNOSIS — H3530 Unspecified macular degeneration: Secondary | ICD-10-CM

## 2021-12-07 NOTE — Telephone Encounter (Signed)
Left message for patient to return the call for additional details and recommendations.   ?Referral to Home health / OT services placed per drs recommendations ?

## 2021-12-09 NOTE — Telephone Encounter (Signed)
Left message on Darel Hong cell  to return call  ?

## 2021-12-12 ENCOUNTER — Ambulatory Visit
Admission: RE | Admit: 2021-12-12 | Discharge: 2021-12-12 | Disposition: A | Discharge status: Home / Self Care | Payer: PPO | Source: Ambulatory Visit | Attending: Family Medicine | Admitting: Family Medicine

## 2021-12-12 ENCOUNTER — Other Ambulatory Visit: Payer: Self-pay | Admitting: Family Medicine

## 2021-12-12 DIAGNOSIS — N6489 Other specified disorders of breast: Secondary | ICD-10-CM

## 2021-12-12 DIAGNOSIS — R922 Inconclusive mammogram: Secondary | ICD-10-CM | POA: Diagnosis not present

## 2021-12-12 NOTE — Telephone Encounter (Signed)
Pt daughter returned call and states that Home Health called and was going to set up PT. Contacted Home Health and spoke with [facility]); they are unable to do just OT. 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 Larita Fife suggested [facility]. Look up contact information for [facility] and found [facility] [phone]-left message to return call regarding services for this patient. Daughter Darel Hong has been informed and verbalized understanding. Will keep Darel Hong updated ?

## 2021-12-13 NOTE — Telephone Encounter (Signed)
Julie Swanson returned call and states her services offer magnifiers, canes, large print items, talking products and different types of lighting. Nicki will call Darel Hong (pt daughter) and set up an appointment with them to go out and evaluate and see if pt can use any of their services.  ?

## 2021-12-29 IMAGING — MG MM DIGITAL DIAGNOSTIC UNILAT*L* W/ TOMO W/ CAD
8 series · 8 of 24 positions shown · non-contrast
Comparison: Previous exam(s).

CLINICAL DATA: [AGE] female presenting for short-term
follow-up of distortion in the left breast at the site of a prior
surgical excision performed in [REDACTED]. Biopsy was recommended for
this area in 2205 however the patient has declined and so the area
is being followed with short-term follow-up instead.

EXAM:
DIGITAL DIAGNOSTIC LEFT MAMMOGRAM WITH TOMO
ULTRASOUND LEFT BREAST

[L ML synth-2D]
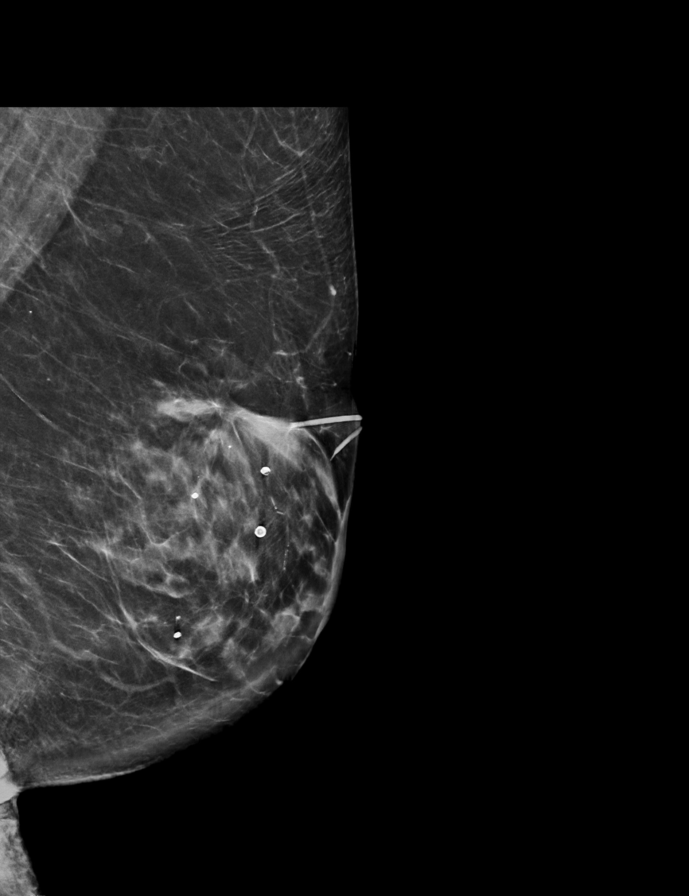

[L CC synth-2D (1 of 2)]
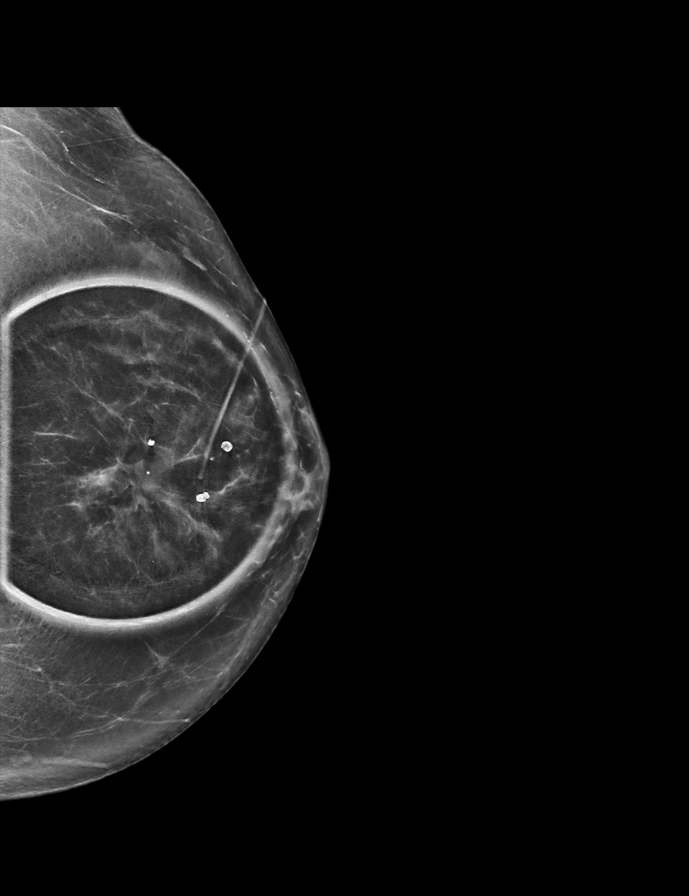

[L CC synth-2D (2 of 2)]
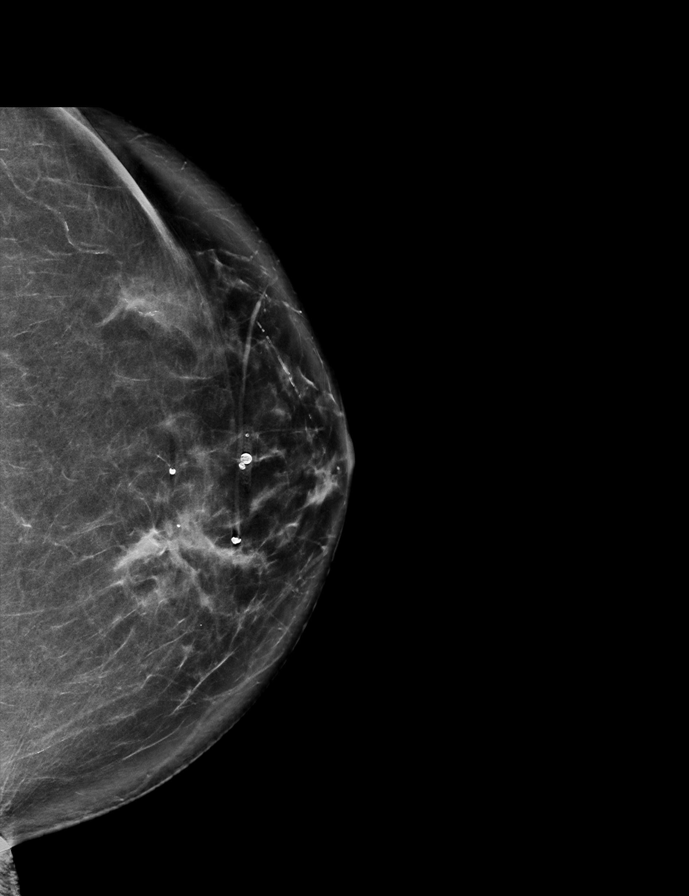

[L MLO synth-2D]
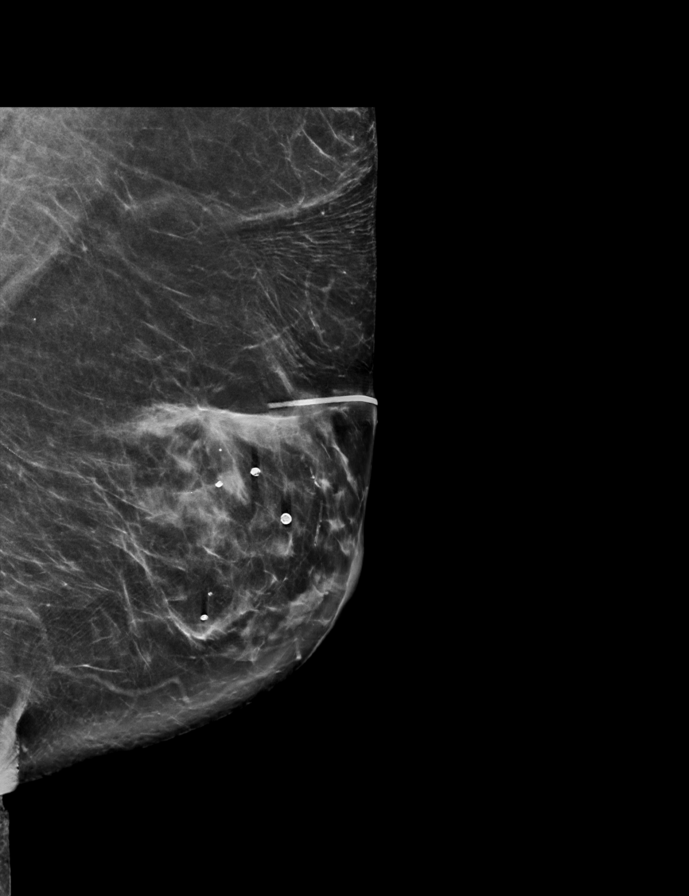

[L CC tomo (1 of 2) · tomo slice 33/65.0]
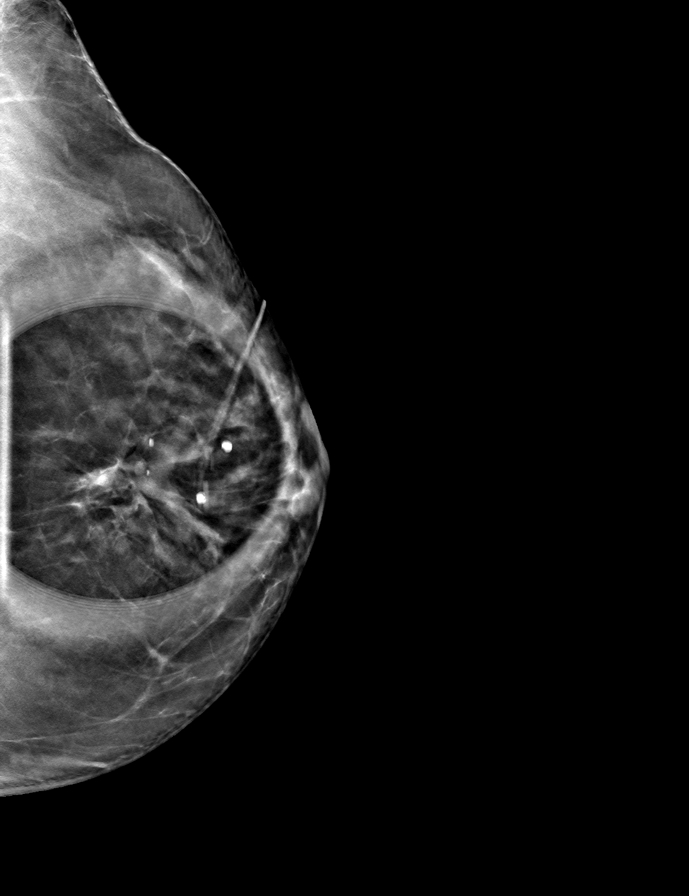

[L MLO tomo · tomo slice 37/74.0]
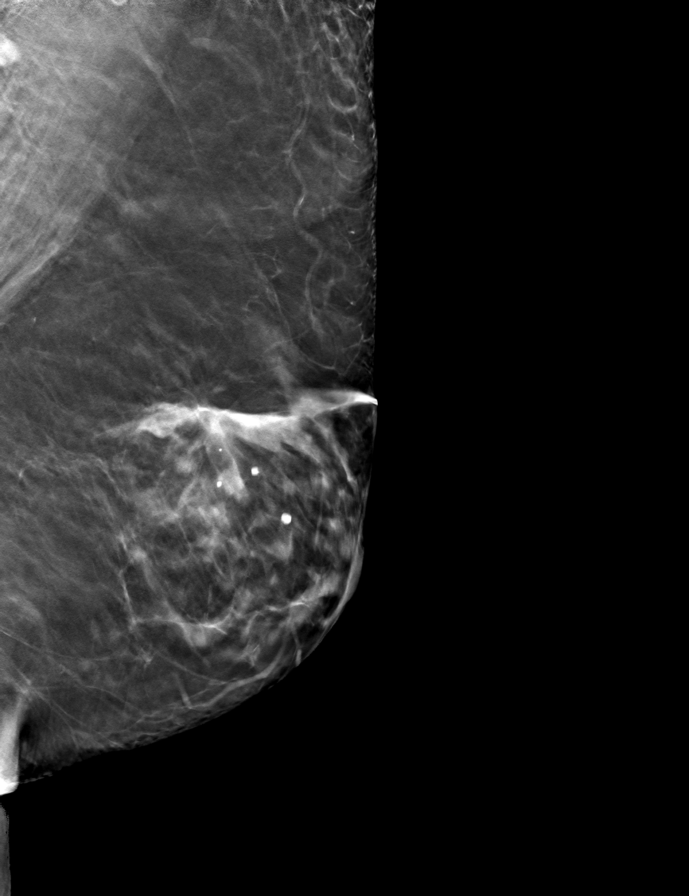

[L CC tomo (2 of 2) · tomo slice 37/74.0]
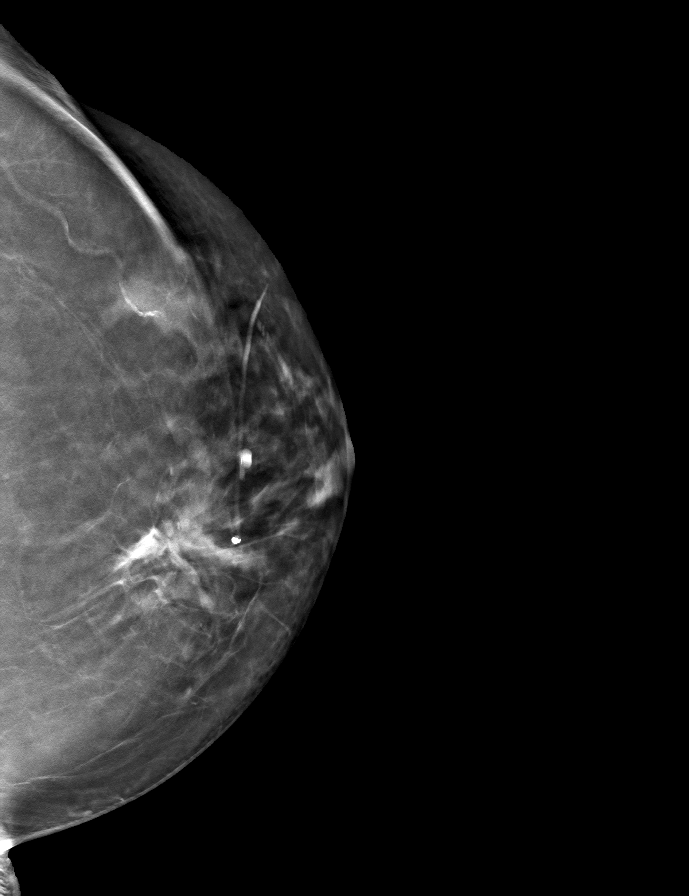

[L ML tomo · tomo slice 39/77.0]
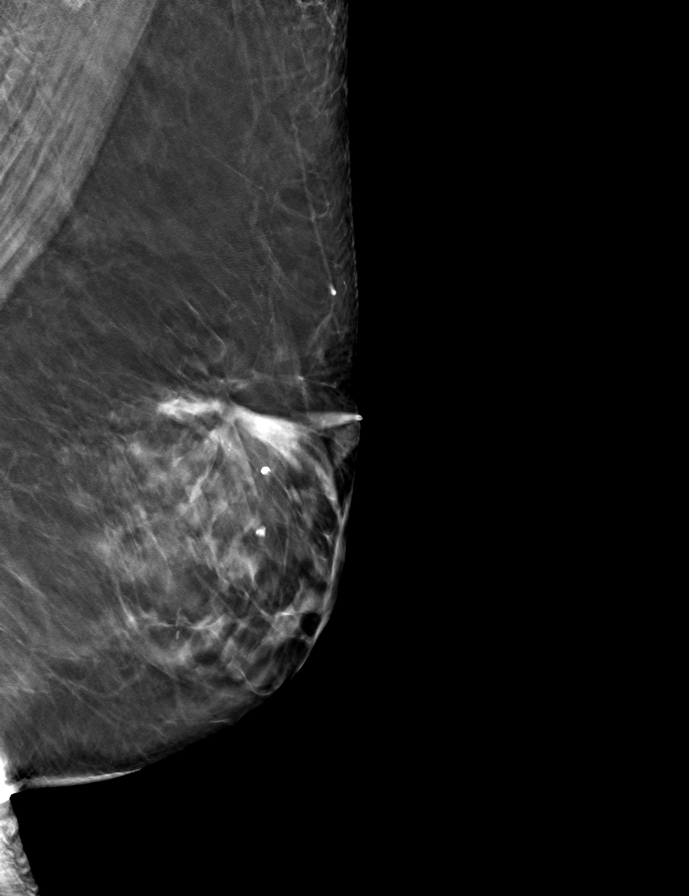

[8 of 24 positions shown; findings below may reference images not displayed]

ACR Breast Density Category b: There are scattered areas of
fibroglandular density.
FINDINGS: Mammogram:

Full field and spot compression tomosynthesis views of the left
breast were performed. The irregular mass with distortion in the
upper inner left breast is not significantly changed compared to the
most recent mammogram in November 2019 however appears increased in
density and conspicuity compared to October 2018.

On physical exam, I feel area of masslike thickening in the upper
inner left breast. There are no obvious skin changes.

Ultrasound:

Targeted ultrasound is performed in the left breast at 11 o'clock 4
cm from the nipple demonstrating an irregular hypoechoic mass
measuring approximately 2.1 x 1.0 x 1.8 cm, previously measuring
approximately 2.3 x 1.3 x 1.8 cm.

Targeted ultrasound of the left axilla demonstrates normal-appearing
lymph nodes.
IMPRESSION: Compared to the most recent mammogram and ultrasound from November 2019, the irregular mass with distortion in the upper inner left
breast is not significantly changed. However there has been an
increase in density over multiple prior mammograms. This area
remains suspicious for malignancy.

RECOMMENDATION:
Ultrasound-guided biopsy is recommended. The patient continues to
decline biopsy and prefers six-month follow-up. She will be due at
that time for bilateral mammogram.

I have discussed the findings and recommendations with the patient.
If applicable, a reminder letter will be sent to the patient
regarding the next appointment.

BI-RADS CATEGORY  4: Suspicious.

## 2022-01-30 ENCOUNTER — Ambulatory Visit: Payer: PPO | Admitting: Family Medicine

## 2022-02-02 ENCOUNTER — Ambulatory Visit: Payer: PPO | Admitting: Family Medicine

## 2022-02-03 ENCOUNTER — Telehealth: Payer: Self-pay

## 2022-02-03 NOTE — Telephone Encounter (Signed)
Daughter Bethena Roys [facility]) notified

## 2022-02-03 NOTE — Telephone Encounter (Signed)
Lab work was ordered in February this is still active it includes the A1c thank you

## 2022-02-03 NOTE — Telephone Encounter (Signed)
Pt's daughter called in stating that she would like to have lab work ordered to check pt's A1C at next appt 6/7. Please advise.

## 2022-02-07 ENCOUNTER — Ambulatory Visit: Payer: PPO | Admitting: Family Medicine

## 2022-02-13 ENCOUNTER — Other Ambulatory Visit: Payer: Self-pay | Admitting: *Deleted

## 2022-02-13 DIAGNOSIS — E1169 Type 2 diabetes mellitus with other specified complication: Secondary | ICD-10-CM | POA: Diagnosis not present

## 2022-02-13 DIAGNOSIS — E785 Hyperlipidemia, unspecified: Secondary | ICD-10-CM | POA: Diagnosis not present

## 2022-02-13 DIAGNOSIS — R739 Hyperglycemia, unspecified: Secondary | ICD-10-CM | POA: Diagnosis not present

## 2022-02-14 LAB — LIPID PANEL
Chol/HDL Ratio: 3.8 ratio (ref 0.0–4.4)
Cholesterol, Total: 121 mg/dL (ref 100–199)
HDL: 32 mg/dL — ABNORMAL LOW (ref >39)
LDL Chol Calc (NIH): 66 mg/dL (ref 0–99)
Triglycerides: 125 mg/dL (ref 0–149)
VLDL Cholesterol Cal: 23 mg/dL (ref 5–40)

## 2022-02-14 LAB — HEMOGLOBIN A1C
Est. average glucose Bld gHb Est-mCnc: 212 mg/dL
Hgb A1c MFr Bld: 9 % — ABNORMAL HIGH (ref 4.8–5.6)

## 2022-02-14 LAB — BASIC METABOLIC PANEL
BUN/Creatinine Ratio: 13 (ref 12–28)
BUN: 14 mg/dL (ref 10–36)
CO2: 26 mmol/L (ref 20–29)
Calcium: 9 mg/dL (ref 8.7–10.3)
Chloride: 96 mmol/L (ref 96–106)
Creatinine, Ser: 1.09 mg/dL — ABNORMAL HIGH (ref 0.57–1.00)
Glucose: 205 mg/dL — ABNORMAL HIGH (ref 70–99)
Potassium: 3.9 mmol/L (ref 3.5–5.2)
Sodium: 137 mmol/L (ref 134–144)
eGFR: 48 mL/min/{1.73_m2} — ABNORMAL LOW (ref >59)

## 2022-02-15 ENCOUNTER — Ambulatory Visit (INDEPENDENT_AMBULATORY_CARE_PROVIDER_SITE_OTHER): Payer: PPO | Admitting: Family Medicine

## 2022-02-15 VITALS — BP 112/65 | HR 91 | Temp 97.2°F | Ht 64.0 in | Wt 145.6 lb

## 2022-02-15 DIAGNOSIS — D509 Iron deficiency anemia, unspecified: Secondary | ICD-10-CM | POA: Diagnosis not present

## 2022-02-15 DIAGNOSIS — E1165 Type 2 diabetes mellitus with hyperglycemia: Secondary | ICD-10-CM | POA: Diagnosis not present

## 2022-02-15 DIAGNOSIS — R5383 Other fatigue: Secondary | ICD-10-CM | POA: Diagnosis not present

## 2022-02-15 DIAGNOSIS — Z91018 Allergy to other foods: Secondary | ICD-10-CM | POA: Diagnosis not present

## 2022-02-15 MED ORDER — LINAGLIPTIN 5 MG PO TABS: 5.0000 mg | ORAL_TABLET | Freq: Every day | ORAL | 5 refills | Status: DC

## 2022-02-15 NOTE — Progress Notes (Signed)
   Subjective:    Patient ID: Julie Swanson, female    DOB: July 06, 1928, 88 y.o.   MRN: [id number]  HPI  Patient here for 3 month follow up on diabetes. The patient states she has felt sluggish lately no energy.  Denies any chest tightness pressure pain shortness of breath.  Denies any significant swelling in legs. No energy. Pt was walking daily and has stopped. Has been going on for about a month.  Eye sight has declined more. Has macular degeneration that is getting worse  She also states she has alpha gal allergy and is very interested in seeing if she still has it she states she tested every several years I did discuss with her how testing really is unlikely to have her go back to normal and we agreed that we would do 1 more test but if this test was abnormal I would not recommend further testing Review of Systems     Objective:   Physical Exam General-in no acute distress Eyes-no discharge Lungs-respiratory rate normal, CTA CV-no murmurs,RRR Extremities skin warm dry no edema Neuro grossly normal Behavior normal, alert        Assessment & Plan:   1. Other fatigue Patient with significant fatigue tiredness we will check CBC It is quite possible this is all related to the diabetes  - Alpha-Gal Panel - CBC with Differential - Iron Binding Cap (TIBC)([facility]) - Ferritin  2. Poorly controlled type 2 diabetes mellitus (HCC) Poorly controlled Patient in the past has not tolerated medicines well She is not a good candidate for metformin Not a good candidate for insulin or glipizide We will try Tradjenta With Januvia we made her feel bad But she did not have any true allergic reaction to Januvia Hopefully she will tolerate Tradjenta Continue healthy eating - Alpha-Gal Panel - CBC with Differential - Iron Binding Cap (TIBC)([facility]) - Ferritin  3. Iron deficiency anemia, unspecified iron deficiency anemia type Because of fatigue and tiredness  check lab work - Alpha-Gal Panel - CBC with Differential - Iron Binding Cap (TIBC)([facility]) - Ferritin  4. Allergy to alpha-gal Has a history of this but she is uncertain if she still has the issue or not I told her we would check a panel but if that came back abnormal I would not recommend checking future panels - Alpha-Gal Panel - CBC with Differential - Iron Binding Cap (TIBC)([facility]) - Ferritin  Patient to do follow-up by mid September

## 2022-02-18 LAB — CBC WITH DIFFERENTIAL/PLATELET
Basophils Absolute: 0.1 10*3/uL (ref 0.0–0.2)
Basos: 1 %
EOS (ABSOLUTE): 0.1 10*3/uL (ref 0.0–0.4)
Eos: 2 %
Hematocrit: 36.5 % (ref 34.0–46.6)
Hemoglobin: 12 g/dL (ref 11.1–15.9)
Immature Grans (Abs): 0 10*3/uL (ref 0.0–0.1)
Immature Granulocytes: 0 %
Lymphocytes Absolute: 1.1 10*3/uL (ref 0.7–3.1)
Lymphs: 18 %
MCH: 27.5 pg (ref 26.6–33.0)
MCHC: 32.9 g/dL (ref 31.5–35.7)
MCV: 84 fL (ref 79–97)
Monocytes Absolute: 0.6 10*3/uL (ref 0.1–0.9)
Monocytes: 9 %
Neutrophils Absolute: 4.6 10*3/uL (ref 1.4–7.0)
Neutrophils: 70 %
Platelets: 256 10*3/uL (ref 150–450)
RBC: 4.36 x10E6/uL (ref 3.77–5.28)
RDW: 15.2 % (ref 11.7–15.4)
WBC: 6.4 10*3/uL (ref 3.4–10.8)

## 2022-02-18 LAB — IRON AND TIBC
Iron Saturation: 14 % — ABNORMAL LOW (ref 15–55)
Iron: 65 ug/dL (ref 27–139)
Total Iron Binding Capacity: 472 ug/dL — ABNORMAL HIGH (ref 250–450)
UIBC: 407 ug/dL — ABNORMAL HIGH (ref 118–369)

## 2022-02-18 LAB — ALPHA-GAL PANEL
Allergen Lamb IgE: 0.49 kU/L — AB
Beef IgE: 1.21 kU/L — AB
IgE (Immunoglobulin E), Serum: 32 IU/mL (ref 6–495)
O215-IgE Alpha-Gal: 7.79 kU/L — AB
Pork IgE: 0.51 kU/L — AB

## 2022-02-18 LAB — FERRITIN: Ferritin: 29 ng/mL (ref 15–150)

## 2022-02-20 ENCOUNTER — Encounter: Payer: Self-pay | Admitting: Family Medicine

## 2022-03-11 ENCOUNTER — Other Ambulatory Visit: Payer: Self-pay | Admitting: Family Medicine

## 2022-05-23 ENCOUNTER — Other Ambulatory Visit: Payer: Self-pay | Admitting: *Deleted

## 2022-05-23 DIAGNOSIS — E1122 Type 2 diabetes mellitus with diabetic chronic kidney disease: Secondary | ICD-10-CM | POA: Diagnosis not present

## 2022-05-23 DIAGNOSIS — N1831 Chronic kidney disease, stage 3a: Secondary | ICD-10-CM | POA: Diagnosis not present

## 2022-05-24 LAB — HEMOGLOBIN A1C
Est. average glucose Bld gHb Est-mCnc: 171 mg/dL
Hgb A1c MFr Bld: 7.6 % — ABNORMAL HIGH (ref 4.8–5.6)

## 2022-05-25 ENCOUNTER — Ambulatory Visit (INDEPENDENT_AMBULATORY_CARE_PROVIDER_SITE_OTHER): Payer: PPO | Admitting: Family Medicine

## 2022-05-25 ENCOUNTER — Encounter: Payer: Self-pay | Admitting: Family Medicine

## 2022-05-25 VITALS — BP 113/71 | Wt 147.6 lb

## 2022-05-25 DIAGNOSIS — Z23 Encounter for immunization: Secondary | ICD-10-CM

## 2022-05-25 DIAGNOSIS — E1169 Type 2 diabetes mellitus with other specified complication: Secondary | ICD-10-CM

## 2022-05-25 DIAGNOSIS — E785 Hyperlipidemia, unspecified: Secondary | ICD-10-CM

## 2022-05-25 DIAGNOSIS — I10 Essential (primary) hypertension: Secondary | ICD-10-CM | POA: Diagnosis not present

## 2022-05-25 DIAGNOSIS — N6321 Unspecified lump in the left breast, upper outer quadrant: Secondary | ICD-10-CM

## 2022-05-25 DIAGNOSIS — I4891 Unspecified atrial fibrillation: Secondary | ICD-10-CM

## 2022-05-25 MED ORDER — LINAGLIPTIN 5 MG PO TABS: 5.0000 mg | ORAL_TABLET | Freq: Every day | ORAL | 2 refills | Status: DC

## 2022-05-25 NOTE — Progress Notes (Signed)
   Subjective:    Patient ID: Julie Swanson, female    DOB: Sep 02, 1928, 88 y.o.   MRN: [id number]  HPI Pt arrives for follow up. Pt daughter states that pt is not as active as she used to be. Eye doctor states pt is "legally blind".   Pt still fatigued at times. Pt states that since she can not see, it bothers her. Sugars have been OK-ranging around 180 in the mornings; before lunch goes down to about 160. Sugars have spike to 190.   Review of Systems     Objective:   Physical Exam Lungs clear heart irregular breast mass noted left side approximately 1/2 inch x 1 inch no ulcerations no adenopathy in the axilla area  Greater than 30 minutes spent discussing multiple issues [id number]     Assessment & Plan:   1. Need for vaccination Vaccine today - Flu Vaccine QUAD High Dose(Fluad)  2. Mass of upper outer quadrant of left breast Upon shared discussion patient does not want to do any further mammograms she also does not want to do any biopsies she states she is fully aware that this could be cancer it could progress it could take her life but she does not want to do any other intervention daughter was present and understands  3. HYPERTENSION, BENIGN Blood pressure good control continue current measures atrial fibrillation under good control  4. Atrial fibrillation, unspecified type (HCC) Atrial fibs under good control talk  5. Hyperlipidemia associated with type 2 diabetes mellitus (HCC) Patient does not want to be on statin.  She is in her 90s.  Continue healthy diet Vaccine

## 2022-06-07 ENCOUNTER — Other Ambulatory Visit (HOSPITAL_COMMUNITY): Payer: Self-pay | Admitting: Cardiology

## 2022-06-13 ENCOUNTER — Encounter (HOSPITAL_COMMUNITY): Payer: Self-pay | Admitting: Cardiology

## 2022-06-13 ENCOUNTER — Ambulatory Visit (HOSPITAL_COMMUNITY)
Admission: RE | Admit: 2022-06-13 | Discharge: 2022-06-13 | Disposition: A | Discharge status: Home / Self Care | Payer: PPO | Source: Ambulatory Visit | Attending: Cardiology | Admitting: Cardiology

## 2022-06-13 VITALS — BP 120/62 | HR 86 | Wt 146.6 lb

## 2022-06-13 DIAGNOSIS — Z79899 Other long term (current) drug therapy: Secondary | ICD-10-CM | POA: Insufficient documentation

## 2022-06-13 DIAGNOSIS — R609 Edema, unspecified: Secondary | ICD-10-CM | POA: Diagnosis not present

## 2022-06-13 DIAGNOSIS — Z7901 Long term (current) use of anticoagulants: Secondary | ICD-10-CM | POA: Diagnosis not present

## 2022-06-13 DIAGNOSIS — I110 Hypertensive heart disease with heart failure: Secondary | ICD-10-CM | POA: Insufficient documentation

## 2022-06-13 DIAGNOSIS — I480 Paroxysmal atrial fibrillation: Secondary | ICD-10-CM | POA: Insufficient documentation

## 2022-06-13 DIAGNOSIS — I4819 Other persistent atrial fibrillation: Secondary | ICD-10-CM | POA: Diagnosis not present

## 2022-06-13 DIAGNOSIS — E785 Hyperlipidemia, unspecified: Secondary | ICD-10-CM | POA: Insufficient documentation

## 2022-06-13 DIAGNOSIS — I5032 Chronic diastolic (congestive) heart failure: Secondary | ICD-10-CM | POA: Diagnosis not present

## 2022-06-13 LAB — CBC
HCT: 35.6 % — ABNORMAL LOW (ref 36.0–46.0)
Hemoglobin: 11.9 g/dL — ABNORMAL LOW (ref 12.0–15.0)
MCH: 29.9 pg (ref 26.0–34.0)
MCHC: 33.4 g/dL (ref 30.0–36.0)
MCV: 89.4 fL (ref 80.0–100.0)
Platelets: 210 10*3/uL (ref 150–400)
RBC: 3.98 MIL/uL (ref 3.87–5.11)
RDW: 14.8 % (ref 11.5–15.5)
WBC: 5.1 10*3/uL (ref 4.0–10.5)
nRBC: 0 % (ref 0.0–0.2)

## 2022-06-13 LAB — BASIC METABOLIC PANEL
Anion gap: 10 (ref 5–15)
BUN: 10 mg/dL (ref 8–23)
CO2: 27 mmol/L (ref 22–32)
Calcium: 8.7 mg/dL — ABNORMAL LOW (ref 8.9–10.3)
Chloride: 101 mmol/L (ref 98–111)
Creatinine, Ser: 0.92 mg/dL (ref 0.44–1.00)
GFR, Estimated: 58 mL/min — ABNORMAL LOW (ref >60)
Glucose, Bld: 136 mg/dL — ABNORMAL HIGH (ref 70–99)
Potassium: 3.4 mmol/L — ABNORMAL LOW (ref 3.5–5.1)
Sodium: 138 mmol/L (ref 135–145)

## 2022-06-13 NOTE — Patient Instructions (Signed)
There has been no changes to your medications  Labs done today, your results will be available in MyChart, we will contact you for abnormal readings.  Your physician recommends that you schedule a follow-up appointment in: 6 months ( April 2024) ** please call the office in February 2024 to arrange your follow up appointment **  If you have any questions or concerns before your next appointment please send [place] a message through [place] or call our office at [phone].    TO LEAVE A MESSAGE FOR THE NURSE SELECT OPTION 2, PLEASE LEAVE A MESSAGE INCLUDING: YOUR NAME DATE OF BIRTH CALL BACK NUMBER REASON FOR CALL**this is important as we prioritize the call backs  YOU WILL RECEIVE A CALL BACK THE SAME DAY AS LONG AS YOU CALL BEFORE 4:00 PM  At the [facility], you and your health needs are our priority. As part of our continuing mission to provide you with exceptional heart care, we have created designated Provider Care Teams. These Care Teams include your primary Cardiologist (physician) and Advanced Practice Providers (APPs- Physician Assistants and Nurse Practitioners) who all work together to provide you with the care you need, when you need it.   You may see any of the following providers on your designated Care Team at your next follow up: Dr Glori Bickers Dr Loralie Champagne Dr. Roxana Hires, NP Lyda Jester, [place] [facility], [place] Forestine Na, NP Audry Riles, PharmD   Please be sure to bring in all your medications bottles to every appointment.

## 2022-06-13 NOTE — Progress Notes (Signed)
Patient ID: Julie Swanson, female   DOB: 03-03-1928, 88 y.o.   MRN: [id number] PCP: Dr. Sallee Lange Cardiology: Dr. Aundra Dubin  88 y.o. presents for followup of paroxysmal atrial fibrillation and diastolic CHF.  She had been having runs of rapid tachypalpitations when I saw her initially.  She was quite symptomatic with episodes.  Event monitor showed runs of atrial fibrillation with RVR.  I started her on Multaq for rhythm control given her significant symptoms and on apixaban.  She did not tolerate diltiazem due to ankle swelling and was switched over to Coreg for rate control.  She did not tolerate Coreg and is back on propranolol, which she does ok with.  Echo in 11/14 showed EF 60-65% with mild MR. No exertional chest pain or dyspnea. No lightheadedness.  She went into atrial fibrillation again in 8/16.  She had DCCV in 9/16 back to NSR and was continued on Multaq.  However, she went back into atrial fibrillation shortly after on despite Multaq use.  Multaq was stopped.  Given ongoing fatigue with atrial fibrillation, she was started on amiodarone.  DCCV was done in 12/16, she went back into NSR.  Propranolol was stopped due to bradycardia.  Unfortunately, atrial fibrillation has recurred.  She saw Richardson Dopp and it was decided to try to reload her on amiodarone and attempt DCCV one more time.  Later in 12/16, I cardioverted her again while on amiodarone.  However, at 09/10/15 appt in atrial fibrillation clinic, she was back in atrial fibrillation.  She developed a tremor on amiodarone.  Amiodarone was stopped with plan to rate control/anticoagulate going forward.  She is now on Toprol XL and diltiazem CD.  Echo in 2/19 showed EF 60-65%, mild LVH, moderate TR, PASP 37 mmHg.   She had an abnormal mammogram but has not wanted to have a breast biopsy. She is willing to have serial mammograms.   She remains in atrial fibrillation, HR controlled. Weight is down 1 lb.  She tires easily.  She is fatigued after  walking for about 5 minutes on her treadmill.  She does this 3 times/week.  No chest pain.  No lightheadedness. No falls.   ECG (personally reviewed): atrial fibrillation, septal Qs, inferolateral TWIs  Labs (10/14): LDL 90, HDL 41, K 4.1, creatinine 0.9 Labs (4/15): LDL 74, HDL 40, K 4.1, creatinine 0.87 Labs (10/15): LDL 73, HDL 38 Labs (12/15): K 4, creatinine 0.9, HCT 36 Labs (8/16): K 4, creatinine 0.97, HCT 37.8, TSH normal Labs (9/16): K 4.6, creatinine 1.06, LDL 68, HDL 47 Labs (11/16): TSH normal, HCT 34.1, LFTs normal Labs (12/16): creatinine 0.96, HCT 35.9 Labs (5/17): K 4.1, creatinine 0.99, LDL 77, HDL 38 Labs (2/18): K 4, creatinine 1.05 Labs (4/18): BNP 321 Labs (5/18): K 3.9, creatinine 1.06, LDL 62 Labs (9/18): K 3.9, creatinine 1.04 Labs (2/19): hgb 12.9, K 3.9, creatinine 0.95 Labs (4/19): LDL 69, HDL 38 Labs (8/19): hgb 12.6, creatinine 0.98 Labs (9/19): K 3.8, creatinine 1.06, hgb 12.8 Labs (2/20): LDL 68, hgb 12.5 Labs (7/20): K 3.7, creatinine 0.97 Labs (10/20): K 3.9, creatinine 0.92, hgb 12.6 Labs (12/21): LDL 81, HDL 32, K 3.9, creatinine 0.96 Labs (9/22): K 4.3, creatinine 1.05, LDL 68 Labs (6/23): K 3.9, creatinine 1.09, LDL 66  PMH: 1. LHC (8/10) with minimal nonobstructive disease.  ETT (10/13) with 2'53" exercise, nonspecific ECG changes with no evidence for ischemia.  2. Mitral valve prolapse: Echo (11/14) with EF 60-65%, mild MR.  3. Atrial fibrillation:  Event monitor (10/13) with PACs only.  Event monitor (11/14) showed atrial fibrillation with RVR. She does not tolerate metoprolol (nightmares).  She had ankle swelling with diltiazem. Possible fatigue/diarrhea with Coreg. Holter (8/16) with persistent atrial fibrillation.  DCCV to NSR in 9/16.  Back in atrial fibrillation by 10/16 despite Multaq. DCCV to NSR x 2 in 12/16 on amiodarone, but atrial fibrillation recurred.  Amiodarone stopped. 4. HTN 5. Hyperlipidemia 6. Hysterectomy. 7. Carotid  disease: Carotid dopplers (1/15) with prominent plaque, mild stenosis.  - Carotid dopplers (2/20): mild BICA stenosis.  8. Internal hemorrhoids with episodic rectal bleeding.  9. Macular degeneration.  10. PFTs: Mild obstructive lung disease.  11. PAD: ABIs (2/17) were normal, but there was evidence for "developing fem-pop disease."  ABIs normal in 2/18.  12. Pancreatic cysts on CT.  13. Chronic diastolic CHF - Echo (1/61): EF 60-65%, mild LVH, moderate TR, PASP 37 mmHg.  59. H/o Lyme disease 15. Left breast mass: has decided not to work up.   SH: Widow, lives in [place], nonsmoker.  86 daughters.   FH: Mother with CVA, CAD.  Father with CAD.   ROS: All systems reviewed and negative except as per HPI.   Current Outpatient Medications  Medication Sig Dispense Refill   acetaminophen (TYLENOL) 500 MG tablet Take 500 mg by mouth 2 (two) times daily as needed for moderate pain.     atorvastatin (LIPITOR) 10 MG tablet TAKE 1 TABLET BY MOUTH EVERY DAY 90 tablet 3   blood glucose meter kit and supplies Dispense based on patient and insurance preference. Use to check sugars once daily Dx E11.9 1 each 0   cycloSPORINE (RESTASIS) 0.05 % ophthalmic emulsion Place 1 drop into both eyes 2 (two) times daily.     diltiazem (CARDIZEM CD) 240 MG 24 hr capsule TAKE 1 CAPSULE BY MOUTH EVERY DAY 90 capsule 3   ELIQUIS 5 MG TABS tablet TAKE (1) TABLET BY MOUTH TWICE DAILY. 180 tablet 0   furosemide (LASIX) 40 MG tablet Take 1.5 tablets (60 mg total) by mouth 2 (two) times daily. 270 tablet 3   glucose blood (ONETOUCH ULTRA) test strip USE TO TEST BLOOD SUGAR ONCE A DAY 100 strip 2   KLOR-CON M20 20 MEQ tablet TAKE 1 TABLET BY MOUTH EVERY DAY 90 tablet 6   Lancets (ONETOUCH DELICA PLUS WRUEAV40J) MISC USE TO TEST BLOOD SUGAR ONCE A DAY 100 each 1   linagliptin (TRADJENTA) 5 MG TABS tablet Take 1 tablet (5 mg total) by mouth daily. 90 tablet 2   Lutein 20 MG CAPS Take 1 capsule by mouth daily.      metoprolol succinate (TOPROL-XL) 50 MG 24 hr tablet TAKE 1 TABLET BY MOUTH TWICE A DAY FOLLOWING A MEAL 180 tablet 3   mometasone (ELOCON) 0.1 % cream Apply BID prn to rash 45 g 0   neomycin-polymyxin b-dexamethasone (MAXITROL) 3.5-10000-0.1 OINT      OVER THE COUNTER MEDICATION Med Name: BENE-FIBER Take two (2) teaspoons by mouth daily with 8 oz water.     Probiotic Product (PROBIOTIC ADVANCED PO) Take by mouth. Once per day     No current facility-administered medications for this encounter.    BP 120/62   Pulse 86   Wt 66.5 kg (146 lb 9.6 oz)   SpO2 99%   BMI 25.16 kg/m  General: NAD Neck: No JVD, no thyromegaly or thyroid nodule.  Lungs: Clear to auscultation bilaterally with normal respiratory effort. CV: Nondisplaced PMI.  Heart irregular  S1/S2, no S3/S4, no murmur.  1+ edema to knees.  No carotid bruit.  Normal pedal pulses.  Abdomen: Soft, nontender, no hepatosplenomegaly, no distention.  Skin: Intact without lesions or rashes.  Neurologic: Alert and oriented x 3.  Psych: Normal affect. Extremities: No clubbing or cyanosis.  HEENT: Normal.   Assessment/Plan: 1. Atrial fibrillation:  Chronic, has recurred after DCCV in 12/16 on amiodarone.  She has failed amiodarone and Multaq.  She is not a good sotalol or Tikosyn candidate as QTc has been prolonged.  We have decided on following a rate control and anticoagulation strategy. HR is controlled. - Continue Toprol XL 50 mg bid and diltiazem CD 240 mg daily.    - Continue Eliquis, CBC today.  2. HTN: BP controlled.  3. Hyperlipidemia: Good lipids 6/23.   4. Chronic diastolic CHF:  Chronic NYHA class II symptoms.  She is not volume overloaded on exam today. She has some peripheral edema, but think this is due to venous insufficiency.  - Continue Lasix 60 mg bid, BMET today.     - She needs to wear compression stockings during the day.  - She did not tolerate Jardiance.   Followup in 6 months with APP.   Loralie Champagne 06/13/2022

## 2022-06-15 ENCOUNTER — Other Ambulatory Visit: Payer: PPO

## 2022-06-28 ENCOUNTER — Encounter: Payer: Self-pay | Admitting: Family Medicine

## 2022-06-28 DIAGNOSIS — Z789 Other specified health status: Secondary | ICD-10-CM | POA: Insufficient documentation

## 2022-06-28 HISTORY — DX: Other specified health status: Z78.9

## 2022-09-01 ENCOUNTER — Telehealth: Payer: Self-pay | Admitting: Family Medicine

## 2022-09-01 NOTE — Telephone Encounter (Signed)
Pt daughter sent MyChart message: On another note. I dropped off Mom's Living Will sometime ago for your review. It was in a white folder with [facility] Living Will on the top right corner. Did you by chance get to look at that and can you provide any feedback. My number is [phone].   In regards to the living will please put this message into a phone message under her mother's name Julie Swanson Reasons-then I will be able to address the issue and look under media files-that was a while back I do not recall off the top my head-I recall the form but I do not recall the specific details at this point-lots of forms come through my desk every single day)

## 2022-09-06 NOTE — Telephone Encounter (Signed)
Please let Julie Swanson know that I did review over this.  I felt overall the living will looked good.  On her next follow-up visit we can discuss intricacies of living wills in the face of more complex health issues as a person gets older

## 2022-09-07 ENCOUNTER — Other Ambulatory Visit (HOSPITAL_COMMUNITY): Payer: Self-pay | Admitting: Cardiology

## 2022-09-07 NOTE — Telephone Encounter (Signed)
Daughter Darel Hong [facility]) notified and verbalized understanding. Patient has follow up scheduled 09/26/21

## 2022-09-12 ENCOUNTER — Telehealth: Payer: Self-pay | Admitting: Family Medicine

## 2022-09-12 DIAGNOSIS — E1169 Type 2 diabetes mellitus with other specified complication: Secondary | ICD-10-CM

## 2022-09-12 DIAGNOSIS — E876 Hypokalemia: Secondary | ICD-10-CM

## 2022-09-12 DIAGNOSIS — E1122 Type 2 diabetes mellitus with diabetic chronic kidney disease: Secondary | ICD-10-CM

## 2022-09-12 NOTE — Telephone Encounter (Signed)
It would be reasonable for her to do metabolic 7, lipid, H6W, urine ACR  Diagnosis diabetes, hyperlipidemia, hypokalemia

## 2022-09-12 NOTE — Telephone Encounter (Signed)
Last labs completed 05/23/22-A1C. Please advise. Thank you

## 2022-09-12 NOTE — Telephone Encounter (Signed)
Patient has appointment 1/16 will she need labs done before appointment

## 2022-09-12 NOTE — Telephone Encounter (Signed)
Lab orders placed and pt is aware 

## 2022-09-21 DIAGNOSIS — E1122 Type 2 diabetes mellitus with diabetic chronic kidney disease: Secondary | ICD-10-CM | POA: Diagnosis not present

## 2022-09-21 DIAGNOSIS — E876 Hypokalemia: Secondary | ICD-10-CM | POA: Diagnosis not present

## 2022-09-21 DIAGNOSIS — E1169 Type 2 diabetes mellitus with other specified complication: Secondary | ICD-10-CM | POA: Diagnosis not present

## 2022-09-21 DIAGNOSIS — E785 Hyperlipidemia, unspecified: Secondary | ICD-10-CM | POA: Diagnosis not present

## 2022-09-21 DIAGNOSIS — N1831 Chronic kidney disease, stage 3a: Secondary | ICD-10-CM | POA: Diagnosis not present

## 2022-09-22 LAB — BASIC METABOLIC PANEL
BUN/Creatinine Ratio: 13 (ref 12–28)
BUN: 12 mg/dL (ref 10–36)
CO2: 25 mmol/L (ref 20–29)
Calcium: 9.2 mg/dL (ref 8.7–10.3)
Chloride: 97 mmol/L (ref 96–106)
Creatinine, Ser: 0.95 mg/dL (ref 0.57–1.00)
Glucose: 166 mg/dL — ABNORMAL HIGH (ref 70–99)
Potassium: 3.8 mmol/L (ref 3.5–5.2)
Sodium: 137 mmol/L (ref 134–144)
eGFR: 56 mL/min/{1.73_m2} — ABNORMAL LOW (ref >59)

## 2022-09-22 LAB — MICROALBUMIN / CREATININE URINE RATIO
Creatinine, Urine: 63.1 mg/dL
Microalb/Creat Ratio: 52 mg/g creat — ABNORMAL HIGH (ref 0–29)
Microalbumin, Urine: 32.8 ug/mL

## 2022-09-22 LAB — HEMOGLOBIN A1C
Est. average glucose Bld gHb Est-mCnc: 166 mg/dL
Hgb A1c MFr Bld: 7.4 % — ABNORMAL HIGH (ref 4.8–5.6)

## 2022-09-22 LAB — LIPID PANEL
Chol/HDL Ratio: 3.7 ratio (ref 0.0–4.4)
Cholesterol, Total: 125 mg/dL (ref 100–199)
HDL: 34 mg/dL — ABNORMAL LOW (ref >39)
LDL Chol Calc (NIH): 65 mg/dL (ref 0–99)
Triglycerides: 149 mg/dL (ref 0–149)
VLDL Cholesterol Cal: 26 mg/dL (ref 5–40)

## 2022-09-26 ENCOUNTER — Ambulatory Visit (INDEPENDENT_AMBULATORY_CARE_PROVIDER_SITE_OTHER): Payer: PPO | Admitting: Family Medicine

## 2022-09-26 VITALS — BP 138/70 | HR 71 | Temp 97.6°F | Ht 64.0 in | Wt 148.2 lb

## 2022-09-26 DIAGNOSIS — N1831 Chronic kidney disease, stage 3a: Secondary | ICD-10-CM | POA: Diagnosis not present

## 2022-09-26 DIAGNOSIS — I4819 Other persistent atrial fibrillation: Secondary | ICD-10-CM

## 2022-09-26 DIAGNOSIS — E1122 Type 2 diabetes mellitus with diabetic chronic kidney disease: Secondary | ICD-10-CM

## 2022-09-26 DIAGNOSIS — H548 Legal blindness, as defined in USA: Secondary | ICD-10-CM | POA: Diagnosis not present

## 2022-09-26 DIAGNOSIS — E785 Hyperlipidemia, unspecified: Secondary | ICD-10-CM | POA: Diagnosis not present

## 2022-09-26 DIAGNOSIS — E1169 Type 2 diabetes mellitus with other specified complication: Secondary | ICD-10-CM | POA: Diagnosis not present

## 2022-09-26 DIAGNOSIS — J438 Other emphysema: Secondary | ICD-10-CM

## 2022-09-26 DIAGNOSIS — N6321 Unspecified lump in the left breast, upper outer quadrant: Secondary | ICD-10-CM

## 2022-09-26 DIAGNOSIS — I5032 Chronic diastolic (congestive) heart failure: Secondary | ICD-10-CM

## 2022-09-26 DIAGNOSIS — Z7189 Other specified counseling: Secondary | ICD-10-CM | POA: Diagnosis not present

## 2022-09-26 NOTE — Progress Notes (Signed)
   Subjective:    Patient ID: Julie Swanson, female    DOB: [birth year], 89 y.o.   MRN: [id number]  HPI Patient arrives today for 4 mouth follow up on labs.  Patient states no concerns or issues. Discuss living will. Mass of upper outer quadrant of left breast  Type 2 diabetes mellitus with stage 3a chronic kidney disease, without long-term current use of insulin (HCC)  Hyperlipidemia associated with type 2 diabetes mellitus ([place])  Legal blindness  Other emphysema (HCC), Chronic  Chronic diastolic heart failure (HCC), Chronic  Persistent atrial fibrillation ([place]), Chronic  Counseling regarding advance directives and goals of care  Patient dealing with the stress of losing her vision 1 previous evaluation she had early signs of emphysema on a pulmonary function test she states her breathing is doing well currently She does have chronic diastolic heart failure on medications seemingly stable currently but she does get out of breath with walking She does have atrial fibs and she thinks her heart rate is under decent control In addition to this has type 2 diabetes and hyperlipidemia with stage III kidney disease In addition this has a mass of the breast that she does not want to do any biopsy   Review of Systems     Objective:   Physical Exam  General-in no acute distress Eyes-no discharge Lungs-respiratory rate normal, CTA CV-no murmurs,RRR Extremities skin warm dry no edema Neuro grossly normal Behavior normal, alert On physical exam there is a breast nodule that is approximately an inch and a half by an inch and 1/2 inch in depth there is no axilla nodules  Labs were reviewed in detail    Assessment & Plan:  1. Mass of upper outer quadrant of left breast She is chosen not to get biopsy or any treatment on this she would like to preserve what health she has but not do any aggressive treatments  2. Type 2 diabetes mellitus with stage 3a chronic kidney disease, without  long-term current use of insulin (HCC) Diabetes fair control on current medication watch diet no need to add additional medicines currently  3. Hyperlipidemia associated with type 2 diabetes mellitus ([place]) She is taking statin continue this currently recent lab work looks good  4. Legal blindness Quality of life affected by this.  Family is very supportive of her.  5. Other emphysema ([place]) Previous pulmonary function test showed restrictive airways consistent with early emphysema by cardiologist stable currently  6. Chronic diastolic heart failure ([place]) Patient does get out of breath with walking long distances but able to do her house activities without difficulty with breathing  7. Persistent atrial fibrillation (HCC) Heart rate is controlled followed by cardiology  Recheck in approximately 4 to 5 months Minimum 30 minutes spent reviewing chart documenting and time spent with patient End-of-life issues were discussed in detail.  Patient does not want to have any measures taken to prolong life

## 2022-11-09 ENCOUNTER — Encounter (INDEPENDENT_AMBULATORY_CARE_PROVIDER_SITE_OTHER): Payer: PPO | Admitting: Ophthalmology

## 2022-11-09 DIAGNOSIS — H353124 Nonexudative age-related macular degeneration, left eye, advanced atrophic with subfoveal involvement: Secondary | ICD-10-CM | POA: Diagnosis not present

## 2022-11-09 DIAGNOSIS — H26491 Other secondary cataract, right eye: Secondary | ICD-10-CM | POA: Diagnosis not present

## 2022-11-09 DIAGNOSIS — H353114 Nonexudative age-related macular degeneration, right eye, advanced atrophic with subfoveal involvement: Secondary | ICD-10-CM | POA: Diagnosis not present

## 2022-11-14 ENCOUNTER — Other Ambulatory Visit (HOSPITAL_COMMUNITY): Payer: Self-pay | Admitting: Cardiology

## 2022-11-16 DIAGNOSIS — H26491 Other secondary cataract, right eye: Secondary | ICD-10-CM | POA: Diagnosis not present

## 2022-12-22 ENCOUNTER — Ambulatory Visit (INDEPENDENT_AMBULATORY_CARE_PROVIDER_SITE_OTHER): Payer: PPO

## 2022-12-22 VITALS — Ht 64.0 in | Wt 148.0 lb

## 2022-12-22 DIAGNOSIS — Z0000 Encounter for general adult medical examination without abnormal findings: Secondary | ICD-10-CM | POA: Diagnosis not present

## 2022-12-22 NOTE — Progress Notes (Signed)
Subjective:   Julie Swanson is a 89 y.o. female who presents for Medicare Annual (Subsequent) preventive examination.  I connected with  Julie Swanson on 12/22/22 by a audio enabled telemedicine application and verified that I am speaking with the correct person using two identifiers.  Patient Location: Home  Provider Location: Office/Clinic  I discussed the limitations of evaluation and management by telemedicine. The patient expressed understanding and agreed to proceed.  Review of Systems     Cardiac Risk Factors include: advanced age (>52men, >67 women);dyslipidemia;hypertension     Objective:    Today's Vitals   12/22/22 1304  Weight: 148 lb (67.1 kg)  Height: 5\' 4"  (1.626 m)   Body mass index is 25.4 kg/m.     12/06/2021    1:12 PM 08/31/2015   12:04 PM 08/13/2015   10:51 AM 05/13/2015   10:15 AM  Advanced Directives  Does Patient Have a Medical Advance Directive? Yes Yes Yes Yes  Type of Estate agent of [employer] Power of [place];Living will Healthcare Power of [place];Living will Healthcare Power of [place];Living will  Copy of Healthcare Power of Attorney in Chart? No - copy requested No - copy requested No - copy requested No - copy requested  Would patient like information on creating a medical advance directive? No - Patient declined       Current Medications (verified) Outpatient Encounter Medications as of 12/22/2022  Medication Sig   acetaminophen (TYLENOL) 500 MG tablet Take 500 mg by mouth 2 (two) times daily as needed for moderate pain.   atorvastatin (LIPITOR) 10 MG tablet TAKE (1) TABLET BY MOUTH ONCE DAILY.   blood glucose meter kit and supplies Dispense based on patient and insurance preference. Use to check sugars once daily Dx E11.9   cycloSPORINE (RESTASIS) 0.05 % ophthalmic emulsion Place 1 drop into both eyes 2 (two) times daily.   diltiazem (CARDIZEM CD) 240 MG 24 hr capsule TAKE (1) CAPSULE BY MOUTH ONCE  DAILY.   ELIQUIS 5 MG TABS tablet TAKE (1) TABLET BY MOUTH TWICE DAILY.   furosemide (LASIX) 40 MG tablet TAKE 1 AND 1/2 TABLETS BY MOUTH EVERY MORNING AND 1 AND 1/2 TABLET EVERY EVENING.   glucose blood (ONETOUCH ULTRA) test strip USE TO TEST BLOOD SUGAR ONCE A DAY   Lancets (ONETOUCH DELICA PLUS LANCET33G) MISC USE TO TEST BLOOD SUGAR ONCE A DAY   linagliptin (TRADJENTA) 5 MG TABS tablet Take 1 tablet (5 mg total) by mouth daily.   Lutein 20 MG CAPS Take 1 capsule by mouth daily.   metoprolol succinate (TOPROL-XL) 50 MG 24 hr tablet TAKE (1) TABLET BY MOUTH TWICE DAILY.   mometasone (ELOCON) 0.1 % cream Apply BID prn to rash   neomycin-polymyxin b-dexamethasone (MAXITROL) 3.5-10000-0.1 OINT    OVER THE COUNTER MEDICATION Med Name: BENE-FIBER Take two (2) teaspoons by mouth daily with 8 oz water.   potassium chloride SA (KLOR-CON M) 20 MEQ tablet TAKE (1) TABLET BY MOUTH ONCE DAILY.   Probiotic Product (PROBIOTIC ADVANCED PO) Take by mouth. Once per day   No facility-administered encounter medications on file as of 12/22/2022.    Allergies (verified) Valtrex [valacyclovir hcl]; Antihistamines, chlorpheniramine-type; Evista [raloxifene]; Feldene [piroxicam]; Januvia [sitagliptin]; Other; Toprol xl [metoprolol tartrate]; Tropicamide; and Alpha-gal   History: Past Medical History:  Diagnosis Date   Advance directive in chart 06/28/2022   Patient has advanced directive that was scanned into the system.  This advanced directive indicates to withdraw and withhold  life-prolonging measures if it is felt that she has an incurable or irreversible condition, unconscious and with high degree of medical uncertainty will never regain consciousness or suffer from advanced dementia.  Advanced directive scanned into the system   Arthritis    Atrial fibrillation    CAD (coronary artery disease)    Chest pain    CHF (congestive heart failure)    Complication of anesthesia    pt states "hard to wake up"    HLD (hyperlipidemia)    HTN (hypertension)    Hyperkalemia    Macular degeneration    MVP (mitral valve prolapse)    Osteoporosis    Palpitations    Past Surgical History:  Procedure Laterality Date   ABDOMINAL HYSTERECTOMY     BREAST EXCISIONAL BIOPSY Left    BREAST EXCISIONAL BIOPSY Right    CARDIOVERSION N/A 05/13/2015   Procedure: CARDIOVERSION;  Surgeon: Laurey Morale, MD;  Location: [facility] ENDOSCOPY;  Service: Cardiovascular;  Laterality: N/A;   CARDIOVERSION N/A 08/13/2015   Procedure: CARDIOVERSION;  Surgeon: Laurey Morale, MD;  Location: [facility] ENDOSCOPY;  Service: Cardiovascular;  Laterality: N/A;   CARDIOVERSION N/A 08/31/2015   Procedure: CARDIOVERSION;  Surgeon: Laurey Morale, MD;  Location: [facility] ENDOSCOPY;  Service: Cardiovascular;  Laterality: N/A;   CATARACT EXTRACTION     BOTH EYES   COLONOSCOPY     COLONOSCOPY N/A 08/14/2014   Procedure: COLONOSCOPY;  Surgeon: Malissa Hippo, MD;  Location: [facility] ENDO SUITE;  Service: Endoscopy;  Laterality: N/A;  225   CYSTECTOMY     BREAST   hysterectomy - unknown type     TOOTH EXTRACTION Left 12/30/2020   Family History  Problem Relation Age of Onset   Heart disease Mother    Stroke Mother        ane melanoma   Heart disease Father    Bladder Cancer Father    Cancer Brother    CAD Brother        ALL   Stroke Other    Heart attack Other    CAD Sister        X2   Social History   Socioeconomic History   Marital status: Widowed    Spouse name: Not on file   Number of children: 2   Years of education: Not on file   Highest education level: Not on file  Occupational History   Not on file  Tobacco Use   Smoking status: Never   Smokeless tobacco: Never  Substance and Sexual Activity   Alcohol use: No   Drug use: No   Sexual activity: Not on file  Other Topics Concern   Not on file  Social History Narrative   2 daughter, Alona Bene and Darel Hong.   1 grandson   1 step grandson   Social Determinants of Health    Financial Resource Strain: Low Risk  (12/22/2022)   Overall Financial Resource Strain (CARDIA)    Difficulty of Paying Living Expenses: Not hard at all  Food Insecurity: No Food Insecurity (12/22/2022)   Hunger Vital Sign    Worried About Running Out of Food in the Last Year: Never true    Ran Out of Food in the Last Year: Never true  Transportation Needs: No Transportation Needs (12/22/2022)   PRAPARE - Administrator, Civil Service (Medical): No    Lack of Transportation (Non-Medical): No  Physical Activity: Insufficiently Active (12/22/2022)   Exercise Vital Sign    Days of Exercise  per Week: 4 days    Minutes of Exercise per Session: 10 min  Stress: No Stress Concern Present (12/22/2022)   [employer] of Occupational Health - Occupational Stress Questionnaire    Feeling of Stress : Not at all  Social Connections: Moderately Isolated (12/22/2022)   Social Connection and Isolation Panel [NHANES]    Frequency of Communication with Friends and Family: More than three times a week    Frequency of Social Gatherings with Friends and Family: Three times a week    Attends Religious Services: 1 to 4 times per year    Active Member of Clubs or Organizations: No    Attends Banker Meetings: Never    Marital Status: Widowed    Tobacco Counseling Counseling given: Not Answered   Clinical Intake:  Pre-visit preparation completed: Yes  Pain : No/denies pain     Diabetes: No  How often do you need to have someone help you when you read instructions, pamphlets, or other written materials from your doctor or pharmacy?: 1 - Never  Diabetic?No   Interpreter Needed?: No  Information entered by :: Kandis Fantasia LPN   Activities of Daily Living    12/22/2022    1:24 PM  In your present state of health, do you have any difficulty performing the following activities:  Hearing? 0  Vision? 0  Difficulty concentrating or making decisions? 0  Walking or  climbing stairs? 0  Dressing or bathing? 0  Doing errands, shopping? 0  Preparing Food and eating ? N  Using the Toilet? N  In the past six months, have you accidently leaked urine? N  Do you have problems with loss of bowel control? N  Managing your Medications? N  Managing your Finances? N  Housekeeping or managing your Housekeeping? N    Patient Care Team: Babs Sciara, MD as PCP - General (Family Medicine) Laurey Morale, MD as Consulting Physician (Cardiology) Luciana Axe Alford Highland, MD as Consulting Physician (Ophthalmology) Hurshel Party, OD (Optometry)  Indicate any recent Medical Services you may have received from other than [facility] providers in the past year (date may be approximate).     Assessment:   This is a routine wellness examination for [place].  Hearing/Vision screen Hearing Screening - Comments:: Denies hearing difficulties   Vision Screening - Comments:: Wears rx glasses - up to date with routine eye exams with Dr. Carlynn Purl and Dr. Luciana Axe    Dietary issues and exercise activities discussed: Current Exercise Habits: Home exercise routine, Type of exercise: treadmill, Time (Minutes): 10, Frequency (Times/Week): 4, Weekly Exercise (Minutes/Week): 40, Intensity: Mild   Goals Addressed   None   Depression Screen    12/22/2022    1:21 PM 09/26/2022    8:13 AM 12/06/2021    1:09 PM 06/09/2021    8:52 AM 11/25/2020    8:36 AM 12/12/2017    8:36 AM 08/08/2017    8:28 AM  PHQ 2/9 Scores  PHQ - 2 Score 0 0 0 0 0 0 0  PHQ- 9 Score  3         Fall Risk    12/22/2022    1:06 PM 09/26/2022    8:12 AM 12/06/2021    1:15 PM 06/09/2021    8:52 AM 05/29/2020   11:14 AM  Fall Risk   Falls in the past year? 0 0 0 0 0  Number falls in past yr: 0 0 0    Injury with Fall? 0 0  0    Risk for fall due to : No Fall Risks  Impaired balance/gait Impaired balance/gait   Follow up Falls prevention discussed;Education provided;Falls evaluation completed  Falls prevention discussed  Falls evaluation completed Falls evaluation completed    FALL RISK PREVENTION PERTAINING TO THE HOME:  Any stairs in or around the home? No  If so, are there any without handrails? No  Home free of loose throw rugs in walkways, pet beds, electrical cords, etc? Yes  Adequate lighting in your home to reduce risk of falls? Yes   ASSISTIVE DEVICES UTILIZED TO PREVENT FALLS:  Life alert? No  Use of a cane, walker or w/c? No  Grab bars in the bathroom? Yes  Shower chair or bench in shower? No  Elevated toilet seat or a handicapped toilet? Yes   TIMED UP AND GO:  Was the test performed? No . Telephonic visit   Cognitive Function:        12/22/2022    1:25 PM 12/06/2021    1:22 PM  6CIT Screen  What Year? 0 points 0 points  What month? 0 points 0 points  What time? 0 points 0 points  Count back from 20 0 points 0 points  Months in reverse 0 points 0 points  Repeat phrase 0 points 2 points  Total Score 0 points 2 points    Immunizations Immunization History  Administered Date(s) Administered   Fluad Quad(high Dose 65+) 05/25/2022   Influenza Split 07/09/2013   Influenza,inj,Quad PF,6+ Mos 06/19/2014, 05/24/2015, 07/13/2016, 05/15/2018, 06/14/2019, 06/19/2020, 07/07/2021   Influenza-Unspecified 07/10/2012, 07/03/2017   Moderna Covid-19 Vaccine Bivalent Booster 36yrs & up 08/19/2021   Moderna Sars-Covid-2 Vaccination 11/19/2019, 12/20/2019, 09/01/2020   Pneumococcal Conjugate-13 06/19/2014   Pneumococcal Polysaccharide-23 09/10/1996    TDAP status: Due, Education has been provided regarding the importance of this vaccine. Advised may receive this vaccine at local pharmacy or Health Dept. Aware to provide a copy of the vaccination record if obtained from local pharmacy or Health Dept. Verbalized acceptance and understanding.  Flu Vaccine status: Up to date  Pneumococcal vaccine status: Up to date  Covid-19 vaccine status: Information provided on how to obtain vaccines.    Qualifies for Shingles Vaccine? Yes   Zostavax completed No   Shingrix Completed?: No.    Education has been provided regarding the importance of this vaccine. Patient has been advised to call insurance company to determine out of pocket expense if they have not yet received this vaccine. Advised may also receive vaccine at local pharmacy or Health Dept. Verbalized acceptance and understanding.  Screening Tests Health Maintenance  Topic Date Due   DTaP/Tdap/Td (1 - Tdap) Never done   Zoster Vaccines- Shingrix (1 of 2) Never done   COVID-19 Vaccine (5 - 2023-24 season) 01/07/2023 (Originally 05/12/2022)   INFLUENZA VACCINE  04/12/2023   Medicare Annual Wellness (AWV)  12/22/2023   Pneumonia Vaccine 56+ Years old  Completed   DEXA SCAN  Completed   HPV VACCINES  Aged Out    Health Maintenance  Health Maintenance Due  Topic Date Due   DTaP/Tdap/Td (1 - Tdap) Never done   Zoster Vaccines- Shingrix (1 of 2) Never done    Colorectal cancer screening: No longer required.   Mammogram status: No longer required due to age.  Bone Density status: Completed 05/28/15. Results reflect: Bone density results: OSTEOPENIA. Repeat every   years.  Lung Cancer Screening: (Low Dose CT Chest recommended if Age 61-80 years, 30 pack-year currently smoking OR  have quit w/in 15years.) does not qualify.   Lung Cancer Screening Referral: n/a  Additional Screening:  Hepatitis C Screening: does not qualify  Vision Screening: Recommended annual ophthalmology exams for early detection of glaucoma and other disorders of the eye. Is the patient up to date with their annual eye exam?  Yes  Who is the provider or what is the name of the office in which the patient attends annual eye exams? Dr. Carlynn Purl and Dr. Luciana Axe  If pt is not established with a provider, would they like to be referred to a provider to establish care? No .   Dental Screening: Recommended annual dental exams for proper oral  hygiene  Community Resource Referral / Chronic Care Management: CRR required this visit?  No   CCM required this visit?  No      Plan:     I have personally reviewed and noted the following in the patient's chart:   Medical and social history Use of alcohol, tobacco or illicit drugs  Current medications and supplements including opioid prescriptions. Patient is not currently taking opioid prescriptions. Functional ability and status Nutritional status Physical activity Advanced directives List of other physicians Hospitalizations, surgeries, and ER visits in previous 12 months Vitals Screenings to include cognitive, depression, and falls Referrals and appointments  In addition, I have reviewed and discussed with patient certain preventive protocols, quality metrics, and best practice recommendations. A written personalized care plan for preventive services as well as general preventive health recommendations were provided to patient.     Durwin Nora, [place]   12/18/8117   Due to this being a virtual visit, the after visit summary with patients personalized plan was offered to patient via mail or my-chart.  per request, patient was mailed a copy of AVS.  Nurse Notes: No concerns

## 2022-12-22 NOTE — Patient Instructions (Signed)
Julie Swanson , Thank you for taking time to come for your Medicare Wellness Visit. I appreciate your ongoing commitment to your health goals. Please review the following plan we discussed and let me know if I can assist you in the future.   These are the goals we discussed:  Goals      Exercise 3x per week (30 min per time)     Try to increase as tolerated.         This is a list of the screening recommended for you and due dates:  Health Maintenance  Topic Date Due   DTaP/Tdap/Td vaccine (1 - Tdap) Never done   Zoster (Shingles) Vaccine (1 of 2) Never done   Eye exam for diabetics  05/13/2015   Medicare Annual Wellness Visit  12/07/2022   COVID-19 Vaccine (5 - 2023-24 season) 01/07/2023*   Hemoglobin A1C  03/22/2023   Flu Shot  04/12/2023   Pneumonia Vaccine  Completed   DEXA scan (bone density measurement)  Completed   HPV Vaccine  Aged Out   Complete foot exam   Discontinued  *Topic was postponed. The date shown is not the original due date.    Advanced directives: We have a copy of your advanced directives available in your record should your provider ever need to access them.   Conditions/risks identified: Aim for 30 minutes of exercise or brisk walking, 6-8 glasses of water, and 5 servings of fruits and vegetables each day.   Next appointment: Follow up in one year for your annual wellness visit    Preventive Care 65 Years and Older, Female Preventive care refers to lifestyle choices and visits with your health care provider that can promote health and wellness. What does preventive care include? A yearly physical exam. This is also called an annual well check. Dental exams once or twice a year. Routine eye exams. Ask your health care provider how often you should have your eyes checked. Personal lifestyle choices, including: Daily care of your teeth and gums. Regular physical activity. Eating a healthy diet. Avoiding tobacco and drug use. Limiting alcohol  use. Practicing safe sex. Taking low-dose aspirin every day. Taking vitamin and mineral supplements as recommended by your health care provider. What happens during an annual well check? The services and screenings done by your health care provider during your annual well check will depend on your age, overall health, lifestyle risk factors, and family history of disease. Counseling  Your health care provider may ask you questions about your: Alcohol use. Tobacco use. Drug use. Emotional well-being. Home and relationship well-being. Sexual activity. Eating habits. History of falls. Memory and ability to understand (cognition). Work and work Astronomer. Reproductive health. Screening  You may have the following tests or measurements: Height, weight, and BMI. Blood pressure. Lipid and cholesterol levels. These may be checked every 5 years, or more frequently if you are over 1 years old. Skin check. Lung cancer screening. You may have this screening every year starting at age 32 if you have a 30-pack-year history of smoking and currently smoke or have quit within the past 15 years. Fecal occult blood test (FOBT) of the stool. You may have this test every year starting at age 36. Flexible sigmoidoscopy or colonoscopy. You may have a sigmoidoscopy every 5 years or a colonoscopy every 10 years starting at age 19. Hepatitis C blood test. Hepatitis B blood test. Sexually transmitted disease (STD) testing. Diabetes screening. This is done by checking your blood sugar (glucose)  after you have not eaten for a while (fasting). You may have this done every 1-3 years. Bone density scan. This is done to screen for osteoporosis. You may have this done starting at age 52. Mammogram. This may be done every 1-2 years. Talk to your health care provider about how often you should have regular mammograms. Talk with your health care provider about your test results, treatment options, and if necessary,  the need for more tests. Vaccines  Your health care provider may recommend certain vaccines, such as: Influenza vaccine. This is recommended every year. Tetanus, diphtheria, and acellular pertussis (Tdap, Td) vaccine. You may need a Td booster every 10 years. Zoster vaccine. You may need this after age 31. Pneumococcal 13-valent conjugate (PCV13) vaccine. One dose is recommended after age 19. Pneumococcal polysaccharide (PPSV23) vaccine. One dose is recommended after age 56. Talk to your health care provider about which screenings and vaccines you need and how often you need them. This information is not intended to replace advice given to you by your health care provider. Make sure you discuss any questions you have with your health care provider. Document Released: 09/24/2015 Document Revised: 05/17/2016 Document Reviewed: 06/29/2015 Elsevier Interactive Patient Education  2017 [employer] Prevention in the Home Falls can cause injuries. They can happen to people of all ages. There are many things you can do to make your home safe and to help prevent falls. What can I do on the outside of my home? Regularly fix the edges of walkways and driveways and fix any cracks. Remove anything that might make you trip as you walk through a door, such as a raised step or threshold. Trim any bushes or trees on the path to your home. Use bright outdoor lighting. Clear any walking paths of anything that might make someone trip, such as rocks or tools. Regularly check to see if handrails are loose or broken. Make sure that both sides of any steps have handrails. Any raised decks and porches should have guardrails on the edges. Have any leaves, snow, or ice cleared regularly. Use sand or salt on walking paths during winter. Clean up any spills in your garage right away. This includes oil or grease spills. What can I do in the bathroom? Use night lights. Install grab bars by the toilet and in the  tub and shower. Do not use towel bars as grab bars. Use non-skid mats or decals in the tub or shower. If you need to sit down in the shower, use a plastic, non-slip stool. Keep the floor dry. Clean up any water that spills on the floor as soon as it happens. Remove soap buildup in the tub or shower regularly. Attach bath mats securely with double-sided non-slip rug tape. Do not have throw rugs and other things on the floor that can make you trip. What can I do in the bedroom? Use night lights. Make sure that you have a light by your bed that is easy to reach. Do not use any sheets or blankets that are too big for your bed. They should not hang down onto the floor. Have a firm chair that has side arms. You can use this for support while you get dressed. Do not have throw rugs and other things on the floor that can make you trip. What can I do in the kitchen? Clean up any spills right away. Avoid walking on wet floors. Keep items that you use a lot in easy-to-reach places. If  you need to reach something above you, use a strong step stool that has a grab bar. Keep electrical cords out of the way. Do not use floor polish or wax that makes floors slippery. If you must use wax, use non-skid floor wax. Do not have throw rugs and other things on the floor that can make you trip. What can I do with my stairs? Do not leave any items on the stairs. Make sure that there are handrails on both sides of the stairs and use them. Fix handrails that are broken or loose. Make sure that handrails are as long as the stairways. Check any carpeting to make sure that it is firmly attached to the stairs. Fix any carpet that is loose or worn. Avoid having throw rugs at the top or bottom of the stairs. If you do have throw rugs, attach them to the floor with carpet tape. Make sure that you have a light switch at the top of the stairs and the bottom of the stairs. If you do not have them, ask someone to add them for  you. What else can I do to help prevent falls? Wear shoes that: Do not have high heels. Have rubber bottoms. Are comfortable and fit you well. Are closed at the toe. Do not wear sandals. If you use a stepladder: Make sure that it is fully opened. Do not climb a closed stepladder. Make sure that both sides of the stepladder are locked into place. Ask someone to hold it for you, if possible. Clearly mark and make sure that you can see: Any grab bars or handrails. First and last steps. Where the edge of each step is. Use tools that help you move around (mobility aids) if they are needed. These include: Canes. Walkers. Scooters. Crutches. Turn on the lights when you go into a dark area. Replace any light bulbs as soon as they burn out. Set up your furniture so you have a clear path. Avoid moving your furniture around. If any of your floors are uneven, fix them. If there are any pets around you, be aware of where they are. Review your medicines with your doctor. Some medicines can make you feel dizzy. This can increase your chance of falling. Ask your doctor what other things that you can do to help prevent falls. This information is not intended to replace advice given to you by your health care provider. Make sure you discuss any questions you have with your health care provider. Document Released: 06/24/2009 Document Revised: 02/03/2016 Document Reviewed: 10/02/2014 Elsevier Interactive Patient Education  2017 [employer].

## 2023-01-19 ENCOUNTER — Telehealth: Payer: Self-pay

## 2023-01-19 DIAGNOSIS — N1831 Chronic kidney disease, stage 3a: Secondary | ICD-10-CM

## 2023-01-19 DIAGNOSIS — Z79899 Other long term (current) drug therapy: Secondary | ICD-10-CM

## 2023-01-19 NOTE — Telephone Encounter (Signed)
Pt needs labs order next week before her appt on the 20th Call Darel Hong [phone] leave message

## 2023-01-21 NOTE — Telephone Encounter (Signed)
I recommend A1c, metabolic 7, urine ACR Diagnosis diabetes Also the message from the front states her appointment on the 20th make sure they are aware of the appointment is actually on the 21st at 8:20 AM thank you

## 2023-01-23 NOTE — Telephone Encounter (Signed)
Blood work ordered in [employer]. Patient notied

## 2023-01-24 DIAGNOSIS — E1122 Type 2 diabetes mellitus with diabetic chronic kidney disease: Secondary | ICD-10-CM | POA: Diagnosis not present

## 2023-01-24 DIAGNOSIS — N1831 Chronic kidney disease, stage 3a: Secondary | ICD-10-CM | POA: Diagnosis not present

## 2023-01-24 DIAGNOSIS — Z79899 Other long term (current) drug therapy: Secondary | ICD-10-CM | POA: Diagnosis not present

## 2023-01-25 LAB — CMP14+EGFR
ALT: 10 IU/L (ref 0–32)
AST: 12 IU/L (ref 0–40)
Albumin/Globulin Ratio: 1.8 (ref 1.2–2.2)
Albumin: 4.1 g/dL (ref 3.6–4.6)
Alkaline Phosphatase: 141 IU/L — ABNORMAL HIGH (ref 44–121)
BUN/Creatinine Ratio: 10 — ABNORMAL LOW (ref 12–28)
BUN: 10 mg/dL (ref 10–36)
Bilirubin Total: 0.9 mg/dL (ref 0.0–1.2)
CO2: 23 mmol/L (ref 20–29)
Calcium: 8.9 mg/dL (ref 8.7–10.3)
Chloride: 97 mmol/L (ref 96–106)
Creatinine, Ser: 1.01 mg/dL — ABNORMAL HIGH (ref 0.57–1.00)
Globulin, Total: 2.3 g/dL (ref 1.5–4.5)
Glucose: 159 mg/dL — ABNORMAL HIGH (ref 70–99)
Potassium: 3.9 mmol/L (ref 3.5–5.2)
Sodium: 136 mmol/L (ref 134–144)
Total Protein: 6.4 g/dL (ref 6.0–8.5)
eGFR: 52 mL/min/{1.73_m2} — ABNORMAL LOW (ref >59)

## 2023-01-25 LAB — CBC WITH DIFFERENTIAL/PLATELET
Basophils Absolute: 0 10*3/uL (ref 0.0–0.2)
Basos: 1 %
EOS (ABSOLUTE): 0.1 10*3/uL (ref 0.0–0.4)
Eos: 2 %
Hematocrit: 34.5 % (ref 34.0–46.6)
Hemoglobin: 11.3 g/dL (ref 11.1–15.9)
Immature Grans (Abs): 0 10*3/uL (ref 0.0–0.1)
Immature Granulocytes: 0 %
Lymphocytes Absolute: 1 10*3/uL (ref 0.7–3.1)
Lymphs: 15 %
MCH: 29.9 pg (ref 26.6–33.0)
MCHC: 32.8 g/dL (ref 31.5–35.7)
MCV: 91 fL (ref 79–97)
Monocytes Absolute: 0.6 10*3/uL (ref 0.1–0.9)
Monocytes: 9 %
Neutrophils Absolute: 4.7 10*3/uL (ref 1.4–7.0)
Neutrophils: 73 %
Platelets: 242 10*3/uL (ref 150–450)
RBC: 3.78 x10E6/uL (ref 3.77–5.28)
RDW: 12.9 % (ref 11.7–15.4)
WBC: 6.5 10*3/uL (ref 3.4–10.8)

## 2023-01-25 LAB — HEMOGLOBIN A1C
Est. average glucose Bld gHb Est-mCnc: 169 mg/dL
Hgb A1c MFr Bld: 7.5 % — ABNORMAL HIGH (ref 4.8–5.6)

## 2023-01-30 ENCOUNTER — Encounter: Payer: Self-pay | Admitting: Family Medicine

## 2023-01-30 ENCOUNTER — Ambulatory Visit (INDEPENDENT_AMBULATORY_CARE_PROVIDER_SITE_OTHER): Payer: PPO | Admitting: Family Medicine

## 2023-01-30 VITALS — BP 122/82 | HR 95 | Ht 64.0 in | Wt 147.6 lb

## 2023-01-30 DIAGNOSIS — I4891 Unspecified atrial fibrillation: Secondary | ICD-10-CM | POA: Diagnosis not present

## 2023-01-30 DIAGNOSIS — N63 Unspecified lump in unspecified breast: Secondary | ICD-10-CM | POA: Diagnosis not present

## 2023-01-30 DIAGNOSIS — E1169 Type 2 diabetes mellitus with other specified complication: Secondary | ICD-10-CM

## 2023-01-30 DIAGNOSIS — Z7984 Long term (current) use of oral hypoglycemic drugs: Secondary | ICD-10-CM | POA: Diagnosis not present

## 2023-01-30 DIAGNOSIS — E785 Hyperlipidemia, unspecified: Secondary | ICD-10-CM | POA: Diagnosis not present

## 2023-01-30 DIAGNOSIS — N1831 Chronic kidney disease, stage 3a: Secondary | ICD-10-CM

## 2023-01-30 DIAGNOSIS — Z79899 Other long term (current) drug therapy: Secondary | ICD-10-CM | POA: Diagnosis not present

## 2023-01-30 DIAGNOSIS — H548 Legal blindness, as defined in USA: Secondary | ICD-10-CM | POA: Diagnosis not present

## 2023-01-30 DIAGNOSIS — E1122 Type 2 diabetes mellitus with diabetic chronic kidney disease: Secondary | ICD-10-CM

## 2023-01-30 DIAGNOSIS — L989 Disorder of the skin and subcutaneous tissue, unspecified: Secondary | ICD-10-CM | POA: Diagnosis not present

## 2023-01-30 MED ORDER — CLOBETASOL PROPIONATE 0.05 % EX CREA: TOPICAL_CREAM | CUTANEOUS | 1 refills | Status: DC

## 2023-01-30 NOTE — Progress Notes (Signed)
Subjective:    Patient ID: Julie Swanson, female    DOB: 1927-09-13, 89 y.o.   MRN: [id number]  HPI Type 2 diabetes mellitus with stage 3a chronic kidney disease, without long-term current use of insulin (HCC) - Plan: Hemoglobin A1c, Basic Metabolic Panel  Hyperlipidemia associated with type 2 diabetes mellitus (HCC) - Plan: Lipid panel  Legal blindness  Atrial fibrillation, unspecified type (HCC)  Breast mass in female  Skin lesion of right leg  High risk medication use - Plan: Hepatic Function Panel Outpatient Encounter Medications as of 01/30/2023  Medication Sig   acetaminophen (TYLENOL) 500 MG tablet Take 500 mg by mouth 2 (two) times daily as needed for moderate pain.   atorvastatin (LIPITOR) 10 MG tablet TAKE (1) TABLET BY MOUTH ONCE DAILY.   blood glucose meter kit and supplies Dispense based on patient and insurance preference. Use to check sugars once daily Dx E11.9   clobetasol cream (TEMOVATE) 0.05 % May apply thin amount bid prn bug bite   cycloSPORINE (RESTASIS) 0.05 % ophthalmic emulsion Place 1 drop into both eyes 2 (two) times daily.   diltiazem (CARDIZEM CD) 240 MG 24 hr capsule TAKE (1) CAPSULE BY MOUTH ONCE DAILY.   ELIQUIS 5 MG TABS tablet TAKE (1) TABLET BY MOUTH TWICE DAILY.   furosemide (LASIX) 40 MG tablet TAKE 1 AND 1/2 TABLETS BY MOUTH EVERY MORNING AND 1 AND 1/2 TABLET EVERY EVENING.   glucose blood (ONETOUCH ULTRA) test strip USE TO TEST BLOOD SUGAR ONCE A DAY   Lancets (ONETOUCH DELICA PLUS LANCET33G) MISC USE TO TEST BLOOD SUGAR ONCE A DAY   linagliptin (TRADJENTA) 5 MG TABS tablet Take 1 tablet (5 mg total) by mouth daily.   Lutein 20 MG CAPS Take 1 capsule by mouth daily.   metoprolol succinate (TOPROL-XL) 50 MG 24 hr tablet TAKE (1) TABLET BY MOUTH TWICE DAILY.   OVER THE COUNTER MEDICATION Med Name: BENE-FIBER Take two (2) teaspoons by mouth daily with 8 oz water.   potassium chloride SA (KLOR-CON M) 20 MEQ tablet TAKE (1) TABLET BY MOUTH ONCE  DAILY.   Probiotic Product (PROBIOTIC ADVANCED PO) Take by mouth. Once per day   mometasone (ELOCON) 0.1 % cream Apply BID prn to rash   neomycin-polymyxin b-dexamethasone (MAXITROL) 3.5-10000-0.1 OINT    No facility-administered encounter medications on file as of 01/30/2023.   Results for orders placed or performed in visit on 01/19/23  CMP14+EGFR  Result Value Ref Range   Glucose 159 (H) 70 - 99 mg/dL   BUN 10 10 - 36 mg/dL   Creatinine, Ser 4.09 (H) 0.57 - 1.00 mg/dL   eGFR 52 (L) >81 XB/JYN/8.29   BUN/Creatinine Ratio 10 (L) 12 - 28   Sodium 136 134 - 144 mmol/L   Potassium 3.9 3.5 - 5.2 mmol/L   Chloride 97 96 - 106 mmol/L   CO2 23 20 - 29 mmol/L   Calcium 8.9 8.7 - 10.3 mg/dL   Total Protein 6.4 6.0 - 8.5 g/dL   Albumin 4.1 3.6 - 4.6 g/dL   Globulin, Total 2.3 1.5 - 4.5 g/dL   Albumin/Globulin Ratio 1.8 1.2 - 2.2   Bilirubin Total 0.9 0.0 - 1.2 mg/dL   Alkaline Phosphatase 141 (H) 44 - 121 IU/L   AST 12 0 - 40 IU/L   ALT 10 0 - 32 IU/L  Hemoglobin A1c  Result Value Ref Range   Hgb A1c MFr Bld 7.5 (H) 4.8 - 5.6 %   Est. average  glucose Bld gHb Est-mCnc 169 mg/dL  CBC with Differential/Platelet  Result Value Ref Range   WBC 6.5 3.4 - 10.8 x10E3/uL   RBC 3.78 3.77 - 5.28 x10E6/uL   Hemoglobin 11.3 11.1 - 15.9 g/dL   Hematocrit 40.9 81.1 - 46.6 %   MCV 91 79 - 97 fL   MCH 29.9 26.6 - 33.0 pg   MCHC 32.8 31.5 - 35.7 g/dL   RDW 91.4 78.2 - 95.6 %   Platelets 242 150 - 450 x10E3/uL   Neutrophils 73 Not Estab. %   Lymphs 15 Not Estab. %   Monocytes 9 Not Estab. %   Eos 2 Not Estab. %   Basos 1 Not Estab. %   Neutrophils Absolute 4.7 1.4 - 7.0 x10E3/uL   Lymphocytes Absolute 1.0 0.7 - 3.1 x10E3/uL   Monocytes Absolute 0.6 0.1 - 0.9 x10E3/uL   EOS (ABSOLUTE) 0.1 0.0 - 0.4 x10E3/uL   Basophils Absolute 0.0 0.0 - 0.2 x10E3/uL   Immature Granulocytes 0 Not Estab. %   Immature Grans (Abs) 0.0 0.0 - 0.1 x10E3/uL      Review of Systems     Objective:   Physical  Exam General-in no acute distress Eyes-no discharge Lungs-respiratory rate normal, CTA CV-no murmurs,RRR Extremities skin warm dry no edema Neuro grossly normal Behavior normal, alert  Left breast mass 1 and 1/2 inch by 2 and 1/2 inch none ulcerated firm lesion consistent with a breast cancer      Assessment & Plan:   1. Type 2 diabetes mellitus with stage 3a chronic kidney disease, without long-term current use of insulin (HCC) Diabetes Doing well with Tradjenta Continue current medication Patient prefers to avoid aggressive care  2. Hyperlipidemia associated with type 2 diabetes mellitus (HCC) Doing well with cholesterol medicine Healthy diet   3. Legal blindness She has macular degeneration.  Family helps her out she is able to meet most of her needs at home  4. Atrial fibrillation, unspecified type (HCC) On her medication heart rate under good control  5. Breast mass in female Approximately 3 cm x 2 and half centimeter mass in the left breast patient does not want to do any additional mammograms does not want biopsy as long as this does not become ulcerated we are okay with that approach she is at the palliative phase of life to where she would prefer no aggressive interventions axilla no nodules felt lungs sound good  6. Skin lesion of right leg Crusted skin lesion on the right side approximately 5 mm patient defers on dermatology consult  Follow-up in approximately 3 and 4:30 months

## 2023-02-12 ENCOUNTER — Other Ambulatory Visit: Payer: Self-pay | Admitting: Family Medicine

## 2023-02-12 ENCOUNTER — Other Ambulatory Visit (HOSPITAL_COMMUNITY): Payer: Self-pay | Admitting: Cardiology

## 2023-03-13 ENCOUNTER — Telehealth: Payer: Self-pay | Admitting: Family Medicine

## 2023-03-13 NOTE — Telephone Encounter (Signed)
Nurses Patient recently was seen and was started on Tradjenta She has a follow-up visit in October If she has a glucometer at home have her check her sugars periodically and send them to [place] if she does not have a glucometer I would not worry about it and instead just try to eat healthy avoiding sugary foods

## 2023-03-14 NOTE — Telephone Encounter (Signed)
Callded Pt daughter Darel Hong) No answer left Vm for callback

## 2023-03-14 NOTE — Telephone Encounter (Signed)
Spoke with daughter regarding message , patient is not needing any refills at this time, made aware of lab orders

## 2023-04-05 ENCOUNTER — Encounter (HOSPITAL_COMMUNITY): Payer: Self-pay | Admitting: Cardiology

## 2023-04-05 ENCOUNTER — Ambulatory Visit (HOSPITAL_COMMUNITY)
Admission: RE | Admit: 2023-04-05 | Discharge: 2023-04-05 | Disposition: A | Discharge status: Home / Self Care | Payer: PPO | Source: Ambulatory Visit | Attending: Cardiology | Admitting: Cardiology

## 2023-04-05 VITALS — BP 120/70 | HR 86 | Wt 147.6 lb

## 2023-04-05 DIAGNOSIS — Z8249 Family history of ischemic heart disease and other diseases of the circulatory system: Secondary | ICD-10-CM | POA: Insufficient documentation

## 2023-04-05 DIAGNOSIS — I5032 Chronic diastolic (congestive) heart failure: Secondary | ICD-10-CM | POA: Diagnosis not present

## 2023-04-05 DIAGNOSIS — I480 Paroxysmal atrial fibrillation: Secondary | ICD-10-CM | POA: Diagnosis not present

## 2023-04-05 DIAGNOSIS — I110 Hypertensive heart disease with heart failure: Secondary | ICD-10-CM | POA: Diagnosis not present

## 2023-04-05 DIAGNOSIS — Z79899 Other long term (current) drug therapy: Secondary | ICD-10-CM | POA: Insufficient documentation

## 2023-04-05 DIAGNOSIS — Z7901 Long term (current) use of anticoagulants: Secondary | ICD-10-CM | POA: Diagnosis not present

## 2023-04-05 DIAGNOSIS — E785 Hyperlipidemia, unspecified: Secondary | ICD-10-CM | POA: Insufficient documentation

## 2023-04-05 LAB — CBC
HCT: 36.8 % (ref 36.0–46.0)
Hemoglobin: 12 g/dL (ref 12.0–15.0)
MCH: 28.7 pg (ref 26.0–34.0)
MCHC: 32.6 g/dL (ref 30.0–36.0)
MCV: 88 fL (ref 80.0–100.0)
Platelets: 220 10*3/uL (ref 150–400)
RBC: 4.18 MIL/uL (ref 3.87–5.11)
RDW: 14.1 % (ref 11.5–15.5)
WBC: 5.7 10*3/uL (ref 4.0–10.5)
nRBC: 0 % (ref 0.0–0.2)

## 2023-04-05 LAB — BASIC METABOLIC PANEL
Anion gap: 10 (ref 5–15)
BUN: 9 mg/dL (ref 8–23)
CO2: 27 mmol/L (ref 22–32)
Calcium: 8.9 mg/dL (ref 8.9–10.3)
Chloride: 100 mmol/L (ref 98–111)
Creatinine, Ser: 0.89 mg/dL (ref 0.44–1.00)
GFR, Estimated: >60 mL/min (ref >60)
Glucose, Bld: 149 mg/dL — ABNORMAL HIGH (ref 70–99)
Potassium: 3.6 mmol/L (ref 3.5–5.1)
Sodium: 137 mmol/L (ref 135–145)

## 2023-04-05 LAB — BRAIN NATRIURETIC PEPTIDE: B Natriuretic Peptide: 488.2 pg/mL — ABNORMAL HIGH (ref 0.0–100.0)

## 2023-04-05 NOTE — Progress Notes (Signed)
Patient ID: Julie Swanson, female   DOB: 01-12-1928, 89 y.o.   MRN: [id number] PCP: Dr. Lilyan Punt Cardiology: Dr. Shirlee Latch  89 y.o. presents for followup of paroxysmal atrial fibrillation and diastolic CHF.  She had been having runs of rapid tachypalpitations when I saw her initially.  She was quite symptomatic with episodes.  Event monitor showed runs of atrial fibrillation with RVR.  I started her on Multaq for rhythm control given her significant symptoms and on apixaban.  She did not tolerate diltiazem due to ankle swelling and was switched over to Coreg for rate control.  She did not tolerate Coreg and is back on propranolol, which she does ok with.  Echo in 11/14 showed EF 60-65% with mild MR. No exertional chest pain or dyspnea. No lightheadedness.  She went into atrial fibrillation again in 8/16.  She had DCCV in 9/16 back to NSR and was continued on Multaq.  However, she went back into atrial fibrillation shortly after on despite Multaq use.  Multaq was stopped.  Given ongoing fatigue with atrial fibrillation, she was started on amiodarone.  DCCV was done in 12/16, she went back into NSR.  Propranolol was stopped due to bradycardia.  Unfortunately, atrial fibrillation has recurred.  She saw Tereso Newcomer and it was decided to try to reload her on amiodarone and attempt DCCV one more time.  Later in 12/16, I cardioverted her again while on amiodarone.  However, at 09/10/15 appt in atrial fibrillation clinic, she was back in atrial fibrillation.  She developed a tremor on amiodarone.  Amiodarone was stopped with plan to rate control/anticoagulate going forward.  She is now on Toprol XL and diltiazem CD.  Echo in 2/19 showed EF 60-65%, mild LVH, moderate TR, PASP 37 mmHg.   She had an abnormal mammogram but has not wanted to have a breast biopsy.   She remains in atrial fibrillation, HR controlled. Weight stable.  Macular degeneration has been progressing and eyesight is worse.  She tires easily.   However, no dyspnea walking on flat ground generally as long as she walks slow.  She will walk on a treadmill for 5-10 minutes at a time for exercise.  No chest pain.  No lightheadedness or falls. She does not feel palpitations.   ECG (personally reviewed): atrial fibrillation, septal Qs  Labs (10/14): LDL 90, HDL 41, K 4.1, creatinine 0.9 Labs (4/15): LDL 74, HDL 40, K 4.1, creatinine 7.84 Labs (10/15): LDL 73, HDL 38 Labs (12/15): K 4, creatinine 0.9, HCT 36 Labs (8/16): K 4, creatinine 0.97, HCT 37.8, TSH normal Labs (9/16): K 4.6, creatinine 1.06, LDL 68, HDL 47 Labs (11/16): TSH normal, HCT 34.1, LFTs normal Labs (12/16): creatinine 0.96, HCT 35.9 Labs (5/17): K 4.1, creatinine 0.99, LDL 77, HDL 38 Labs (2/18): K 4, creatinine 1.05 Labs (4/18): BNP 321 Labs (5/18): K 3.9, creatinine 1.06, LDL 62 Labs (9/18): K 3.9, creatinine 1.04 Labs (2/19): hgb 12.9, K 3.9, creatinine 0.95 Labs (4/19): LDL 69, HDL 38 Labs (8/19): hgb 12.6, creatinine 0.98 Labs (9/19): K 3.8, creatinine 1.06, hgb 12.8 Labs (2/20): LDL 68, hgb 12.5 Labs (7/20): K 3.7, creatinine 0.97 Labs (10/20): K 3.9, creatinine 0.92, hgb 12.6 Labs (12/21): LDL 81, HDL 32, K 3.9, creatinine 6.96 Labs (9/22): K 4.3, creatinine 1.05, LDL 68 Labs (6/23): K 3.9, creatinine 1.09, LDL 66 Labs (1/24): LDL 65 Labs (5/24): K 3.9, creatinine 1.01  PMH: 1. LHC (8/10) with minimal nonobstructive disease.  ETT (10/13) with 2'53" exercise,  nonspecific ECG changes with no evidence for ischemia.  2. Mitral valve prolapse: Echo (11/14) with EF 60-65%, mild MR.  3. Atrial fibrillation:  Event monitor (10/13) with PACs only.  Event monitor (11/14) showed atrial fibrillation with RVR. She does not tolerate metoprolol (nightmares).  She had ankle swelling with diltiazem. Possible fatigue/diarrhea with Coreg. Holter (8/16) with persistent atrial fibrillation.  DCCV to NSR in 9/16.  Back in atrial fibrillation by 10/16 despite Multaq. DCCV to NSR  x 2 in 12/16 on amiodarone, but atrial fibrillation recurred.  Amiodarone stopped. 4. HTN 5. Hyperlipidemia 6. Hysterectomy. 7. Carotid disease: Carotid dopplers (1/15) with prominent plaque, mild stenosis.  - Carotid dopplers (2/20): mild BICA stenosis.  8. Internal hemorrhoids with episodic rectal bleeding.  9. Macular degeneration.  10. PFTs: Mild obstructive lung disease.  11. PAD: ABIs (2/17) were normal, but there was evidence for "developing fem-pop disease."  ABIs normal in 2/18.  12. Pancreatic cysts on CT.  13. Chronic diastolic CHF - Echo (2/19): EF 60-65%, mild LVH, moderate TR, PASP 37 mmHg.  14. H/o Lyme disease 15. Left breast mass: has decided not to work up.   SH: Widow, lives in [place], nonsmoker.  2 daughters.   FH: Mother with CVA, CAD.  Father with CAD.   ROS: All systems reviewed and negative except as per HPI.   Current Outpatient Medications  Medication Sig Dispense Refill   acetaminophen (TYLENOL) 500 MG tablet Take 500 mg by mouth 2 (two) times daily as needed for moderate pain.     atorvastatin (LIPITOR) 10 MG tablet TAKE (1) TABLET BY MOUTH ONCE DAILY. 90 tablet 0   blood glucose meter kit and supplies Dispense based on patient and insurance preference. Use to check sugars once daily Dx E11.9 1 each 0   clobetasol cream (TEMOVATE) 0.05 % May apply thin amount bid prn bug bite 15 g 1   cycloSPORINE (RESTASIS) 0.05 % ophthalmic emulsion Place 1 drop into both eyes 2 (two) times daily.     diltiazem (CARDIZEM CD) 240 MG 24 hr capsule TAKE (1) CAPSULE BY MOUTH ONCE DAILY. 90 capsule 0   ELIQUIS 5 MG TABS tablet TAKE (1) TABLET BY MOUTH TWICE DAILY. 180 tablet 0   furosemide (LASIX) 40 MG tablet TAKE 1 AND 1/2 TABLETS BY MOUTH EVERY MORNING AND 1 AND 1/2 TABLET EVERY EVENING. 270 tablet 0   glucose blood (ONETOUCH ULTRA) test strip USE TO TEST BLOOD SUGAR ONCE A DAY 100 strip 2   Lancets (ONETOUCH DELICA PLUS LANCET33G) MISC USE TO TEST BLOOD SUGAR ONCE A  DAY 100 each 1   Lutein 20 MG CAPS Take 1 capsule by mouth daily.     metoprolol succinate (TOPROL-XL) 50 MG 24 hr tablet TAKE (1) TABLET BY MOUTH TWICE DAILY. 180 tablet 0   mometasone (ELOCON) 0.1 % cream Apply BID prn to rash 45 g 0   neomycin-polymyxin b-dexamethasone (MAXITROL) 3.5-10000-0.1 OINT      OVER THE COUNTER MEDICATION Med Name: BENE-FIBER Take two (2) teaspoons by mouth daily with 8 oz water.     potassium chloride SA (KLOR-CON M) 20 MEQ tablet TAKE (1) TABLET BY MOUTH ONCE DAILY. 90 tablet 0   Probiotic Product (PROBIOTIC ADVANCED PO) Take by mouth. Once per day     TRADJENTA 5 MG TABS tablet TAKE (1) TABLET BY MOUTH ONCE DAILY. 90 tablet 0   No current facility-administered medications for this encounter.    BP 120/70   Pulse 86  Wt 67 kg (147 lb 9.6 oz)   SpO2 97%   BMI 25.34 kg/m  General: NAD Neck: No JVD, no thyromegaly or thyroid nodule.  Lungs: Clear to auscultation bilaterally with normal respiratory effort. CV: Nondisplaced PMI.  Heart irregular S1/S2, no S3/S4, no murmur.  1+ ankle edema.  No carotid bruit.  Normal pedal pulses.  Abdomen: Soft, nontender, no hepatosplenomegaly, no distention.  Skin: Intact without lesions or rashes.  Neurologic: Alert and oriented x 3.  Psych: Normal affect. Extremities: No clubbing or cyanosis.  HEENT: Normal.   Assessment/Plan: 1. Atrial fibrillation:  Chronic, has recurred after DCCV in 12/16 on amiodarone.  She has failed amiodarone and Multaq.  She is not a good sotalol or Tikosyn candidate as QTc has been prolonged.  We have decided on following a rate control and anticoagulation strategy. HR is controlled. - Continue Toprol XL 50 mg bid and diltiazem CD 240 mg daily.    - Continue Eliquis, CBC today.  2. HTN: BP controlled.  3. Hyperlipidemia: Good lipids 1/24  4. Chronic diastolic CHF:  Chronic NYHA class II-III symptoms.  She is not volume overloaded on exam today. She has some peripheral edema, but think this  is due to venous insufficiency.  - Continue Lasix 60 mg bid, BMET/BNP today.     - She needs to wear compression stockings during the day.  - She did not tolerate Jardiance.   Followup in 6 months with APP.   Marca Ancona 04/05/2023

## 2023-04-05 NOTE — Patient Instructions (Signed)
Medication Changes:  No Changes In Medications at this time.   Lab Work:  Labs done today, your results will be available in MyChart, we will contact you for abnormal readings.  Follow-Up in: 6 MONTHS WITH DR. Shirlee Latch PLEASE CALL OUR OFFICE AROUND MID NOVEMBER TO GET SCHEDULED FOR YOUR APPOINTMENT. PHONE NUMBER IS [phone] OPTION 2   At [facility], you and your health needs are our priority. We have a designated team specialized in the treatment of Heart Failure. This Care Team includes your primary Heart Failure Specialized Cardiologist (physician), Advanced Practice Providers (APPs- Physician Assistants and Nurse Practitioners), and Pharmacist who all work together to provide you with the care you need, when you need it.   You may see any of the following providers on your designated Care Team at your next follow up:  Dr. Arvilla Meres Dr. Marca Ancona Dr. Marcos Eke, NP Robbie Lis, [place] [facility] [place], [place] Brynda Peon, NP Karle Plumber, PharmD   Please be sure to bring in all your medications bottles to every appointment.   Need to Contact us:  If you have any questions or concerns before your next appointment please send [place] a message through [place] or call our office at [phone].    TO LEAVE A MESSAGE FOR THE NURSE SELECT OPTION 2, PLEASE LEAVE A MESSAGE INCLUDING: YOUR NAME DATE OF BIRTH CALL BACK NUMBER REASON FOR CALL**this is important as we prioritize the call backs  YOU WILL RECEIVE A CALL BACK THE SAME DAY AS LONG AS YOU CALL BEFORE 4:00 PM

## 2023-05-01 ENCOUNTER — Other Ambulatory Visit: Payer: Self-pay | Admitting: Family Medicine

## 2023-05-01 ENCOUNTER — Other Ambulatory Visit (HOSPITAL_COMMUNITY): Payer: Self-pay | Admitting: Cardiology

## 2023-06-11 DIAGNOSIS — Z79899 Other long term (current) drug therapy: Secondary | ICD-10-CM | POA: Diagnosis not present

## 2023-06-11 DIAGNOSIS — E1122 Type 2 diabetes mellitus with diabetic chronic kidney disease: Secondary | ICD-10-CM | POA: Diagnosis not present

## 2023-06-11 DIAGNOSIS — N1831 Chronic kidney disease, stage 3a: Secondary | ICD-10-CM | POA: Diagnosis not present

## 2023-06-11 DIAGNOSIS — E1169 Type 2 diabetes mellitus with other specified complication: Secondary | ICD-10-CM | POA: Diagnosis not present

## 2023-06-11 DIAGNOSIS — E785 Hyperlipidemia, unspecified: Secondary | ICD-10-CM | POA: Diagnosis not present

## 2023-06-12 LAB — HEMOGLOBIN A1C
Est. average glucose Bld gHb Est-mCnc: 189 mg/dL
Hgb A1c MFr Bld: 8.2 % — ABNORMAL HIGH (ref 4.8–5.6)

## 2023-06-12 LAB — BASIC METABOLIC PANEL
BUN/Creatinine Ratio: 11 — ABNORMAL LOW (ref 12–28)
BUN: 10 mg/dL (ref 10–36)
CO2: 23 mmol/L (ref 20–29)
Calcium: 8.9 mg/dL (ref 8.7–10.3)
Chloride: 98 mmol/L (ref 96–106)
Creatinine, Ser: 0.95 mg/dL (ref 0.57–1.00)
Glucose: 151 mg/dL — ABNORMAL HIGH (ref 70–99)
Potassium: 3.9 mmol/L (ref 3.5–5.2)
Sodium: 136 mmol/L (ref 134–144)
eGFR: 56 mL/min/{1.73_m2} — ABNORMAL LOW (ref >59)

## 2023-06-12 LAB — LIPID PANEL
Chol/HDL Ratio: 3.9 {ratio} (ref 0.0–4.4)
Cholesterol, Total: 117 mg/dL (ref 100–199)
HDL: 30 mg/dL — ABNORMAL LOW (ref >39)
LDL Chol Calc (NIH): 62 mg/dL (ref 0–99)
Triglycerides: 141 mg/dL (ref 0–149)
VLDL Cholesterol Cal: 25 mg/dL (ref 5–40)

## 2023-06-12 LAB — HEPATIC FUNCTION PANEL
ALT: 8 [IU]/L (ref 0–32)
AST: 15 [IU]/L (ref 0–40)
Albumin: 4.1 g/dL (ref 3.6–4.6)
Alkaline Phosphatase: 133 [IU]/L — ABNORMAL HIGH (ref 44–121)
Bilirubin Total: 1 mg/dL (ref 0.0–1.2)
Bilirubin, Direct: 0.26 mg/dL (ref 0.00–0.40)
Total Protein: 6.2 g/dL (ref 6.0–8.5)

## 2023-06-14 ENCOUNTER — Encounter: Payer: Self-pay | Admitting: Family Medicine

## 2023-06-14 NOTE — Progress Notes (Signed)
Please mail to patient

## 2023-06-20 ENCOUNTER — Ambulatory Visit: Payer: PPO | Admitting: Family Medicine

## 2023-06-29 ENCOUNTER — Ambulatory Visit (INDEPENDENT_AMBULATORY_CARE_PROVIDER_SITE_OTHER): Payer: PPO | Admitting: Family Medicine

## 2023-06-29 VITALS — BP 115/69 | HR 83 | Wt 146.4 lb

## 2023-06-29 DIAGNOSIS — N1831 Chronic kidney disease, stage 3a: Secondary | ICD-10-CM

## 2023-06-29 DIAGNOSIS — E1122 Type 2 diabetes mellitus with diabetic chronic kidney disease: Secondary | ICD-10-CM | POA: Diagnosis not present

## 2023-06-29 DIAGNOSIS — E1169 Type 2 diabetes mellitus with other specified complication: Secondary | ICD-10-CM

## 2023-06-29 DIAGNOSIS — I4891 Unspecified atrial fibrillation: Secondary | ICD-10-CM | POA: Diagnosis not present

## 2023-06-29 DIAGNOSIS — N63 Unspecified lump in unspecified breast: Secondary | ICD-10-CM

## 2023-06-29 DIAGNOSIS — Z23 Encounter for immunization: Secondary | ICD-10-CM

## 2023-06-29 DIAGNOSIS — E785 Hyperlipidemia, unspecified: Secondary | ICD-10-CM | POA: Diagnosis not present

## 2023-06-30 NOTE — Progress Notes (Signed)
Subjective:    Patient ID: Julie Swanson, female    DOB: 1928/02/12, 89 y.o.   MRN: [id number]  HPI We had a good discussion today she has macular degeneration She also has a breast mass that she does not want biopsy She also has diabetes no low sugars We reviewed over her medicines She denies any chest tightness pressure pain shortness of breath She will be due for blood work before her next visit She takes her cholesterol medicine regular basis without trouble She does get fatigued a lot but has heart failure and on diltiazem to keep her heart rate running fast She is also on metoprolol She denies passing out with standing She she is not able to be as active as she used to be because of macular degeneration    Review of Systems     Objective:   Physical Exam General-in no acute distress Eyes-no discharge Lungs-respiratory rate normal, CTA CV-no murmurs,RRR Extremities skin warm dry no edema Neuro grossly normal Behavior normal, alert  Blood pressure does drop some with standing but not severe I was able to get 120/64 sitting and approximately 108/64 standing Her breast mass is more than likely cancer but there is no lymph nodes under the arm and she does not want to have any workup of this we will monitor that is approximately 1-1/2 inches in diameter freely movable     Assessment & Plan:  She does have a follow-up visit with cardiology coming up we will send them a message regarding her drop in blood pressure the patient denies any symptoms with it for now maintain medicine as is  Diabetes continue current measures avoid low sugars check A1c next visit Also check kidney function because of medication before next visit Patient suffers with blindness due to macular degeneration  Probable breast cancer see discussion above

## 2023-07-04 ENCOUNTER — Ambulatory Visit (HOSPITAL_COMMUNITY)
Admission: RE | Admit: 2023-07-04 | Discharge: 2023-07-04 | Disposition: A | Discharge status: Home / Self Care | Payer: PPO | Source: Ambulatory Visit | Attending: Cardiology | Admitting: Cardiology

## 2023-07-04 ENCOUNTER — Encounter (HOSPITAL_COMMUNITY): Payer: Self-pay | Admitting: Cardiology

## 2023-07-04 ENCOUNTER — Other Ambulatory Visit (HOSPITAL_COMMUNITY): Payer: Self-pay

## 2023-07-04 VITALS — Wt 145.0 lb

## 2023-07-04 DIAGNOSIS — Z7901 Long term (current) use of anticoagulants: Secondary | ICD-10-CM | POA: Diagnosis not present

## 2023-07-04 DIAGNOSIS — Z79899 Other long term (current) drug therapy: Secondary | ICD-10-CM | POA: Insufficient documentation

## 2023-07-04 DIAGNOSIS — I5032 Chronic diastolic (congestive) heart failure: Secondary | ICD-10-CM

## 2023-07-04 DIAGNOSIS — R0602 Shortness of breath: Secondary | ICD-10-CM | POA: Insufficient documentation

## 2023-07-04 DIAGNOSIS — I480 Paroxysmal atrial fibrillation: Secondary | ICD-10-CM | POA: Diagnosis not present

## 2023-07-04 DIAGNOSIS — R5383 Other fatigue: Secondary | ICD-10-CM | POA: Diagnosis not present

## 2023-07-04 DIAGNOSIS — I110 Hypertensive heart disease with heart failure: Secondary | ICD-10-CM | POA: Diagnosis not present

## 2023-07-04 DIAGNOSIS — E785 Hyperlipidemia, unspecified: Secondary | ICD-10-CM | POA: Insufficient documentation

## 2023-07-04 DIAGNOSIS — I4819 Other persistent atrial fibrillation: Secondary | ICD-10-CM | POA: Diagnosis not present

## 2023-07-04 LAB — BRAIN NATRIURETIC PEPTIDE: B Natriuretic Peptide: 397.2 pg/mL — ABNORMAL HIGH (ref 0.0–100.0)

## 2023-07-04 LAB — BASIC METABOLIC PANEL
Anion gap: 11 (ref 5–15)
BUN: 10 mg/dL (ref 8–23)
CO2: 25 mmol/L (ref 22–32)
Calcium: 9.1 mg/dL (ref 8.9–10.3)
Chloride: 101 mmol/L (ref 98–111)
Creatinine, Ser: 0.93 mg/dL (ref 0.44–1.00)
GFR, Estimated: 57 mL/min — ABNORMAL LOW (ref >60)
Glucose, Bld: 167 mg/dL — ABNORMAL HIGH (ref 70–99)
Potassium: 3.8 mmol/L (ref 3.5–5.1)
Sodium: 137 mmol/L (ref 135–145)

## 2023-07-04 MED ORDER — FUROSEMIDE 40 MG PO TABS: 80.0000 mg | ORAL_TABLET | Freq: Two times a day (BID) | ORAL | 3 refills | Status: DC

## 2023-07-04 MED ORDER — DILTIAZEM HCL ER COATED BEADS 120 MG PO CP24: 120.0000 mg | ORAL_CAPSULE | Freq: Every day | ORAL | 3 refills | Status: DC

## 2023-07-04 MED ORDER — POTASSIUM CHLORIDE CRYS ER 20 MEQ PO TBCR: 40.0000 meq | EXTENDED_RELEASE_TABLET | Freq: Every day | ORAL | 3 refills | Status: DC

## 2023-07-04 NOTE — Progress Notes (Signed)
Patient ID: Julie Swanson, female   DOB: [birth year], 89 y.o.   MRN: [id number] PCP: Dr. Lilyan Punt Cardiology: Dr. Shirlee Latch  89 y.o. presents for followup of paroxysmal atrial fibrillation and diastolic CHF.  She had been having runs of rapid tachypalpitations when I saw her initially.  She was quite symptomatic with episodes.  Event monitor showed runs of atrial fibrillation with RVR.  I started her on Multaq for rhythm control given her significant symptoms and on apixaban.  She did not tolerate diltiazem due to ankle swelling and was switched over to Coreg for rate control.  She did not tolerate Coreg and is back on propranolol, which she does ok with.  Echo in 11/14 showed EF 60-65% with mild MR. No exertional chest pain or dyspnea. No lightheadedness.  She went into atrial fibrillation again in 8/16.  She had DCCV in 9/16 back to NSR and was continued on Multaq.  However, she went back into atrial fibrillation shortly after on despite Multaq use.  Multaq was stopped.  Given ongoing fatigue with atrial fibrillation, she was started on amiodarone.  DCCV was done in 12/16, she went back into NSR.  Propranolol was stopped due to bradycardia.  Unfortunately, atrial fibrillation has recurred.  She saw Tereso Newcomer and it was decided to try to reload her on amiodarone and attempt DCCV one more time.  Later in 12/16, I cardioverted her again while on amiodarone.  However, at 09/10/15 appt in atrial fibrillation clinic, she was back in atrial fibrillation.  She developed a tremor on amiodarone.  Amiodarone was stopped with plan to rate control/anticoagulate going forward.  She is now on Toprol XL and diltiazem CD.  Echo in 2/19 showed EF 60-65%, mild LVH, moderate TR, PASP 37 mmHg.   She had an abnormal mammogram but has not wanted to have a breast biopsy.   She remains in atrial fibrillation, HR controlled. Weight stable. She reports worsened fatigue and poor energy today.  She is short of breath after walking  just a few minutes on her treadmill.  This is worse than in the past.  Her PCP noted orthostatic hypotension in the office.  SBP drops 10 mmHg today with standing. No orthopnea/PND.  She does not feel lightheaded.   ECG (personally reviewed): atrial fibrillation, septal Qs, LVH with repolarization abnormality  Labs (10/14): LDL 90, HDL 41, K 4.1, creatinine 0.9 Labs (4/15): LDL 74, HDL 40, K 4.1, creatinine 9.56 Labs (10/15): LDL 73, HDL 38 Labs (12/15): K 4, creatinine 0.9, HCT 36 Labs (8/16): K 4, creatinine 0.97, HCT 37.8, TSH normal Labs (9/16): K 4.6, creatinine 1.06, LDL 68, HDL 47 Labs (11/16): TSH normal, HCT 34.1, LFTs normal Labs (12/16): creatinine 0.96, HCT 35.9 Labs (5/17): K 4.1, creatinine 0.99, LDL 77, HDL 38 Labs (2/18): K 4, creatinine 1.05 Labs (4/18): BNP 321 Labs (5/18): K 3.9, creatinine 1.06, LDL 62 Labs (9/18): K 3.9, creatinine 1.04 Labs (2/19): hgb 12.9, K 3.9, creatinine 0.95 Labs (4/19): LDL 69, HDL 38 Labs (8/19): hgb 12.6, creatinine 0.98 Labs (9/19): K 3.8, creatinine 1.06, hgb 12.8 Labs (2/20): LDL 68, hgb 12.5 Labs (7/20): K 3.7, creatinine 0.97 Labs (10/20): K 3.9, creatinine 0.92, hgb 12.6 Labs (12/21): LDL 81, HDL 32, K 3.9, creatinine 2.13 Labs (9/22): K 4.3, creatinine 1.05, LDL 68 Labs (6/23): K 3.9, creatinine 1.09, LDL 66 Labs (1/24): LDL 65 Labs (5/24): K 3.9, creatinine 1.01 Labs (9/24): LDL 62, K 3.9, creatinine 0.86  PMH: 1. LHC (8/10)  with minimal nonobstructive disease.  ETT (10/13) with 2'53" exercise, nonspecific ECG changes with no evidence for ischemia.  2. Mitral valve prolapse: Echo (11/14) with EF 60-65%, mild MR.  3. Atrial fibrillation:  Event monitor (10/13) with PACs only.  Event monitor (11/14) showed atrial fibrillation with RVR. She does not tolerate metoprolol (nightmares).  She had ankle swelling with diltiazem. Possible fatigue/diarrhea with Coreg. Holter (8/16) with persistent atrial fibrillation.  DCCV to NSR in 9/16.   Back in atrial fibrillation by 10/16 despite Multaq. DCCV to NSR x 2 in 12/16 on amiodarone, but atrial fibrillation recurred.  Amiodarone stopped. 4. HTN 5. Hyperlipidemia 6. Hysterectomy. 7. Carotid disease: Carotid dopplers (1/15) with prominent plaque, mild stenosis.  - Carotid dopplers (2/20): mild BICA stenosis.  8. Internal hemorrhoids with episodic rectal bleeding.  9. Macular degeneration.  10. PFTs: Mild obstructive lung disease.  11. PAD: ABIs (2/17) were normal, but there was evidence for "developing fem-pop disease."  ABIs normal in 2/18.  12. Pancreatic cysts on CT.  13. Chronic diastolic CHF - Echo (2/19): EF 60-65%, mild LVH, moderate TR, PASP 37 mmHg.  14. H/o Lyme disease 15. Left breast mass: has decided not to work up.   SH: Widow, lives in [place], nonsmoker.  2 daughters.   FH: Mother with CVA, CAD.  Father with CAD.   ROS: All systems reviewed and negative except as per HPI.   Current Outpatient Medications  Medication Sig Dispense Refill   acetaminophen (TYLENOL) 500 MG tablet Take 500 mg by mouth 2 (two) times daily as needed for moderate pain.     atorvastatin (LIPITOR) 10 MG tablet TAKE (1) TABLET BY MOUTH ONCE DAILY. 90 tablet 0   blood glucose meter kit and supplies Dispense based on patient and insurance preference. Use to check sugars once daily Dx E11.9 1 each 0   cycloSPORINE (RESTASIS) 0.05 % ophthalmic emulsion Place 1 drop into both eyes 2 (two) times daily.     ELIQUIS 5 MG TABS tablet TAKE (1) TABLET BY MOUTH TWICE DAILY. 180 tablet 0   glucose blood (ONETOUCH ULTRA) test strip USE TO TEST BLOOD SUGAR ONCE A DAY 100 strip 2   Lancets (ONETOUCH DELICA PLUS LANCET33G) MISC USE TO TEST BLOOD SUGAR ONCE A DAY 100 each 1   Lutein 20 MG CAPS Take 1 capsule by mouth daily.     metoprolol succinate (TOPROL-XL) 50 MG 24 hr tablet TAKE (1) TABLET BY MOUTH TWICE DAILY. 180 tablet 0   OVER THE COUNTER MEDICATION Med Name: BENE-FIBER Take two (2)  teaspoons by mouth daily with 8 oz water.     Probiotic Product (PROBIOTIC ADVANCED PO) Take by mouth. Once per day     TRADJENTA 5 MG TABS tablet TAKE (1) TABLET BY MOUTH ONCE DAILY. 90 tablet 0   diltiazem (CARDIZEM CD) 120 MG 24 hr capsule Take 1 capsule (120 mg total) by mouth daily. 90 capsule 3   furosemide (LASIX) 40 MG tablet Take 2 tablets (80 mg total) by mouth 2 (two) times daily. 270 tablet 3   potassium chloride SA (KLOR-CON M) 20 MEQ tablet Take 2 tablets (40 mEq total) by mouth daily. 90 tablet 3   No current facility-administered medications for this encounter.    Wt 65.8 kg (145 lb)   SpO2 96%   BMI 24.89 kg/m  General: NAD Neck: JVP 8-9 cm with HJR, no thyromegaly or thyroid nodule.  Lungs: Clear to auscultation bilaterally with normal respiratory effort. CV: Nondisplaced PMI.  Heart irregular S1/S2, no S3/S4, no murmur.  1+ edema to knees.  No carotid bruit.  Normal pedal pulses.  Abdomen: Soft, nontender, no hepatosplenomegaly, no distention.  Skin: Intact without lesions or rashes.  Neurologic: Alert and oriented x 3.  Psych: Normal affect. Extremities: No clubbing or cyanosis.  HEENT: Normal.   Assessment/Plan: 1. Atrial fibrillation:  Chronic, has recurred after DCCV in 12/16 on amiodarone.  She has failed amiodarone and Multaq.  She is not a good sotalol or Tikosyn candidate as QTc has been prolonged.  We have decided on following a rate control and anticoagulation strategy. HR is controlled. - Continue Toprol XL 50 mg bid.    - With orthostatic symptoms, I will decrease diltiazem CD to 120 mg daily.  - Continue Eliquis.  2. HTN: BP controlled.  3. Hyperlipidemia: Good lipids 1/24  4. Chronic diastolic CHF:  NYHA class III symptoms.  She is volume overloaded on exam today.  - Increase Lasix to 80 mg bid, BMET/BNP today and BMET in 10 days - Increase KCl to 40 daily.     - She needs to wear compression stockings during the day.  - She did not tolerate  Jardiance.   Followup in 1 month with APP.   Marca Ancona 07/04/2023

## 2023-07-04 NOTE — Patient Instructions (Signed)
INCREASE Lasix to 80 mg Twice daily  INCREASE Potassium to 40 mEq ( 2 Tabs) daily.  DECREASE Diltiazem to 120 mg daily.   Labs done today, your results will be available in MyChart, we will contact you for abnormal readings.  Repeat blood work in 10 days.  Your physician recommends that you schedule a follow-up appointment in: 1 month  If you have any questions or concerns before your next appointment please send [place] a message through [place] or call our office at [phone].    TO LEAVE A MESSAGE FOR THE NURSE SELECT OPTION 2, PLEASE LEAVE A MESSAGE INCLUDING: YOUR NAME DATE OF BIRTH CALL BACK NUMBER REASON FOR CALL**this is important as we prioritize the call backs  YOU WILL RECEIVE A CALL BACK THE SAME DAY AS LONG AS YOU CALL BEFORE 4:00 PM  At the [facility], you and your health needs are our priority. As part of our continuing mission to provide you with exceptional heart care, we have created designated Provider Care Teams. These Care Teams include your primary Cardiologist (physician) and Advanced Practice Providers (APPs- Physician Assistants and Nurse Practitioners) who all work together to provide you with the care you need, when you need it.   You may see any of the following providers on your designated Care Team at your next follow up: Dr Arvilla Meres Dr Marca Ancona Dr. Dorthula Nettles Dr. Clearnce Hasten Amy Filbert Schilder, NP Robbie Lis, [place] [facility] [place], [place] Brynda Peon, NP [place] Lee, NP Karle Plumber, PharmD   Please be sure to bring in all your medications bottles to every appointment.    Thank you for choosing [facility]

## 2023-07-05 ENCOUNTER — Telehealth (HOSPITAL_COMMUNITY): Payer: Self-pay | Admitting: Cardiology

## 2023-07-05 NOTE — Telephone Encounter (Signed)
We had her listed as taking 60 mg bid Lasix before and I increased her to 80 mg bid.  Was she really taking 30 bid? Daughter needs to check the pill bottle again.  If she was really taking 30 mg bid, she can increase to 40 mg bid.

## 2023-07-05 NOTE — Telephone Encounter (Signed)
Pts daughter called to confirm increase dose in lasix    Daughter felt increase from 30 mg BID to 80 mg BID id quite extreme   Please confirm

## 2023-07-06 MED ORDER — FUROSEMIDE 40 MG PO TABS: 40.0000 mg | ORAL_TABLET | Freq: Two times a day (BID) | ORAL | 3 refills | Status: DC

## 2023-07-06 MED ORDER — POTASSIUM CHLORIDE CRYS ER 20 MEQ PO TBCR: 20.0000 meq | EXTENDED_RELEASE_TABLET | Freq: Every day | ORAL | 3 refills | Status: DC

## 2023-07-06 NOTE — Telephone Encounter (Signed)
Daughter confirms 30 mg twice a day (20 mg 1.5 tab BID)  Will increase to lasix 40 BID  Daughter will keep Kcl daily   Pt was Given order for repeat labs x 10 days  Meds updated

## 2023-08-02 NOTE — Progress Notes (Addendum)
Patient ID: Julie Swanson, female   DOB: 1928-03-22, 89 y.o.   MRN: [id number] PCP: Dr. Lilyan Punt Cardiology: Dr. Shirlee Latch  89 y.o. presents for followup of paroxysmal atrial fibrillation and diastolic CHF.  She had been having runs of rapid tachypalpitations when I saw her initially.  She was quite symptomatic with episodes.  Event monitor showed runs of atrial fibrillation with RVR.  I started her on Multaq for rhythm control given her significant symptoms and on apixaban.  She did not tolerate diltiazem due to ankle swelling and was switched over to Coreg for rate control.  She did not tolerate Coreg and is back on propranolol, which she does ok with.  Echo in 11/14 showed EF 60-65% with mild MR. No exertional chest pain or dyspnea. No lightheadedness.  She went into atrial fibrillation again in 8/16.  She had DCCV in 9/16 back to NSR and was continued on Multaq.  However, she went back into atrial fibrillation shortly after on despite Multaq use.  Multaq was stopped.  Given ongoing fatigue with atrial fibrillation, she was started on amiodarone.  DCCV was done in 12/16, she went back into NSR.  Propranolol was stopped due to bradycardia.  Unfortunately, atrial fibrillation has recurred.  She saw Tereso Newcomer and it was decided to try to reload her on amiodarone and attempt DCCV one more time.  Later in 12/16, I cardioverted her again while on amiodarone.  However, at 09/10/15 appt in atrial fibrillation clinic, she was back in atrial fibrillation.  She developed a tremor on amiodarone.  Amiodarone was stopped with plan to rate control/anticoagulate going forward.  She is now on Toprol XL and diltiazem CD.  Echo in 2/19 showed EF 60-65%, mild LVH, moderate TR, PASP 37 mmHg.   She had an abnormal mammogram but has not wanted to have a breast biopsy.   Today she returns for HF follow up with her daughter. Last visit she had orthostatic symptoms and volume up. Lasix increased and cardizem decreased.  Daughter says they did not make these med changes due to confusion over dosing details at the pharmacy. Julie Swanson is overall feeling fair, remains fatigued. She walks 5 minutes on the treadmill, 5 days a week. Denies palpitations, CP, dizziness, edema, or PND/Orthopnea. Appetite ok. No fever or chills. She does not weigh at home. She manages her medications, with her daughter's assistance. She has macular degeneration and does not see well. She has macular degeneration and does not see well.  She does not weigh at home.Daughter brought list of orthostatic BPs and she is not orthostatic. BP 120s at home. Daughter concerned because PCP noticed orthostatic readings in office.  REDs: 27%  ECG (personally reviewed): none ordered today.  Labs (2/19): hgb 12.9, K 3.9, creatinine 0.95 Labs (4/19): LDL 69, HDL 38 Labs (8/19): hgb 12.6, creatinine 0.98 Labs (9/19): K 3.8, creatinine 1.06, hgb 12.8 Labs (2/20): LDL 68, hgb 12.5 Labs (7/20): K 3.7, creatinine 0.97 Labs (10/20): K 3.9, creatinine 0.92, hgb 12.6 Labs (12/21): LDL 81, HDL 32, K 3.9, creatinine 3.55 Labs (9/22): K 4.3, creatinine 1.05, LDL 68 Labs (6/23): K 3.9, creatinine 1.09, LDL 66 Labs (1/24): LDL 65 Labs (5/24): K 3.9, creatinine 1.01 Labs (9/24): LDL 62, K 3.9, creatinine 7.32 Labs (10/24): K 3.8, creatinine 0.93  PMH: 1. LHC (8/10) with minimal nonobstructive disease.  ETT (10/13) with 2'53" exercise, nonspecific ECG changes with no evidence for ischemia.  2. Mitral valve prolapse: Echo (11/14) with EF 60-65%, mild  MR.  3. Atrial fibrillation:  Event monitor (10/13) with PACs only.  Event monitor (11/14) showed atrial fibrillation with RVR. She does not tolerate metoprolol (nightmares).  She had ankle swelling with diltiazem. Possible fatigue/diarrhea with Coreg. Holter (8/16) with persistent atrial fibrillation.  DCCV to NSR in 9/16.  Back in atrial fibrillation by 10/16 despite Multaq. DCCV to NSR x 2 in 12/16 on amiodarone, but atrial  fibrillation recurred.  Amiodarone stopped. 4. HTN 5. Hyperlipidemia 6. Hysterectomy. 7. Carotid disease: Carotid dopplers (1/15) with prominent plaque, mild stenosis.  - Carotid dopplers (2/20): mild BICA stenosis.  8. Internal hemorrhoids with episodic rectal bleeding.  9. Macular degeneration.  10. PFTs: Mild obstructive lung disease.  11. PAD: ABIs (2/17) were normal, but there was evidence for "developing fem-pop disease."  ABIs normal in 2/18.  12. Pancreatic cysts on CT.  13. Chronic diastolic CHF - Echo (2/19): EF 60-65%, mild LVH, moderate TR, PASP 37 mmHg.  14. H/o Lyme disease 15. Left breast mass: has decided not to work up.   SH: Widow, lives in [place], nonsmoker.  2 daughters.   FH: Mother with CVA, CAD.  Father with CAD.   ROS: All systems reviewed and negative except as per HPI.   Current Outpatient Medications  Medication Sig Dispense Refill   acetaminophen (TYLENOL) 500 MG tablet Take 500 mg by mouth 2 (two) times daily as needed for moderate pain.     atorvastatin (LIPITOR) 10 MG tablet TAKE (1) TABLET BY MOUTH ONCE DAILY. 90 tablet 0   blood glucose meter kit and supplies Dispense based on patient and insurance preference. Use to check sugars once daily Dx E11.9 1 each 0   cycloSPORINE (RESTASIS) 0.05 % ophthalmic emulsion Place 1 drop into both eyes 2 (two) times daily.     diltiazem (CARDIZEM CD) 120 MG 24 hr capsule Take 1 capsule (120 mg total) by mouth daily. 90 capsule 3   ELIQUIS 5 MG TABS tablet TAKE (1) TABLET BY MOUTH TWICE DAILY. 180 tablet 0   furosemide (LASIX) 40 MG tablet Take 1 tablet (40 mg total) by mouth 2 (two) times daily. (Patient taking differently: Take 60 mg by mouth 2 (two) times daily.) 180 tablet 3   glucose blood (ONETOUCH ULTRA) test strip USE TO TEST BLOOD SUGAR ONCE A DAY 100 strip 2   Lancets (ONETOUCH DELICA PLUS LANCET33G) MISC USE TO TEST BLOOD SUGAR ONCE A DAY 100 each 1   Lutein 20 MG CAPS Take 1 capsule by mouth daily.      metoprolol succinate (TOPROL-XL) 50 MG 24 hr tablet TAKE (1) TABLET BY MOUTH TWICE DAILY. 180 tablet 0   OVER THE COUNTER MEDICATION Med Name: BENE-FIBER Take two (2) teaspoons by mouth daily with 8 oz water.     potassium chloride SA (KLOR-CON M) 20 MEQ tablet Take 1 tablet (20 mEq total) by mouth daily. 90 tablet 3   Probiotic Product (PROBIOTIC ADVANCED PO) Take by mouth. Once per day     TRADJENTA 5 MG TABS tablet TAKE (1) TABLET BY MOUTH ONCE DAILY. 90 tablet 0   No current facility-administered medications for this encounter.   Wt Readings from Last 3 Encounters:  08/03/23 67 kg (147 lb 12.8 oz)  07/04/23 65.8 kg (145 lb)  06/29/23 66.4 kg (146 lb 6.4 oz)   BP 122/66   Pulse 85   Wt 67 kg (147 lb 12.8 oz)   SpO2 95%   BMI 25.37 kg/m  Physical Exam General:  NAD. No resp difficulty, walked into clinic HEENT: Normal Neck: Supple. No JVD. Carotids 2+ bilat; no bruits. No lymphadenopathy or thryomegaly appreciated. Cor: PMI nondisplaced. Rrregular rate & rhythm. No rubs, gallops or murmurs. Lungs: Clear Abdomen: Soft, nontender, nondistended. No hepatosplenomegaly. No bruits or masses. Good bowel sounds. Extremities: No cyanosis, clubbing, rash, 2+ BLE pre-tibia edema Neuro: Alert & oriented x 3, cranial nerves grossly intact. Moves all 4 extremities w/o difficulty. Affect pleasant.  Assessment/Plan: 1. Atrial fibrillation:  Chronic, has recurred after DCCV in 12/16 on amiodarone.  She has failed amiodarone and Multaq.  She is not a good sotalol or Tikosyn candidate as QTc has been prolonged.  We have decided on following a rate control and anticoagulation strategy. HR is controlled. - Decrease diltiazem CD to 120 mg daily (orthostasis symptoms). - Continue Toprol XL 50 mg bid.    - Continue Eliquis. No bleeding issues 2. HTN: BP controlled.  - Daughter concerned about orthostasis, decrease Cardizem as above. - I asked daughter to call if  BP creeps up and we can adjust  Cardizem dosage.  3. Hyperlipidemia: Good lipids 1/24  4. Chronic diastolic CHF:  NYHA class II-III symptoms.  She is mildly volume overloaded. ReDs 27%, so R>L HF. Weight up 2 lbs - Increase Lasix to 80 mg bid (has been on 60 bid), BMET/BNP today, repeat BMET in 10 days - Increase KCL to 40 daily. - She needs to wear compression stockings during the day.  - She did not tolerate Jardiance.  5. Fatigue: likely multifactorial. - Diurese as above. - Check iron panel today  Followup in 6 months with Dr. Shirlee Latch.   [facility] pharmacy called today to ensure medication dosage and tablet strength match our records. We asked for easy-open bottles for Harriett Sine.  Anderson Malta Jamis Kryder FNP-BC 08/03/2023  Greater than 50% of the (total minutes 40) visit spent in counseling/coordination of care regarding (diuretic management, GDMT, medication management, orthostasis and cardio-renal connection)

## 2023-08-03 ENCOUNTER — Other Ambulatory Visit (HOSPITAL_COMMUNITY): Payer: Self-pay | Admitting: Cardiology

## 2023-08-03 ENCOUNTER — Ambulatory Visit (HOSPITAL_COMMUNITY)
Admission: RE | Admit: 2023-08-03 | Discharge: 2023-08-03 | Disposition: A | Discharge status: Home / Self Care | Payer: PPO | Source: Ambulatory Visit | Attending: Family Medicine

## 2023-08-03 ENCOUNTER — Other Ambulatory Visit: Payer: Self-pay | Admitting: Family Medicine

## 2023-08-03 ENCOUNTER — Encounter (HOSPITAL_COMMUNITY): Payer: Self-pay

## 2023-08-03 VITALS — BP 122/66 | HR 85 | Wt 147.8 lb

## 2023-08-03 DIAGNOSIS — E785 Hyperlipidemia, unspecified: Secondary | ICD-10-CM | POA: Diagnosis not present

## 2023-08-03 DIAGNOSIS — H3530 Unspecified macular degeneration: Secondary | ICD-10-CM | POA: Insufficient documentation

## 2023-08-03 DIAGNOSIS — Z79899 Other long term (current) drug therapy: Secondary | ICD-10-CM | POA: Insufficient documentation

## 2023-08-03 DIAGNOSIS — Z7901 Long term (current) use of anticoagulants: Secondary | ICD-10-CM | POA: Insufficient documentation

## 2023-08-03 DIAGNOSIS — I10 Essential (primary) hypertension: Secondary | ICD-10-CM | POA: Diagnosis not present

## 2023-08-03 DIAGNOSIS — R5383 Other fatigue: Secondary | ICD-10-CM

## 2023-08-03 DIAGNOSIS — I5032 Chronic diastolic (congestive) heart failure: Secondary | ICD-10-CM | POA: Insufficient documentation

## 2023-08-03 DIAGNOSIS — I110 Hypertensive heart disease with heart failure: Secondary | ICD-10-CM | POA: Insufficient documentation

## 2023-08-03 DIAGNOSIS — I4819 Other persistent atrial fibrillation: Secondary | ICD-10-CM | POA: Insufficient documentation

## 2023-08-03 LAB — BASIC METABOLIC PANEL
Anion gap: 9 (ref 5–15)
BUN: 9 mg/dL (ref 8–23)
CO2: 27 mmol/L (ref 22–32)
Calcium: 9 mg/dL (ref 8.9–10.3)
Chloride: 101 mmol/L (ref 98–111)
Creatinine, Ser: 1.02 mg/dL — ABNORMAL HIGH (ref 0.44–1.00)
GFR, Estimated: 51 mL/min — ABNORMAL LOW (ref >60)
Glucose, Bld: 164 mg/dL — ABNORMAL HIGH (ref 70–99)
Potassium: 3.8 mmol/L (ref 3.5–5.1)
Sodium: 137 mmol/L (ref 135–145)

## 2023-08-03 LAB — CBC
HCT: 37 % (ref 36.0–46.0)
Hemoglobin: 12.2 g/dL (ref 12.0–15.0)
MCH: 30.3 pg (ref 26.0–34.0)
MCHC: 33 g/dL (ref 30.0–36.0)
MCV: 91.8 fL (ref 80.0–100.0)
Platelets: 227 10*3/uL (ref 150–400)
RBC: 4.03 MIL/uL (ref 3.87–5.11)
RDW: 13.8 % (ref 11.5–15.5)
WBC: 6.5 10*3/uL (ref 4.0–10.5)
nRBC: 0 % (ref 0.0–0.2)

## 2023-08-03 LAB — IRON AND TIBC
Iron: 98 ug/dL (ref 28–170)
Saturation Ratios: 20 % (ref 10.4–31.8)
TIBC: 493 ug/dL — ABNORMAL HIGH (ref 250–450)
UIBC: 395 ug/dL

## 2023-08-03 LAB — FERRITIN: Ferritin: 24 ng/mL (ref 11–307)

## 2023-08-03 LAB — BRAIN NATRIURETIC PEPTIDE: B Natriuretic Peptide: 516.2 pg/mL — ABNORMAL HIGH (ref 0.0–100.0)

## 2023-08-03 MED ORDER — DILTIAZEM HCL ER COATED BEADS 120 MG PO CP24: 120.0000 mg | ORAL_CAPSULE | Freq: Every day | ORAL | 3 refills | Status: DC

## 2023-08-03 MED ORDER — FUROSEMIDE 40 MG PO TABS: 80.0000 mg | ORAL_TABLET | Freq: Two times a day (BID) | ORAL | 3 refills | Status: DC

## 2023-08-03 MED ORDER — POTASSIUM CHLORIDE CRYS ER 20 MEQ PO TBCR: 40.0000 meq | EXTENDED_RELEASE_TABLET | Freq: Every day | ORAL | 3 refills | Status: DC

## 2023-08-03 NOTE — Addendum Note (Signed)
Encounter addended by: Jacklynn Ganong, FNP on: 08/03/2023 10:39 AM  Actions taken: Clinical Note Signed

## 2023-08-03 NOTE — Progress Notes (Signed)
ReDS Vest / Clip - 08/03/23 1000       ReDS Vest / Clip   Station Marker A    Ruler Value 28    ReDS Value Range Low volume    ReDS Actual Value 27

## 2023-08-03 NOTE — Patient Instructions (Signed)
Labs done today. We will contact you only if your labs are abnormal.  INCREASE Lasix to 80mg  (2 tablets) by mouth 2 times daily.  INCREASE Potassium to (2 tablets) by mouth daily.  DECREASE Ditiazem to 120mg  (1 tablet) by mouth daily.   No other medication changes were made. Please continue all current medications as prescribed.  Your physician recommends that you schedule a follow-up appointment in: 10 days for a lab only appointment and in 6 months with Dr. Shirlee Latch. Please contact our office in March 2025 to schedule a May 2025 appointment.   If you have any questions or concerns before your next appointment please send [place] a message through [place] or call our office at [phone].    TO LEAVE A MESSAGE FOR THE NURSE SELECT OPTION 2, PLEASE LEAVE A MESSAGE INCLUDING: YOUR NAME DATE OF BIRTH CALL BACK NUMBER REASON FOR CALL**this is important as we prioritize the call backs  YOU WILL RECEIVE A CALL BACK THE SAME DAY AS LONG AS YOU CALL BEFORE 4:00 PM   Do the following things EVERYDAY: Weigh yourself in the morning before breakfast. Write it down and keep it in a log. Take your medicines as prescribed Eat low salt foods--Limit salt (sodium) to 2000 mg per day.  Stay as active as you can everyday Limit all fluids for the day to less than 2 liters   At the [facility], you and your health needs are our priority. As part of our continuing mission to provide you with exceptional heart care, we have created designated Provider Care Teams. These Care Teams include your primary Cardiologist (physician) and Advanced Practice Providers (APPs- Physician Assistants and Nurse Practitioners) who all work together to provide you with the care you need, when you need it.   You may see any of the following providers on your designated Care Team at your next follow up: Dr Arvilla Meres Dr Marca Ancona Dr. Marcos Eke, NP Robbie Lis, [place] [facility] [place], [place] Brynda Peon, NP Karle Plumber, PharmD   Please be sure to bring in all your medications bottles to every appointment.    Thank you for choosing [facility]

## 2023-08-27 ENCOUNTER — Encounter (HOSPITAL_COMMUNITY): Payer: Self-pay

## 2023-08-27 NOTE — Progress Notes (Unsigned)
Medication Samples have been provided to the patient.  Drug name: Eliquis       Strength: 5 mg        Qty: 2  LOT: [id number]  Exp.Date: 102026  Dosing instructions: Take 1 tablet Twice daily   The patient has been instructed regarding the correct time, dose, and frequency of taking this medication, including desired effects and most common side effects.   Smitty Cords Cassandria Drew 9:32 AM 08/27/2023

## 2023-09-13 ENCOUNTER — Telehealth (HOSPITAL_COMMUNITY): Payer: Self-pay | Admitting: Cardiology

## 2023-09-13 ENCOUNTER — Other Ambulatory Visit (HOSPITAL_COMMUNITY): Payer: Self-pay | Admitting: Cardiology

## 2023-09-13 NOTE — Telephone Encounter (Signed)
 Rx updated.

## 2023-09-13 NOTE — Telephone Encounter (Signed)
 Patients daughter called to report they would like to hold steady at diltiazem  at 240 opposed to decreasing to 120  B/p is maintaining at current dose   Pt did change Lasix   to 80 kcl to 40 daily, however only did one set of med changes that was recommended at last OV  [phone] Dagoberto daughter  Will confirm with provider  and update RX

## 2023-10-10 ENCOUNTER — Other Ambulatory Visit: Payer: Self-pay

## 2023-10-10 DIAGNOSIS — N1831 Chronic kidney disease, stage 3a: Secondary | ICD-10-CM

## 2023-10-10 NOTE — Addendum Note (Signed)
Addended by: Sande Brothers on: 10/10/2023 04:44 PM   Modules accepted: Orders

## 2023-10-11 ENCOUNTER — Other Ambulatory Visit: Payer: Self-pay | Admitting: Family Medicine

## 2023-10-11 MED ORDER — ONETOUCH ULTRA VI STRP: ORAL_STRIP | 2 refills | Status: AC

## 2023-10-11 MED ORDER — ONETOUCH DELICA PLUS LANCET33G MISC: 1 refills | Status: AC

## 2023-10-11 NOTE — Telephone Encounter (Signed)
Copied from CRM [id number]. Topic: Clinical - Medication Refill >> Oct 11, 2023  3:25 PM Shelah Lewandowsky wrote: Most Recent Primary Care Visit:  Provider: Lilyan Punt A  Department: [facility]  Visit Type: OFFICE VISIT  Date: 06/29/2023  Medication: glucose blood (ONETOUCH ULTRA) blue test strip (ONETOUCH DELICA PLUS LANCET33G) MISC  Has the patient contacted their pharmacy? Yes (Agent: If no, request that the patient contact the pharmacy for the refill. If patient does not wish to contact the pharmacy document the reason why and proceed with request.) (Agent: If yes, when and what did the pharmacy advise?)  Is this the correct pharmacy for this prescription? Yes If no, delete pharmacy and type the correct one.  This is the patient's preferred pharmacy:  [facility] [place], [place] - [id number] [place] [address] Phone: [phone] Fax: [phone]   Has the prescription been filled recently? Yes  Is the patient out of the medication? No  Has the patient been seen for an appointment in the last year OR does the patient have an upcoming appointment? Yes  Can we respond through MyChart? No  Agent: Please be advised that Rx refills may take up to 3 business days. We ask that you follow-up with your pharmacy.

## 2023-11-28 ENCOUNTER — Ambulatory Visit: Payer: PPO | Admitting: Family Medicine

## 2023-12-02 ENCOUNTER — Other Ambulatory Visit: Payer: Self-pay | Admitting: Family Medicine

## 2023-12-02 ENCOUNTER — Other Ambulatory Visit (HOSPITAL_COMMUNITY): Payer: Self-pay | Admitting: Cardiology

## 2023-12-03 ENCOUNTER — Other Ambulatory Visit: Payer: Self-pay

## 2023-12-03 MED ORDER — LINAGLIPTIN 5 MG PO TABS: 5.0000 mg | ORAL_TABLET | Freq: Every day | ORAL | 0 refills | Status: DC

## 2023-12-06 MED ORDER — DILTIAZEM HCL ER COATED BEADS 240 MG PO CP24: 240.0000 mg | ORAL_CAPSULE | Freq: Every day | ORAL | 3 refills | Status: DC

## 2023-12-06 NOTE — Addendum Note (Signed)
 Addended by: Theresia Bough on: 12/06/2023 12:58 PM   Modules accepted: Orders

## 2023-12-13 DIAGNOSIS — E1122 Type 2 diabetes mellitus with diabetic chronic kidney disease: Secondary | ICD-10-CM | POA: Diagnosis not present

## 2023-12-13 DIAGNOSIS — N1831 Chronic kidney disease, stage 3a: Secondary | ICD-10-CM | POA: Diagnosis not present

## 2023-12-14 LAB — BASIC METABOLIC PANEL WITHOUT GFR
BUN/Creatinine Ratio: 12 (calc) (ref 6–22)
BUN: 12 mg/dL (ref 7–25)
CO2: 29 mmol/L (ref 20–32)
Calcium: 8.6 mg/dL (ref 8.6–10.4)
Chloride: 94 mmol/L — ABNORMAL LOW (ref 98–110)
Creat: 1.01 mg/dL — ABNORMAL HIGH (ref 0.60–0.95)
Glucose, Bld: 210 mg/dL — ABNORMAL HIGH (ref 65–99)
Potassium: 3.5 mmol/L (ref 3.5–5.3)
Sodium: 134 mmol/L — ABNORMAL LOW (ref 135–146)

## 2023-12-14 LAB — HEMOGLOBIN A1C
Hgb A1c MFr Bld: 8.5 %{Hb} — ABNORMAL HIGH (ref <5.7)
Mean Plasma Glucose: 197 mg/dL
eAG (mmol/L): 10.9 mmol/L

## 2023-12-17 ENCOUNTER — Other Ambulatory Visit (HOSPITAL_COMMUNITY)
Admission: RE | Admit: 2023-12-17 | Discharge: 2023-12-17 | Disposition: A | Discharge status: Home / Self Care | Source: Ambulatory Visit | Attending: Family Medicine | Admitting: Family Medicine

## 2023-12-17 ENCOUNTER — Ambulatory Visit (HOSPITAL_COMMUNITY)
Admission: RE | Admit: 2023-12-17 | Discharge: 2023-12-17 | Disposition: A | Discharge status: Home / Self Care | Source: Ambulatory Visit | Attending: Family Medicine | Admitting: Family Medicine

## 2023-12-17 ENCOUNTER — Ambulatory Visit: Payer: PPO | Admitting: Family Medicine

## 2023-12-17 VITALS — BP 115/70 | HR 78 | Temp 97.7°F | Wt 142.0 lb

## 2023-12-17 DIAGNOSIS — K625 Hemorrhage of anus and rectum: Secondary | ICD-10-CM | POA: Diagnosis not present

## 2023-12-17 DIAGNOSIS — R1033 Periumbilical pain: Secondary | ICD-10-CM | POA: Diagnosis not present

## 2023-12-17 DIAGNOSIS — K760 Fatty (change of) liver, not elsewhere classified: Secondary | ICD-10-CM | POA: Diagnosis not present

## 2023-12-17 DIAGNOSIS — K5730 Diverticulosis of large intestine without perforation or abscess without bleeding: Secondary | ICD-10-CM | POA: Diagnosis not present

## 2023-12-17 LAB — CBC WITH DIFFERENTIAL/PLATELET
Abs Immature Granulocytes: 0.03 10*3/uL (ref 0.00–0.07)
Basophils Absolute: 0 10*3/uL (ref 0.0–0.1)
Basophils Relative: 0 %
Eosinophils Absolute: 0.1 10*3/uL (ref 0.0–0.5)
Eosinophils Relative: 2 %
HCT: 37.5 % (ref 36.0–46.0)
Hemoglobin: 11.9 g/dL — ABNORMAL LOW (ref 12.0–15.0)
Immature Granulocytes: 0 %
Lymphocytes Relative: 17 %
Lymphs Abs: 1.2 10*3/uL (ref 0.7–4.0)
MCH: 29.1 pg (ref 26.0–34.0)
MCHC: 31.7 g/dL (ref 30.0–36.0)
MCV: 91.7 fL (ref 80.0–100.0)
Monocytes Absolute: 0.6 10*3/uL (ref 0.1–1.0)
Monocytes Relative: 9 %
Neutro Abs: 5 10*3/uL (ref 1.7–7.7)
Neutrophils Relative %: 72 %
Platelets: 246 10*3/uL (ref 150–400)
RBC: 4.09 MIL/uL (ref 3.87–5.11)
RDW: 13.8 % (ref 11.5–15.5)
WBC: 7 10*3/uL (ref 4.0–10.5)
nRBC: 0 % (ref 0.0–0.2)

## 2023-12-17 LAB — COMPREHENSIVE METABOLIC PANEL WITH GFR
ALT: 11 U/L (ref 0–44)
AST: 17 U/L (ref 15–41)
Albumin: 3.4 g/dL — ABNORMAL LOW (ref 3.5–5.0)
Alkaline Phosphatase: 103 U/L (ref 38–126)
Anion gap: 11 (ref 5–15)
BUN: 10 mg/dL (ref 8–23)
CO2: 25 mmol/L (ref 22–32)
Calcium: 8.8 mg/dL — ABNORMAL LOW (ref 8.9–10.3)
Chloride: 96 mmol/L — ABNORMAL LOW (ref 98–111)
Creatinine, Ser: 0.92 mg/dL (ref 0.44–1.00)
GFR, Estimated: 58 mL/min — ABNORMAL LOW (ref >60)
Glucose, Bld: 209 mg/dL — ABNORMAL HIGH (ref 70–99)
Potassium: 3.9 mmol/L (ref 3.5–5.1)
Sodium: 132 mmol/L — ABNORMAL LOW (ref 135–145)
Total Bilirubin: 0.9 mg/dL (ref 0.0–1.2)
Total Protein: 6.8 g/dL (ref 6.5–8.1)

## 2023-12-17 LAB — LIPASE, BLOOD: Lipase: 37 U/L (ref 11–51)

## 2023-12-17 MED ORDER — IOHEXOL 9 MG/ML PO SOLN
ORAL | Status: AC
  Filled 2023-12-17: qty 1000

## 2023-12-17 MED ORDER — IOHEXOL 300 MG/ML  SOLN
100.0000 mL | Freq: Once | INTRAMUSCULAR | Status: AC | PRN
  Administered 2023-12-17: 100 mL via INTRAVENOUS

## 2023-12-17 NOTE — Progress Notes (Signed)
 Subjective:    Patient ID: Julie Swanson, female    DOB: [birth year], 89 y.o.   MRN: [id number]  HPI  Low to no appetite Sore abd with blood in stool - daughter stopped eliquis since saturday  Reports some constipation , last bm 1.5 days ago   Discussed the use of AI scribe software for clinical note transcription with the patient, who gave verbal consent to proceed.  History of Present Illness   The patient presents with abdominal pain and rectal bleeding. She is accompanied by her daughter, Darel Hong, who is her primary caregiver.  She has been experiencing abdominal pain localized to the upper abdomen since last Tuesday. The pain was severe enough to cause discomfort when pressure was applied, such as laying a hand on it, and was exacerbated by car rides due to bumps. The soreness persisted through the weekend but has improved today.  She noticed bright red blood on her underwear starting last Thursday, with the bleeding possibly beginning on Wednesday. The bleeding is described as not a large amount, with bright red when fresh and brown when dry.  She has experienced constipation for about a week, with normal bowel movements prior to this period. No vomiting spells have occurred over the past few weeks. She reports tenderness in the abdomen when pressure is applied.  She reports experiencing low energy for the past two to three weeks, describing it as 'not feeling on top of her game.'  Her appetite has been good, but she has noticed a decrease in the quantity of food consumed, leading to weight loss from 146 pounds last year to 142 pounds currently. She snacks more frequently on items like popcorn and [employer].  She stopped taking Eliquis on Sunday. Her current medications and dosages were not discussed.        Review of Systems     Objective:   Physical Exam General-in no acute distress Eyes-no discharge Lungs-respiratory rate normal, CTA CV-no murmurs,RRR Extremities skin  warm dry no edema Neuro grossly normal Behavior normal, alert  Sig get tenderness in abdomen throughout all quadrants worse in the right upper quadrant and left lower quadrant no rebound but some increase discomfort to light touch abnormal exam      Assessment & Plan:  Assessment and Plan    Abdominal Pain with Rectal Bleeding Differential includes diverticulitis, hemorrhoids, or other gastrointestinal issues. Eliquis held due to bleeding. ER visit advised for significant bleeding. - Order stat blood work for hemoglobin and relevant parameters. - Order CT scan of abdomen with contrast. - Hold Eliquis until further evaluation. - Refer to gastroenterology if CT and blood work are unremarkable. - Advise ER visit for large blood in stool or vomit, or severe symptoms.  Elevated Hemoglobin A1c A1c elevated at 8.5%, indicating suboptimal glycemic control. Increased sugar intake may contribute. - Advise reducing sugar intake. - Monitor blood glucose and adjust diet.  General Health Maintenance Kidney function managed with creatinine at 1.01. EGFR assessment planned. - Conduct blood work to assess EGFR.  Follow-up Follow-up based on blood work and CT results. ER visit for significant symptom changes or critical blood work values. - Review blood work results by 3-5 PM today. - Coordinate CT scan for this afternoon or tomorrow morning. - Contact with results and further instructions.      With the severe abdominal tenderness along with rectal bleeding needs stat lab work as well as stat scan to rule out diverticulitis or more serious issue Family and  patient does not want to be through the ER they prefer outpatient. Await labs await imaging When to go to the emergency room was discussed  CAT scan came back showing circumferential thickening on the transverse colon.  I did touch base with Dr. Marletta Lor.  They will see her next week.  It should be noted that this patient has a significant  breast nodule of the left side consistent with the possibility of breast cancer.  This has been present for several years.  It was recommended to do a biopsy but the patient deferred because she did not want to go through any type of test.  If this particular situation I have encouraged the patient to meet with gastroenterology to discuss how to proceed from here.  It is quite possible the patient may choose not to get a colonoscopy.  But a colonoscopy could help with definitive diagnosis and potential biopsy of that area so that the patient would have a full appreciation and understanding what she is facing.  It would not surprise me if it was cancer the patient may choose more of a palliative approach but if it was colitis of some sort or vascular she may be open toward other measures-nonetheless at the very least I think she should do a consultation with GI

## 2023-12-18 ENCOUNTER — Telehealth: Payer: Self-pay | Admitting: *Deleted

## 2023-12-18 ENCOUNTER — Other Ambulatory Visit: Payer: Self-pay

## 2023-12-18 DIAGNOSIS — R1033 Periumbilical pain: Secondary | ICD-10-CM

## 2023-12-18 NOTE — Telephone Encounter (Unsigned)
 Copied from CRM [id number]. Topic: Referral - Status >> Dec 18, 2023  1:41 PM Pierre Bali B wrote: Reason for CRM: patient daughter called in to check on the referral status to dr.man gastroenterology finding of CT that was done on April 7,2025. And needs to be faxed .  [facility]   Fax #: [phone]

## 2023-12-18 NOTE — Telephone Encounter (Signed)
 Please tell Darel Hong that I did call Dr. Kenna Gilbert office.  I gave them all the information and my cell number.  I am waiting on Dr.Mann to call me back.  If they do not call me back by tomorrow early afternoon I will call them again

## 2023-12-19 ENCOUNTER — Telehealth: Payer: Self-pay | Admitting: Family Medicine

## 2023-12-19 NOTE — Telephone Encounter (Signed)
 Nurses I would like to go ahead and get her in with [facility] gastroenterology Please call their office and have one of their providers (MD, NP, PA) call me  My cell phone-[phone]  Patient has intermittent blood in stool as well as abnormal CT scan we are requesting that they see her relatively soon may possibly need colonoscopy thank you

## 2023-12-19 NOTE — Telephone Encounter (Signed)
 Nurse has spoken with the daughter and they are trying to get her into [place] but if unsuccessful they will see Dr. Queen Blossom group I have full confidence in both groups Ultimately the choice of where she is seen is driven by the family

## 2023-12-19 NOTE — Telephone Encounter (Signed)
 Nurses I was able to communicate with Dr. Marletta Lor Either he or his NP will see Julie Swanson next week Their office will call the patient Please put in urgent referral for [facility] gastroenterology-Dr. Marletta Lor due to rectal bleeding  It is important that the appointment is called to the patient's daughter-Julie Swanson [phone]

## 2023-12-19 NOTE — Telephone Encounter (Signed)
 Pt's daughter has been made aware of patient's referral and appts, she states she has already received notice from Dr Queen Blossom office, however she is calling Dr Kenna Gilbert office to check status of that referral before she decides which GI office she will use.

## 2023-12-19 NOTE — Telephone Encounter (Signed)
 See other message

## 2023-12-19 NOTE — Telephone Encounter (Signed)
 Left a message for the patient daughter to return call

## 2023-12-24 ENCOUNTER — Inpatient Hospital Stay: Admitting: Gastroenterology

## 2023-12-26 ENCOUNTER — Telehealth: Payer: Self-pay | Admitting: Family Medicine

## 2023-12-26 NOTE — Telephone Encounter (Signed)
 Requesting follow up appointment with Dr Fairy Homer

## 2023-12-27 ENCOUNTER — Encounter: Payer: Self-pay | Admitting: Internal Medicine

## 2023-12-27 ENCOUNTER — Ambulatory Visit (INDEPENDENT_AMBULATORY_CARE_PROVIDER_SITE_OTHER): Admitting: Internal Medicine

## 2023-12-27 ENCOUNTER — Other Ambulatory Visit: Payer: Self-pay

## 2023-12-27 ENCOUNTER — Encounter: Payer: Self-pay | Admitting: *Deleted

## 2023-12-27 ENCOUNTER — Encounter (HOSPITAL_COMMUNITY): Payer: Self-pay

## 2023-12-27 ENCOUNTER — Encounter (HOSPITAL_COMMUNITY)
Admission: RE | Admit: 2023-12-27 | Discharge: 2023-12-27 | Disposition: A | Discharge status: Home / Self Care | Source: Ambulatory Visit | Attending: Internal Medicine | Admitting: Internal Medicine

## 2023-12-27 VITALS — BP 117/71 | HR 86 | Temp 97.6°F | Ht 64.0 in | Wt 142.8 lb

## 2023-12-27 DIAGNOSIS — R630 Anorexia: Secondary | ICD-10-CM | POA: Diagnosis not present

## 2023-12-27 DIAGNOSIS — R933 Abnormal findings on diagnostic imaging of other parts of digestive tract: Secondary | ICD-10-CM | POA: Diagnosis not present

## 2023-12-27 DIAGNOSIS — R1084 Generalized abdominal pain: Secondary | ICD-10-CM

## 2023-12-27 DIAGNOSIS — K625 Hemorrhage of anus and rectum: Secondary | ICD-10-CM | POA: Diagnosis not present

## 2023-12-27 DIAGNOSIS — R109 Unspecified abdominal pain: Secondary | ICD-10-CM

## 2023-12-27 DIAGNOSIS — Z7901 Long term (current) use of anticoagulants: Secondary | ICD-10-CM | POA: Diagnosis not present

## 2023-12-27 DIAGNOSIS — R634 Abnormal weight loss: Secondary | ICD-10-CM

## 2023-12-27 NOTE — Patient Instructions (Signed)
 We will schedule you for urgent colonoscopy to be performed on Monday.  It was very nice meeting both of you today.  I hope you have a blessed Easter.  Dr. Mordechai April

## 2023-12-27 NOTE — Progress Notes (Signed)
 Primary Care Physician:  Babs Sciara, MD Primary Gastroenterologist:  Dr. Marletta Lor  Chief Complaint  Patient presents with   Rectal Bleeding    Hasn't had any bleeding since last Monday    HPI:   Julie Swanson is a 89 y.o. female who presents to clinic today by referral from her PCP Dr. Gerda Diss for evaluation.  She is accompanied by her daughter.  Patient states she was in her normal state of health until approximately 2 weeks ago when she had sudden onset of abdominal pain, primarily periumbilical, severe, tender to palpation.  Also had rectal bleeding.  She stopped her Eliquis for a few days.  She underwent urgent CT imaging which I personally reviewed which showed thickening of her mid to distal transverse colon.  Patient's last colonoscopy 10 years ago which she states was WNL.  Reports family history of colon cancer in her brother.  States her bleeding has stopped as of 1 week ago.  Abdominal pain has resolved.  She has resumed her Eliquis.  Does note chronic changes of poor appetite as well as weight loss of approximately 4 to 5 pounds.  Past Medical History:  Diagnosis Date   Advance directive in chart 06/28/2022   Patient has advanced directive that was scanned into the system.  This advanced directive indicates to withdraw and withhold life-prolonging measures if it is felt that she has an incurable or irreversible condition, unconscious and with high degree of medical uncertainty will never regain consciousness or suffer from advanced dementia.  Advanced directive scanned into the system   Arthritis    Atrial fibrillation (HCC)    CAD (coronary artery disease)    Chest pain    CHF (congestive heart failure) (HCC)    Complication of anesthesia    pt states "hard to wake up"   HLD (hyperlipidemia)    HTN (hypertension)    Hyperkalemia    Macular degeneration    MVP (mitral valve prolapse)    Osteoporosis    Palpitations     Past Surgical History:  Procedure  Laterality Date   ABDOMINAL HYSTERECTOMY     BREAST EXCISIONAL BIOPSY Left    BREAST EXCISIONAL BIOPSY Right    CARDIOVERSION N/A 05/13/2015   Procedure: CARDIOVERSION;  Surgeon: Laurey Morale, MD;  Location: [facility] ENDOSCOPY;  Service: Cardiovascular;  Laterality: N/A;   CARDIOVERSION N/A 08/13/2015   Procedure: CARDIOVERSION;  Surgeon: Laurey Morale, MD;  Location: [facility] ENDOSCOPY;  Service: Cardiovascular;  Laterality: N/A;   CARDIOVERSION N/A 08/31/2015   Procedure: CARDIOVERSION;  Surgeon: Laurey Morale, MD;  Location: [facility] ENDOSCOPY;  Service: Cardiovascular;  Laterality: N/A;   CATARACT EXTRACTION     BOTH EYES   COLONOSCOPY     COLONOSCOPY N/A 08/14/2014   Procedure: COLONOSCOPY;  Surgeon: Malissa Hippo, MD;  Location: [facility] ENDO SUITE;  Service: Endoscopy;  Laterality: N/A;  225   CYSTECTOMY     BREAST   hysterectomy - unknown type     TOOTH EXTRACTION Left 12/30/2020    Current Outpatient Medications  Medication Sig Dispense Refill   acetaminophen (TYLENOL) 500 MG tablet Take 500 mg by mouth 2 (two) times daily as needed for moderate pain.     apixaban (ELIQUIS) 5 MG TABS tablet TAKE (1) TABLET BY MOUTH TWICE DAILY. 180 tablet 3   atorvastatin (LIPITOR) 10 MG tablet TAKE (1) TABLET BY MOUTH ONCE DAILY. 90 tablet 3   blood glucose meter kit and supplies Dispense based on  patient and insurance preference. Use to check sugars once daily Dx E11.9 1 each 0   cycloSPORINE (RESTASIS) 0.05 % ophthalmic emulsion Place 1 drop into both eyes 2 (two) times daily.     diltiazem (CARDIZEM CD) 240 MG 24 hr capsule Take 1 capsule (240 mg total) by mouth daily. 90 capsule 3   furosemide (LASIX) 40 MG tablet Take 2 tablets (80 mg total) by mouth 2 (two) times daily. 360 tablet 3   glucose blood (ONETOUCH ULTRA) test strip Use as instructed 100 strip 2   Lancets (ONETOUCH DELICA PLUS LANCET33G) MISC USE TO TEST BLOOD SUGAR ONCE A DAY 100 each 1   linagliptin (TRADJENTA) 5 MG TABS tablet Take 1 tablet  (5 mg total) by mouth daily. 90 tablet 0   Lutein 20 MG CAPS Take 1 capsule by mouth daily.     metoprolol succinate (TOPROL-XL) 50 MG 24 hr tablet TAKE (1) TABLET BY MOUTH TWICE DAILY. 180 tablet 3   OVER THE COUNTER MEDICATION Med Name: BENE-FIBER Take two (2) teaspoons by mouth daily with 8 oz water.     potassium chloride SA (KLOR-CON M) 20 MEQ tablet Take 2 tablets (40 mEq total) by mouth daily. 180 tablet 3   Probiotic Product (PROBIOTIC ADVANCED PO) Take by mouth. Once per day     No current facility-administered medications for this visit.    Allergies as of 12/27/2023 - Review Complete 12/27/2023  Allergen Reaction Noted   Valtrex [valacyclovir hcl] Palpitations 06/17/2012   Antihistamines, chlorpheniramine-type Other (See Comments) 01/05/2011   Evista [raloxifene] Other (See Comments) 04/27/2009   Feldene [piroxicam] Other (See Comments) 06/29/2009   Januvia [sitagliptin]  06/09/2021   Other  05/21/2018   Toprol xl [metoprolol tartrate]  06/18/2012   Tropicamide  06/18/2012   Alpha-gal  02/15/2022    Family History  Problem Relation Age of Onset   Heart disease Mother    Stroke Mother        ane melanoma   Heart disease Father    Bladder Cancer Father    Cancer Brother    CAD Brother        ALL   Stroke Other    Heart attack Other    CAD Sister        X2    Social History   Socioeconomic History   Marital status: Widowed    Spouse name: Not on file   Number of children: 2   Years of education: Not on file   Highest education level: Not on file  Occupational History   Not on file  Tobacco Use   Smoking status: Never   Smokeless tobacco: Never  Substance and Sexual Activity   Alcohol use: No   Drug use: No   Sexual activity: Not on file  Other Topics Concern   Not on file  Social History Narrative   2 daughter, Lenon Radar and Marily Shows.   1 grandson   1 step grandson   Social Drivers of Corporate investment banker Strain: Low Risk  (12/22/2022)    Overall Financial Resource Strain (CARDIA)    Difficulty of Paying Living Expenses: Not hard at all  Food Insecurity: No Food Insecurity (12/22/2022)   Hunger Vital Sign    Worried About Running Out of Food in the Last Year: Never true    Ran Out of Food in the Last Year: Never true  Transportation Needs: No Transportation Needs (12/22/2022)   PRAPARE - Transportation    Lack of  Transportation (Medical): No    Lack of Transportation (Non-Medical): No  Physical Activity: Insufficiently Active (12/22/2022)   Exercise Vital Sign    Days of Exercise per Week: 4 days    Minutes of Exercise per Session: 10 min  Stress: No Stress Concern Present (12/22/2022)   [employer] of Occupational Health - Occupational Stress Questionnaire    Feeling of Stress : Not at all  Social Connections: Moderately Isolated (12/22/2022)   Social Connection and Isolation Panel [NHANES]    Frequency of Communication with Friends and Family: More than three times a week    Frequency of Social Gatherings with Friends and Family: Three times a week    Attends Religious Services: 1 to 4 times per year    Active Member of Clubs or Organizations: No    Attends Banker Meetings: Never    Marital Status: Widowed  Intimate Partner Violence: Not At Risk (12/22/2022)   Humiliation, Afraid, Rape, and Kick questionnaire    Fear of Current or Ex-Partner: No    Emotionally Abused: No    Physically Abused: No    Sexually Abused: No    Subjective: Review of Systems  Constitutional:  Negative for chills and fever.  HENT:  Negative for congestion and hearing loss.   Eyes:  Negative for blurred vision and double vision.  Respiratory:  Negative for cough and shortness of breath.   Cardiovascular:  Negative for chest pain and palpitations.  Gastrointestinal:  Negative for abdominal pain, blood in stool, constipation, diarrhea, heartburn, melena and vomiting.  Genitourinary:  Negative for dysuria and urgency.   Musculoskeletal:  Negative for joint pain and myalgias.  Skin:  Negative for itching and rash.  Neurological:  Negative for dizziness and headaches.  Psychiatric/Behavioral:  Negative for depression. The patient is not nervous/anxious.        Objective: BP 117/71 (BP Location: Right Arm, Patient Position: Sitting, Cuff Size: Normal)   Pulse 86   Temp 97.6 F (36.4 C) (Oral)   Ht 5\' 4"  (1.626 m)   Wt 142 lb 12.8 oz (64.8 kg)   SpO2 96%   BMI 24.51 kg/m  Physical Exam Constitutional:      Appearance: Normal appearance.  HENT:     Head: Normocephalic and atraumatic.  Eyes:     Extraocular Movements: Extraocular movements intact.     Conjunctiva/sclera: Conjunctivae normal.  Cardiovascular:     Rate and Rhythm: Normal rate and regular rhythm.  Pulmonary:     Effort: Pulmonary effort is normal.     Breath sounds: Normal breath sounds.  Abdominal:     General: Bowel sounds are normal.     Palpations: Abdomen is soft.  Musculoskeletal:        General: No swelling. Normal range of motion.     Cervical back: Normal range of motion and neck supple.  Skin:    General: Skin is warm and dry.     Coloration: Skin is not jaundiced.  Neurological:     General: No focal deficit present.     Mental Status: She is alert and oriented to person, place, and time.  Psychiatric:        Mood and Affect: Mood normal.        Behavior: Behavior normal.      Assessment: *Rectal bleeding *Abdominal Pain *Abnormal CT scan abdomen  *Chronic anticoagulation with Eliquis *Weight loss *Poor appetite  Plan: Discussed CT findings in depth with patient and daughter today.  Showed them images.  I am concerned for underlying colon malignancy.  Will schedule for urgent colonoscopy to be performed on Monday.  Patient will need to hold her Eliquis x 48 hours.  Counseled on slight increased risk of cardiovascular event during this time and they are agreeable.  Further recommendations to  follow.  Thank you Dr. Fairy Homer for the kind referral  12/27/2023 11:35 AM   Disclaimer: This note was dictated with voice recognition software. Similar sounding words can inadvertently be transcribed and may not be corrected upon review.

## 2023-12-28 ENCOUNTER — Ambulatory Visit: Payer: PPO

## 2023-12-31 ENCOUNTER — Encounter: Payer: Self-pay | Admitting: *Deleted

## 2023-12-31 ENCOUNTER — Telehealth: Payer: Self-pay | Admitting: *Deleted

## 2023-12-31 NOTE — Telephone Encounter (Signed)
 [facility]    Message Received: Today Young, [place], Marrell Dicaprio L, LPN This patient's daughter called and needs to reschedule due to daughter having Covid.  Please call to reschedule her.  I am placing the case in the depot.  Thanks,

## 2023-12-31 NOTE — Telephone Encounter (Signed)
 Pt has been rescheduled for 01/14/24. Updated instructions sent via mychart.

## 2023-12-31 NOTE — Telephone Encounter (Signed)
 Pt has been rescheduled for 01/14/24. Updated instructions sent via mychart. Pt's daughter Marily Shows tested positive for COVID on Saturday morning and says pt isn't having any symptoms at this time. She spoke with pt's PCP and he says it was ok to push procedure out a couple of weeks.

## 2024-01-07 ENCOUNTER — Encounter: Payer: Self-pay | Admitting: Family Medicine

## 2024-01-08 NOTE — Telephone Encounter (Signed)
 Spoke to pts daughter Marily Shows (on dpr) and she says pt is wishing to cancel her procedure on 01/14/24. She says is wanting her mom to talk with Dr.Carver about not wanting to do the procedure so she wanted to set up and appt with Dr.Carver. Call transferred to front desk to schedule appointment

## 2024-01-08 NOTE — Telephone Encounter (Signed)
 noted

## 2024-01-08 NOTE — Telephone Encounter (Signed)
 Nurses I certainly understand their point of view More than willing to meet with her and the daughter Please assist with giving her one of my open slots either Wednesday Thursday Friday Monday or Tuesday thank you

## 2024-01-08 NOTE — Telephone Encounter (Signed)
 LMTRC

## 2024-01-09 ENCOUNTER — Encounter: Payer: Self-pay | Admitting: *Deleted

## 2024-01-10 ENCOUNTER — Ambulatory Visit: Admitting: Family Medicine

## 2024-01-10 ENCOUNTER — Telehealth: Payer: Self-pay | Admitting: *Deleted

## 2024-01-10 ENCOUNTER — Other Ambulatory Visit (HOSPITAL_COMMUNITY)

## 2024-01-10 ENCOUNTER — Encounter: Payer: Self-pay | Admitting: Family Medicine

## 2024-01-10 ENCOUNTER — Encounter (HOSPITAL_COMMUNITY)

## 2024-01-10 VITALS — BP 120/79 | HR 82 | Temp 97.2°F | Ht 64.0 in | Wt 137.6 lb

## 2024-01-10 DIAGNOSIS — I4891 Unspecified atrial fibrillation: Secondary | ICD-10-CM | POA: Diagnosis not present

## 2024-01-10 DIAGNOSIS — K625 Hemorrhage of anus and rectum: Secondary | ICD-10-CM

## 2024-01-10 NOTE — Progress Notes (Signed)
 Subjective:    Patient ID: Julie Swanson, female    DOB: 04-15-1928, 89 y.o.   MRN: [id number]  HPI GI concerns no bleeding or abdomen pain currently  , DNR and Hospice  Discussed the use of AI scribe software for clinical note transcription with the patient, who gave verbal consent to proceed.  History of Present Illness   Julie Swanson is a 89 year old female who presents for discussion regarding her decision to decline further diagnostic procedures and treatments.  She has previously undergone a CT scan that showed abnormal areas in the colon. A colonoscopy was recommended for further evaluation, but she has decided against it, citing her age and previous experience with the procedure. She does not want interventions such as chemotherapy, radiation, or surgery, preferring to focus on her current quality of life.  She has no current symptoms such as blood in her bowel movements, abdominal pain, or dietary issues. She does not wake up at night with abdominal pain and feels she is doing well overall. Her bowel movements have returned to normal, and she is not experiencing any bleeding. She was on a blood thinner, which was temporarily stopped due to bleeding concerns but has since been restarted after resolution of the bleeding.  Her family has noted some weight loss, which they attribute to a gradual decline rather than any acute issue. She maintains a positive outlook, focusing on day-to-day living and relying on her faith to carry her through challenges. She has a living will and medical power of attorney in place, indicating her wishes for no aggressive interventions.  She also has a long-standing issue with an itchy scalp and neck, which has persisted for years. She has tried various treatments, including medicated shampoos, with limited relief.       Review of Systems     Objective:   Physical Exam General-in no acute distress Eyes-no discharge Lungs-respiratory rate normal,  CTA CV-no murmurs,RRR Extremities skin warm dry no edema Neuro grossly normal Behavior normal, alert        Assessment & Plan:  Assessment and Plan    Blood in stool Intermittent blood in stool with CT scan showing abnormal colon areas, raising suspicion for colon cancer. She declined further invasive diagnostics and treatments, opting for symptomatic management. - Coordinate with cardiology to discuss reinitiating anticoagulation at 2.5 mg twice daily. - Monitor for significant bleeding or bowel obstruction symptoms. - Educated on bowel obstruction symptoms and advised seeking emergency care if she occurs. - Discuss potential for hospice care if condition declines.  Possible colon cancer CT scan suggests possible colon cancer, but diagnosis unconfirmed due to declined colonoscopy. She is aware of risks and prefers comfort-focused care. - Support her decision to decline further interventions. - Educate on cancer progression symptoms and when to seek medical attention. - Discuss potential for hospice care if condition declines.  Itchy scalp Chronic itchy scalp with limited relief from previous treatments. - Recommend Selsun Blue shampoo, apply to damp scalp, leave for 10-15 minutes, rinse, twice weekly. - Advise frequent use of lotion on itchy areas.  Goals of Care Prefers no invasive interventions, focusing on quality of life. Living will and medical power of attorney in place, considering DNR order. - Ensure living will and medical power of attorney documents are up to date and accessible. - Discuss and complete DNR order to reflect her end-of-life care wishes.      Connect with gastroenterologist regarding patient's decision To connect with cardiologist  regarding dosing of Eliquis 

## 2024-01-10 NOTE — Telephone Encounter (Signed)
 Spoke with daughter Marily Shows and explained Dr. Mordechai April was working patient in on his hospital day and if we can move appointment down to 3:00pm. She was agreeable. Appointment changed.

## 2024-01-11 ENCOUNTER — Ambulatory Visit: Admitting: Family Medicine

## 2024-01-13 ENCOUNTER — Telehealth: Payer: Self-pay | Admitting: Family Medicine

## 2024-01-13 NOTE — Telephone Encounter (Signed)
-----   Message from Peder Bourdon sent at 01/10/2024  9:50 PM EDT ----- [name],  She is actually not far from the cuf-off for 2.5 mg bid with age of 62 and weight not much above 60 kg.  With the risk of GI bleeding and palliative approach, think it would be reasonable to decrease Eliquis  to 2.5 mg bid.  Dalton ----- Message ----- From: Bennet Brasil, MD Sent: 01/10/2024   8:43 PM EDT To: Darlis Eisenmenger, MD  Hi Dalton-this patient recently had some lower abdominal pain with small amounts of blood mixed in with her stool.  CAT scan revealed the possibility of a colon cancer.  Patient after long discussion defers on any type of workup.  She has about papers supporting a more palliative approach.  She desires to continue her anticoagulant.  She has not had any heavy bleeding in since restarting her Eliquis  7 days ago has not had any bleeding.  My question-would you recommend sticking with 5 mg twice daily or adjusting to 2.5 mg twice daily.  Patient is aware of what to do should she encounter a more significant bleed.  I appreciate your input-[facility]

## 2024-01-13 NOTE — Telephone Encounter (Signed)
 Nurses-please communicate to the daughter Please let the patient know that I did communicate with her cardiologist regarding her situation He recommended reducing the Eliquis  to lessen the risk of accidental bleeding Recommends 2.5 mg 1 twice daily, #60, 5 refills Stop 5 mg dosing See documentation below from the cardiologist If any questions let me know  Thanks-Dr. [name]  ---- Message from Peder Bourdon sent at 01/10/2024  9:50 PM EDT ----- [name],  She is actually not far from the cuf-off for 2.5 mg bid with age of 37 and weight not much above 60 kg.  With the risk of GI bleeding and palliative approach, think it would be reasonable to decrease Eliquis  to 2.5 mg bid.  [place] ----- Message -----

## 2024-01-14 ENCOUNTER — Ambulatory Visit (HOSPITAL_COMMUNITY): Admission: RE | Admit: 2024-01-14 | Source: Home / Self Care | Admitting: Internal Medicine

## 2024-01-14 ENCOUNTER — Encounter (HOSPITAL_COMMUNITY): Admission: RE | Payer: Self-pay | Source: Home / Self Care

## 2024-01-14 SURGERY — COLONOSCOPY
Anesthesia: Choice

## 2024-01-14 MED ORDER — APIXABAN 2.5 MG PO TABS: 2.5000 mg | ORAL_TABLET | Freq: Two times a day (BID) | ORAL | 5 refills | Status: DC

## 2024-01-14 NOTE — Telephone Encounter (Signed)
 Prescription sent electronically to pharmacy. Daughter [name](DPR) notified and verbalized understanding.

## 2024-01-15 NOTE — Telephone Encounter (Signed)
 Spoke with daughter Marily Shows. Appt moved up to 1pm on 5/7. Aware main st location

## 2024-01-16 ENCOUNTER — Ambulatory Visit (INDEPENDENT_AMBULATORY_CARE_PROVIDER_SITE_OTHER): Admitting: Internal Medicine

## 2024-01-16 ENCOUNTER — Telehealth: Payer: Self-pay | Admitting: Family Medicine

## 2024-01-16 ENCOUNTER — Encounter: Payer: Self-pay | Admitting: Internal Medicine

## 2024-01-16 VITALS — BP 122/61 | HR 89 | Temp 97.8°F | Ht 64.0 in | Wt 144.1 lb

## 2024-01-16 DIAGNOSIS — R634 Abnormal weight loss: Secondary | ICD-10-CM

## 2024-01-16 DIAGNOSIS — K625 Hemorrhage of anus and rectum: Secondary | ICD-10-CM | POA: Diagnosis not present

## 2024-01-16 DIAGNOSIS — Z7901 Long term (current) use of anticoagulants: Secondary | ICD-10-CM

## 2024-01-16 DIAGNOSIS — R935 Abnormal findings on diagnostic imaging of other abdominal regions, including retroperitoneum: Secondary | ICD-10-CM | POA: Diagnosis not present

## 2024-01-16 DIAGNOSIS — R109 Unspecified abdominal pain: Secondary | ICD-10-CM | POA: Diagnosis not present

## 2024-01-16 DIAGNOSIS — R630 Anorexia: Secondary | ICD-10-CM

## 2024-01-16 DIAGNOSIS — R933 Abnormal findings on diagnostic imaging of other parts of digestive tract: Secondary | ICD-10-CM

## 2024-01-16 NOTE — Telephone Encounter (Signed)
 Daughter cam by to pick up DNR for patient but not ready. Put one in your yellow folder to complete. Also she is wanted to know if she needs to keep August appointment. Daughter moved up patient appointment to 6/4/ for 4 month follow up through my she was just seen 01/10/24. Please advise

## 2024-01-16 NOTE — Telephone Encounter (Signed)
 Please reestablish appointment in August for this patient thank you

## 2024-01-16 NOTE — Progress Notes (Signed)
 Primary Care Physician:  Bennet Brasil, MD Primary Gastroenterologist:  Dr. Mordechai April  Chief Complaint  Patient presents with   Follow-up    Patient here today to discuss her need for a Tcs. The patient does not want to proceed with the procedure. They need advise.    HPI:   Julie Swanson is a 89 y.o. female who presents to clinic today for follow up visit..  She is accompanied by her daughter.  Initially seen 12/27/2023 as a new patient. Patient states she was in her normal state of health until beginning of April 2025 when she had sudden onset of abdominal pain, primarily periumbilical, severe, tender to palpation.  Also had rectal bleeding.  She stopped her Eliquis  for a few days.  She underwent urgent CT imaging which I personally reviewed which showed thickening of her mid to distal transverse colon.  Patient's last colonoscopy 10 years ago which she states was WNL.  Reports family history of colon cancer in her brother.  States her bleeding has stopped.  Abdominal pain has resolved.    Does note chronic changes of poor appetite as well as weight loss of approximately 4 to 5 pounds.  She was scheduled for colonoscopy after previous visit but decided to cancel this.  Today, wish to discuss further.  Patient states that she thought about it would now like to pursue any invasive testing including colonoscopy.  She states she is in [employer].  Past Medical History:  Diagnosis Date   Advance directive in chart 06/28/2022   Patient has advanced directive that was scanned into the system.  This advanced directive indicates to withdraw and withhold life-prolonging measures if it is felt that she has an incurable or irreversible condition, unconscious and with high degree of medical uncertainty will never regain consciousness or suffer from advanced dementia.  Advanced directive scanned into the system   Arthritis    Atrial fibrillation (HCC)    CAD (coronary artery disease)     Chest pain    CHF (congestive heart failure) (HCC)    Complication of anesthesia    pt states "hard to wake up"   HLD (hyperlipidemia)    HTN (hypertension)    Hyperkalemia    Macular degeneration    MVP (mitral valve prolapse)    Osteoporosis    Palpitations     Past Surgical History:  Procedure Laterality Date   ABDOMINAL HYSTERECTOMY     BREAST EXCISIONAL BIOPSY Left    BREAST EXCISIONAL BIOPSY Right    CARDIOVERSION N/A 05/13/2015   Procedure: CARDIOVERSION;  Surgeon: Darlis Eisenmenger, MD;  Location: [facility] ENDOSCOPY;  Service: Cardiovascular;  Laterality: N/A;   CARDIOVERSION N/A 08/13/2015   Procedure: CARDIOVERSION;  Surgeon: Darlis Eisenmenger, MD;  Location: [facility] ENDOSCOPY;  Service: Cardiovascular;  Laterality: N/A;   CARDIOVERSION N/A 08/31/2015   Procedure: CARDIOVERSION;  Surgeon: Darlis Eisenmenger, MD;  Location: [facility] ENDOSCOPY;  Service: Cardiovascular;  Laterality: N/A;   CATARACT EXTRACTION     BOTH EYES   COLONOSCOPY     COLONOSCOPY N/A 08/14/2014   Procedure: COLONOSCOPY;  Surgeon: Ruby Corporal, MD;  Location: [facility] ENDO SUITE;  Service: Endoscopy;  Laterality: N/A;  225   CYSTECTOMY     BREAST   hysterectomy - unknown type     TOOTH EXTRACTION Left 12/30/2020    Current Outpatient Medications  Medication Sig Dispense Refill   acetaminophen (TYLENOL) 500 MG tablet Take 500 mg by mouth 2 (two) times  daily as needed for moderate pain.     apixaban  (ELIQUIS ) 2.5 MG TABS tablet Take 1 tablet (2.5 mg total) by mouth 2 (two) times daily. (Patient not taking: Reported on 01/16/2024) 60 tablet 5   atorvastatin  (LIPITOR) 10 MG tablet TAKE (1) TABLET BY MOUTH ONCE DAILY. 90 tablet 3   blood glucose meter kit and supplies Dispense based on patient and insurance preference. Use to check sugars once daily Dx E11.9 1 each 0   cycloSPORINE (RESTASIS) 0.05 % ophthalmic emulsion Place 1 drop into both eyes 2 (two) times daily.     diltiazem  (CARDIZEM  CD) 240 MG 24 hr capsule Take 1 capsule  (240 mg total) by mouth daily. 90 capsule 3   furosemide  (LASIX ) 40 MG tablet Take 2 tablets (80 mg total) by mouth 2 (two) times daily. 360 tablet 3   glucose blood (ONETOUCH ULTRA) test strip Use as instructed 100 strip 2   Lancets (ONETOUCH DELICA PLUS LANCET33G) MISC USE TO TEST BLOOD SUGAR ONCE A DAY 100 each 1   linagliptin  (TRADJENTA ) 5 MG TABS tablet Take 1 tablet (5 mg total) by mouth daily. 90 tablet 0   Lutein 20 MG CAPS Take 1 capsule by mouth daily.     metoprolol  succinate (TOPROL -XL) 50 MG 24 hr tablet TAKE (1) TABLET BY MOUTH TWICE DAILY. 180 tablet 3   OVER THE COUNTER MEDICATION Med Name: BENE-FIBER Take two (2) teaspoons by mouth daily with 8 oz water .     potassium chloride  SA (KLOR-CON  M) 20 MEQ tablet Take 2 tablets (40 mEq total) by mouth daily. 180 tablet 3   Probiotic Product (PROBIOTIC ADVANCED PO) Take by mouth. Once per day     No current facility-administered medications for this visit.    Allergies as of 01/16/2024 - Review Complete 01/16/2024  Allergen Reaction Noted   Valtrex [valacyclovir hcl] Palpitations 06/17/2012   Antihistamines, chlorpheniramine-type Other (See Comments) 01/05/2011   Evista [raloxifene] Other (See Comments) 04/27/2009   Feldene [piroxicam] Other (See Comments) 06/29/2009   Januvia  [sitagliptin ]  06/09/2021   Other  05/21/2018   Tropicamide  06/18/2012   Alpha-gal  02/15/2022    Family History  Problem Relation Age of Onset   Heart disease Mother    Stroke Mother        ane melanoma   Heart disease Father    Bladder Cancer Father    Cancer Brother    CAD Brother        ALL   Stroke Other    Heart attack Other    CAD Sister        X2    Social History   Socioeconomic History   Marital status: Widowed    Spouse name: Not on file   Number of children: 2   Years of education: Not on file   Highest education level: Not on file  Occupational History   Not on file  Tobacco Use   Smoking status: Never   Smokeless  tobacco: Never  Vaping Use   Vaping status: Never Used  Substance and Sexual Activity   Alcohol use: No   Drug use: No   Sexual activity: Not on file  Other Topics Concern   Not on file  Social History Narrative   2 daughter, Lenon Radar and Marily Shows.   1 grandson   1 step grandson   Social Drivers of Corporate investment banker Strain: Low Risk  (12/22/2022)   Overall Financial Resource Strain (CARDIA)  Difficulty of Paying Living Expenses: Not hard at all  Food Insecurity: No Food Insecurity (12/22/2022)   Hunger Vital Sign    Worried About Running Out of Food in the Last Year: Never true    Ran Out of Food in the Last Year: Never true  Transportation Needs: No Transportation Needs (12/22/2022)   PRAPARE - Administrator, Civil Service (Medical): No    Lack of Transportation (Non-Medical): No  Physical Activity: Insufficiently Active (12/22/2022)   Exercise Vital Sign    Days of Exercise per Week: 4 days    Minutes of Exercise per Session: 10 min  Stress: No Stress Concern Present (12/22/2022)   [employer] of Occupational Health - Occupational Stress Questionnaire    Feeling of Stress : Not at all  Social Connections: Moderately Isolated (12/22/2022)   Social Connection and Isolation Panel [NHANES]    Frequency of Communication with Friends and Family: More than three times a week    Frequency of Social Gatherings with Friends and Family: Three times a week    Attends Religious Services: 1 to 4 times per year    Active Member of Clubs or Organizations: No    Attends Banker Meetings: Never    Marital Status: Widowed  Intimate Partner Violence: Not At Risk (12/22/2022)   Humiliation, Afraid, Rape, and Kick questionnaire    Fear of Current or Ex-Partner: No    Emotionally Abused: No    Physically Abused: No    Sexually Abused: No    Subjective: Review of Systems  Constitutional:  Negative for chills and fever.  HENT:  Negative for congestion and  hearing loss.   Eyes:  Negative for blurred vision and double vision.  Respiratory:  Negative for cough and shortness of breath.   Cardiovascular:  Negative for chest pain and palpitations.  Gastrointestinal:  Negative for abdominal pain, blood in stool, constipation, diarrhea, heartburn, melena and vomiting.  Genitourinary:  Negative for dysuria and urgency.  Musculoskeletal:  Negative for joint pain and myalgias.  Skin:  Negative for itching and rash.  Neurological:  Negative for dizziness and headaches.  Psychiatric/Behavioral:  Negative for depression. The patient is not nervous/anxious.        Objective: BP 122/61 (BP Location: Left Arm, Patient Position: Sitting, Cuff Size: Normal)   Pulse 89   Temp 97.8 F (36.6 C) (Temporal)   Ht 5\' 4"  (1.626 m)   Wt 144 lb 1.6 oz (65.4 kg)   BMI 24.73 kg/m  Physical Exam Constitutional:      Appearance: Normal appearance.  HENT:     Head: Normocephalic and atraumatic.  Eyes:     Extraocular Movements: Extraocular movements intact.     Conjunctiva/sclera: Conjunctivae normal.  Cardiovascular:     Rate and Rhythm: Normal rate and regular rhythm.  Pulmonary:     Effort: Pulmonary effort is normal.     Breath sounds: Normal breath sounds.  Abdominal:     General: Bowel sounds are normal.     Palpations: Abdomen is soft.  Musculoskeletal:        General: No swelling. Normal range of motion.     Cervical back: Normal range of motion and neck supple.  Skin:    General: Skin is warm and dry.     Coloration: Skin is not jaundiced.  Neurological:     General: No focal deficit present.     Mental Status: She is alert and oriented to person, place, and time.  Psychiatric:        Mood and Affect: Mood normal.        Behavior: Behavior normal.      Assessment: *Rectal bleeding *Abdominal Pain *Abnormal CT scan abdomen  *Chronic anticoagulation with Eliquis  *Weight loss *Poor appetite  Plan: I again discussed CT findings in  depth with patient and daughter today.  Showed them images.  I am concerned for underlying colon malignancy.  After thinking further, patient would not like to pursue colonoscopy.  I discussed possible ramifications of underlying colon malignancy including obstruction, perforation, rectal bleeding, metastases.  Patient understands.  If she has evidence of further bleeding in the future, would recommend discontinuing her Eliquis  altogether.  If she develops symptoms of constipation would recommend starting MiraLAX 1-2 capfuls daily.  Palliative and/or hospice care will likely be beneficial in the future though may be difficult to get approved without a firm diagnosis.  Discussed with them both today regarding this as well.  Recommend DNR status.  I counseled patient and daughter today if they change their mind and would like to pursue colonoscopy, to call our office and we can arrange.  If she develops new GI symptoms in the future and would like to schedule an office visit, I would be happy to see her again.  Otherwise I think she can follow-up as needed.   01/16/2024 1:08 PM   Disclaimer: This note was dictated with voice recognition software. Similar sounding words can inadvertently be transcribed and may not be corrected upon review.

## 2024-01-16 NOTE — Patient Instructions (Signed)
 If you start to have issues with constipation then I want you start taking MiraLAX 1-2 capfuls daily.  If you have evidence of further bleeding, would recommend you stop your Eliquis  altogether.  If you have new GI concerns that you want to discuss, or would like to revisit the idea of a possible colonoscopy then let me know.  Otherwise I think you can follow-up as needed.  It was very nice seeing both of you again today.  Dr. Mordechai April

## 2024-01-17 ENCOUNTER — Encounter (HOSPITAL_COMMUNITY): Payer: Self-pay | Admitting: Cardiology

## 2024-01-17 NOTE — Telephone Encounter (Signed)
 I filled out the form Thank you for sending it to me Definitely keep appointment in June When we see her in June we can always schedule the next follow-up visit based on how she is doing If family truly wants to be seen in August I am fine with that but when we see her in June we may decide to follow her up in 6 weeks or 2 months or 10 weeks depending on how things are going And as always we can help accommodate her needs in between if something comes up

## 2024-01-17 NOTE — Telephone Encounter (Signed)
@  Dr Mitzie Anda Daughter would like to confirm decreased eliquis  dosage change is required. Would like to confirm the risk for stroke is not heavily decreased by med change. Please advise

## 2024-02-05 ENCOUNTER — Ambulatory Visit (INDEPENDENT_AMBULATORY_CARE_PROVIDER_SITE_OTHER): Admitting: Family Medicine

## 2024-02-05 ENCOUNTER — Ambulatory Visit: Payer: Self-pay | Admitting: Family Medicine

## 2024-02-05 ENCOUNTER — Other Ambulatory Visit (HOSPITAL_COMMUNITY)
Admission: RE | Admit: 2024-02-05 | Discharge: 2024-02-05 | Disposition: A | Discharge status: Home / Self Care | Source: Ambulatory Visit | Attending: Family Medicine | Admitting: Family Medicine

## 2024-02-05 ENCOUNTER — Ambulatory Visit: Admitting: Family Medicine

## 2024-02-05 ENCOUNTER — Ambulatory Visit (HOSPITAL_COMMUNITY)
Admission: RE | Admit: 2024-02-05 | Discharge: 2024-02-05 | Disposition: A | Discharge status: Home / Self Care | Source: Ambulatory Visit | Attending: Family Medicine | Admitting: Family Medicine

## 2024-02-05 VITALS — BP 121/69 | HR 96 | Temp 97.7°F

## 2024-02-05 DIAGNOSIS — G9340 Encephalopathy, unspecified: Secondary | ICD-10-CM | POA: Insufficient documentation

## 2024-02-05 DIAGNOSIS — W19XXXA Unspecified fall, initial encounter: Secondary | ICD-10-CM

## 2024-02-05 DIAGNOSIS — R4189 Other symptoms and signs involving cognitive functions and awareness: Secondary | ICD-10-CM | POA: Insufficient documentation

## 2024-02-05 DIAGNOSIS — I6782 Cerebral ischemia: Secondary | ICD-10-CM | POA: Diagnosis not present

## 2024-02-05 DIAGNOSIS — R4182 Altered mental status, unspecified: Secondary | ICD-10-CM | POA: Diagnosis not present

## 2024-02-05 DIAGNOSIS — I672 Cerebral atherosclerosis: Secondary | ICD-10-CM | POA: Diagnosis not present

## 2024-02-05 DIAGNOSIS — D649 Anemia, unspecified: Secondary | ICD-10-CM | POA: Diagnosis not present

## 2024-02-05 DIAGNOSIS — E1165 Type 2 diabetes mellitus with hyperglycemia: Secondary | ICD-10-CM | POA: Insufficient documentation

## 2024-02-05 LAB — CBC
HCT: 33.6 % — ABNORMAL LOW (ref 36.0–46.0)
Hemoglobin: 11.4 g/dL — ABNORMAL LOW (ref 12.0–15.0)
MCH: 30.4 pg (ref 26.0–34.0)
MCHC: 33.9 g/dL (ref 30.0–36.0)
MCV: 89.6 fL (ref 80.0–100.0)
Platelets: 237 10*3/uL (ref 150–400)
RBC: 3.75 MIL/uL — ABNORMAL LOW (ref 3.87–5.11)
RDW: 14.2 % (ref 11.5–15.5)
WBC: 9.5 10*3/uL (ref 4.0–10.5)
nRBC: 0 % (ref 0.0–0.2)

## 2024-02-05 LAB — COMPREHENSIVE METABOLIC PANEL WITH GFR
ALT: 27 U/L (ref 0–44)
AST: 43 U/L — ABNORMAL HIGH (ref 15–41)
Albumin: 3 g/dL — ABNORMAL LOW (ref 3.5–5.0)
Alkaline Phosphatase: 106 U/L (ref 38–126)
Anion gap: 12 (ref 5–15)
BUN: 15 mg/dL (ref 8–23)
CO2: 24 mmol/L (ref 22–32)
Calcium: 8.3 mg/dL — ABNORMAL LOW (ref 8.9–10.3)
Chloride: 94 mmol/L — ABNORMAL LOW (ref 98–111)
Creatinine, Ser: 0.86 mg/dL (ref 0.44–1.00)
GFR, Estimated: >60 mL/min (ref >60)
Glucose, Bld: 195 mg/dL — ABNORMAL HIGH (ref 70–99)
Potassium: 3.9 mmol/L (ref 3.5–5.1)
Sodium: 130 mmol/L — ABNORMAL LOW (ref 135–145)
Total Bilirubin: 1.5 mg/dL — ABNORMAL HIGH (ref 0.0–1.2)
Total Protein: 6.7 g/dL (ref 6.5–8.1)

## 2024-02-05 LAB — POCT URINALYSIS DIPSTICK
Spec Grav, UA: 1.015 (ref 1.010–1.025)
pH, UA: 6 (ref 5.0–8.0)

## 2024-02-05 LAB — CK: Total CK: 42 U/L (ref 38–234)

## 2024-02-05 MED ORDER — APIXABAN 2.5 MG PO TABS: 2.5000 mg | ORAL_TABLET | Freq: Two times a day (BID) | ORAL | 3 refills | Status: DC

## 2024-02-05 NOTE — Progress Notes (Signed)
 Subjective:  Patient ID: Julie Swanson, female    DOB: 11-19-1927  Age: 89 y.o. MRN: [id number]  CC:  Fall, confusion   HPI:  89 year old female with suspected colon cancer (has refused colonoscopy) presents for evaluation after suffering a fall.  Daughter is the primary historian.  She had an unwitnessed fall Saturday.  Patient is unsure of how she fell.  Daughter found her down but she seemed to be okay.  Paramedics helped get her up and she had stable vital signs.  Since that time, daughter reports that she has been eating and drinking but requiring help and not following commands.  She normally takes care of herself in her household except for the cleaning.  She is now requiring regular supervision and seems to not like herself.  Daughter states that she has noticed some bruising but she has not noticed any other injuries.  She is having difficulty ambulating as well.  There is potential concern for urinary tract infection.  No reported symptoms by the patient.  Blood sugar has been elevated.  Daughter reports that she is having difficulty transporting her as she is basically nonambulatory at this time.  She is requesting help at home.  Patient Active Problem List   Diagnosis Date Noted   Type 2 diabetes mellitus with hyperglycemia (HCC) 02/05/2024   Encephalopathy 02/05/2024   Advance directive in chart 06/28/2022   Chronic diastolic heart failure (HCC) 05/29/2017   Aortic atherosclerosis (HCC) 06/30/2016   Pancreatic cyst 05/05/2016   PAD (peripheral artery disease) (HCC) 03/09/2016   COPD (chronic obstructive pulmonary disease) (HCC) 08/09/2015   Other hemorrhoids 05/24/2015   Rectal bleeding 11/24/2014   Hyperlipidemia 06/19/2014   Atrial fibrillation (HCC) 08/11/2013   Macular degeneration of right eye 07/09/2013   Osteoporosis 07/09/2013   Cervical arthritis 07/09/2013   HYPERTENSION, BENIGN 04/27/2009    Social Hx   Social History   Socioeconomic History   Marital  status: Widowed    Spouse name: Not on file   Number of children: 2   Years of education: Not on file   Highest education level: Not on file  Occupational History   Not on file  Tobacco Use   Smoking status: Never   Smokeless tobacco: Never  Vaping Use   Vaping status: Never Used  Substance and Sexual Activity   Alcohol use: No   Drug use: No   Sexual activity: Not on file  Other Topics Concern   Not on file  Social History Narrative   2 daughter, Lenon Radar and Marily Shows.   1 grandson   1 step grandson   Social Drivers of Corporate investment banker Strain: Low Risk  (12/22/2022)   Overall Financial Resource Strain (CARDIA)    Difficulty of Paying Living Expenses: Not hard at all  Food Insecurity: No Food Insecurity (12/22/2022)   Hunger Vital Sign    Worried About Running Out of Food in the Last Year: Never true    Ran Out of Food in the Last Year: Never true  Transportation Needs: No Transportation Needs (12/22/2022)   PRAPARE - Administrator, Civil Service (Medical): No    Lack of Transportation (Non-Medical): No  Physical Activity: Insufficiently Active (12/22/2022)   Exercise Vital Sign    Days of Exercise per Week: 4 days    Minutes of Exercise per Session: 10 min  Stress: No Stress Concern Present (12/22/2022)   [employer] of Occupational Health - Occupational Stress Questionnaire  Feeling of Stress : Not at all  Social Connections: Moderately Isolated (12/22/2022)   Social Connection and Isolation Panel [NHANES]    Frequency of Communication with Friends and Family: More than three times a week    Frequency of Social Gatherings with Friends and Family: Three times a week    Attends Religious Services: 1 to 4 times per year    Active Member of Clubs or Organizations: No    Attends Banker Meetings: Never    Marital Status: Widowed    Review of Systems Per HPI  Objective:  BP 121/69   Pulse 96   Temp 97.7 F (36.5 C)   SpO2 95%       02/05/2024   11:46 AM 01/16/2024    1:06 PM 01/10/2024   11:29 AM  BP/Weight  Systolic BP 121 122 120  Diastolic BP 69 61 79  Wt. (Lbs)  144.1 137.6  BMI  24.73 kg/m2 23.62 kg/m2    Physical Exam Vitals and nursing note reviewed.  Constitutional:      General: She is not in acute distress.    Appearance: Normal appearance.  HENT:     Head: Normocephalic and atraumatic.  Cardiovascular:     Rate and Rhythm: Normal rate and regular rhythm.  Pulmonary:     Effort: Pulmonary effort is normal.     Breath sounds: Normal breath sounds. No wheezing, rhonchi or rales.  Skin:    Comments: Bruising noted to the upper back.  Neurological:     Mental Status: She is alert.     Comments: Patient is alert and is oriented to person and place.  She is not fully oriented to time.  She knows the month but but not the day or year.     Lab Results  Component Value Date   WBC 7.0 12/17/2023   HGB 11.9 (L) 12/17/2023   HCT 37.5 12/17/2023   PLT 246 12/17/2023   GLUCOSE 209 (H) 12/17/2023   CHOL 117 06/11/2023   TRIG 141 06/11/2023   HDL 30 (L) 06/11/2023   LDLCALC 62 06/11/2023   ALT 11 12/17/2023   AST 17 12/17/2023   NA 132 (L) 12/17/2023   K 3.9 12/17/2023   CL 96 (L) 12/17/2023   CREATININE 0.92 12/17/2023   BUN 10 12/17/2023   CO2 25 12/17/2023   TSH 3.147 09/08/2020   INR 1.0 04/28/2009   HGBA1C 8.5 (H) 12/13/2023     Assessment & Plan:  Encephalopathy, unspecified type Assessment & Plan: Etiology and prognosis unclear at time. Urine clear.  Sending culture. Obtaining head CT given recent fall.  Obtaining labs for evaluation electrolyte disturbance and also to assess CK as she was down for a period of time. Family requesting assistance at home given the fact that she is confused and is having difficulty ambulating.  Placing referral to home health.  Orders: -     CT HEAD WO CONTRAST ( ) -     CK -     COMPLETE METABOLIC PANEL WITHOUT GFR -     Urine Culture -      Ambulatory referral to Home Health  Fall, initial encounter -     CT HEAD WO CONTRAST ( ) -     CK -     POCT urinalysis dipstick -     Ambulatory referral to Home Health  Anemia, unspecified type -     CBC   Follow-up:  Pending results  30 minutes were spent during  this encounter taking history, examining patient, arranging CT and laboratory studies, discussing issues and concerns with the family, and reviewing patient's medical history and recent visit with Dr. Fairy Homer as well as Dr. Mordechai April in addition to recent CT scan results.  Kathleen Papa DO [facility]

## 2024-02-05 NOTE — Assessment & Plan Note (Signed)
 Etiology and prognosis unclear at time. Urine clear.  Sending culture. Obtaining head CT given recent fall.  Obtaining labs for evaluation electrolyte disturbance and also to assess CK as she was down for a period of time. Family requesting assistance at home given the fact that she is confused and is having difficulty ambulating.  Placing referral to home health.

## 2024-02-05 NOTE — Telephone Encounter (Signed)
 Appointment scheduled as requested

## 2024-02-05 NOTE — Telephone Encounter (Signed)
Duplicate- see other message

## 2024-02-06 ENCOUNTER — Ambulatory Visit: Payer: Self-pay | Admitting: Family Medicine

## 2024-02-06 ENCOUNTER — Telehealth: Payer: Self-pay | Admitting: Family Medicine

## 2024-02-06 NOTE — Telephone Encounter (Signed)
 Nurses I had a phone call conversation with the daughter Marily Shows Patient was seen by Dr. Debrah Fan yesterday Patient has been gradually going downhill not as active as usual Not eating as well More confusion  After shared discussion with daughter-[name]-please refer patient to [facility] compassionate care-I believe the patient would benefit from chronic care management for dementia and declining physical strength It should be noted that the patient has an area on her colon that appears to be a cancer but she does not want to have a biopsy In my opinion the patient may not survive over the next 6 months and it would be reasonable to have [facility] do a consultation.  Patient is DNR and does not want any aggressive measures  Please call [facility] with this referral to try to expedite their consultation thank you

## 2024-02-06 NOTE — Telephone Encounter (Signed)
 Cook, Julie G, DO     02/06/24  8:38 AM I spoke with [name] this AM. He is going to call today. Results were essentially unrevealed with negative head CT.

## 2024-02-06 NOTE — Telephone Encounter (Signed)
 Referral placed to [place] at [facility]

## 2024-02-07 ENCOUNTER — Other Ambulatory Visit: Payer: Self-pay | Admitting: Family Medicine

## 2024-02-07 LAB — SPECIMEN STATUS REPORT

## 2024-02-07 LAB — URINE CULTURE

## 2024-02-11 DIAGNOSIS — I5032 Chronic diastolic (congestive) heart failure: Secondary | ICD-10-CM

## 2024-02-11 DIAGNOSIS — E785 Hyperlipidemia, unspecified: Secondary | ICD-10-CM

## 2024-02-11 DIAGNOSIS — H3530 Unspecified macular degeneration: Secondary | ICD-10-CM

## 2024-02-11 DIAGNOSIS — E119 Type 2 diabetes mellitus without complications: Secondary | ICD-10-CM | POA: Diagnosis not present

## 2024-02-11 DIAGNOSIS — C189 Malignant neoplasm of colon, unspecified: Secondary | ICD-10-CM | POA: Diagnosis not present

## 2024-02-11 DIAGNOSIS — G9341 Metabolic encephalopathy: Secondary | ICD-10-CM

## 2024-02-11 DIAGNOSIS — J449 Chronic obstructive pulmonary disease, unspecified: Secondary | ICD-10-CM | POA: Diagnosis not present

## 2024-02-11 DIAGNOSIS — M810 Age-related osteoporosis without current pathological fracture: Secondary | ICD-10-CM

## 2024-02-11 DIAGNOSIS — I4891 Unspecified atrial fibrillation: Secondary | ICD-10-CM | POA: Diagnosis not present

## 2024-02-11 DIAGNOSIS — I10 Essential (primary) hypertension: Secondary | ICD-10-CM

## 2024-02-12 ENCOUNTER — Telehealth: Payer: Self-pay

## 2024-02-12 NOTE — Telephone Encounter (Signed)
 Spoke with Marily Shows and she says she is agreeable to that time, anywhere from 10 am to 12 will be perfect.

## 2024-02-12 NOTE — Telephone Encounter (Signed)
 Copied from CRM [id number]. Topic: Appointments - Appointment Scheduling >> Feb 12, 2024  1:26 PM Santiya F wrote: Patient's daughter is calling in because patient can't make the appointment due to her being unable to travel. They are requesting a work in. Please follow up with patient's daughter. Daughter says the appointment is at Dr. Sherri Doffing request.

## 2024-02-12 NOTE — Telephone Encounter (Signed)
 Please talk with Julie Swanson Let her know that I can do a home visit on Thursday morning in the ballpark of 1030- 11 If that works for them I will just come to their house at that time thank you  Patient has significant health issues cannot come to the office

## 2024-02-13 ENCOUNTER — Ambulatory Visit: Admitting: Family Medicine

## 2024-02-15 NOTE — Telephone Encounter (Signed)
 Home visit was completed-patient overall doing well blood pressure 114/70 lungs clear heart regular her mentation is doing better her strength is fair but she is still fairly feeble she is eating fairly well she is under hospice care family has been talked with she is no longer on Eliquis  she is scheduled in July for an office visit but if she does not have the strength to come I will do a home visit

## 2024-02-21 ENCOUNTER — Encounter: Payer: Self-pay | Admitting: Family Medicine

## 2024-02-21 NOTE — Telephone Encounter (Signed)
 A letter has been dictated as requested by family Please connect with daughter Marily Shows Please print out this letter-I can sign it-please give to family thank you

## 2024-03-19 ENCOUNTER — Telehealth: Payer: Self-pay | Admitting: Family Medicine

## 2024-03-19 NOTE — Telephone Encounter (Signed)
 Form was faxed over from [facility] to be completed in your red folder.

## 2024-03-24 ENCOUNTER — Other Ambulatory Visit: Payer: Self-pay

## 2024-03-24 MED ORDER — LINAGLIPTIN 5 MG PO TABS: 5.0000 mg | ORAL_TABLET | Freq: Every day | ORAL | 0 refills | Status: DC

## 2024-03-25 ENCOUNTER — Encounter (HOSPITAL_COMMUNITY): Admitting: Cardiology

## 2024-03-25 ENCOUNTER — Other Ambulatory Visit: Payer: Self-pay | Admitting: Family Medicine

## 2024-03-25 ENCOUNTER — Ambulatory Visit: Admitting: Family Medicine

## 2024-03-25 MED ORDER — LINAGLIPTIN 5 MG PO TABS: 5.0000 mg | ORAL_TABLET | Freq: Every day | ORAL | 1 refills | Status: DC

## 2024-03-27 ENCOUNTER — Telehealth: Payer: Self-pay | Admitting: Family Medicine

## 2024-03-27 ENCOUNTER — Encounter: Payer: Self-pay | Admitting: Family Medicine

## 2024-03-27 NOTE — Telephone Encounter (Signed)
 This patient is having difficult time with ambulation.  Having to get assistance to move from the bed to a sitting chair.  He is at high risk of falling.  Can utilize a walker for a few steps but only with assistance Cannot prep her food Family has to prep for her Cannot take her medicines on schedule Has poor short-term memory along with intermittent confusion Requires family and caregiver to keep her safe  I did discuss her case with intake care manager with nursing home who stated that her current level of care would not meet their criteria for admitting a person  Insurance forms were filled out to the best of our ability

## 2024-03-28 ENCOUNTER — Telehealth: Payer: Self-pay | Admitting: Family Medicine

## 2024-03-28 NOTE — Telephone Encounter (Signed)
 Daughter Dagoberto dropped off form to be completed by provider in your red  folder in your box

## 2024-03-30 ENCOUNTER — Telehealth: Payer: Self-pay | Admitting: Family Medicine

## 2024-03-30 NOTE — Telephone Encounter (Signed)
 Forms regarding long-term care were filled out for the patient Based on documentation from her visit with Dr. Bluford as well as her visits with myself including a home visit patient does have significant cognitive dysfunction with inability to properly process.  She gets confused easily.  Her short-term memory is poor specifically worse since the fall.  Has significant ataxia and weakness.  Can take a few steps with a walker but needs someone to write there with her to prevent a fall.  Previously before they started having someone stay with her she tried to move about and fell to the floor and was found a period of time later and was once found at the toilet as well so it is clear that it is not safe for the patient to operate on her own nor is she capable of doing so  Caregiver assists her with food preparation as well as bathing, dressing, medication administration, transferring from the bed to the chair from the chair to the commode etc. patient has very limited mobility only a few steps with a walker and so off balance she needs an assistant with her.  I do not expect this condition to improve  It should be noted that the patient was having rectal bleeding early in May and a CAT scan showed a thickened area in the colon which could well represent colon cancer.  Patient has had some weight loss over the past year.  Based on all this hospice was consulted but hospice does not provide in-home care or aids.  The patient would be best served being taken care of at home but she needs custodial nursing home care but this can be provided at home with proper amount of help

## 2024-03-30 NOTE — Telephone Encounter (Signed)
 Form was filled out This was forwarded to the front in the red folder There are 2 forms Please scan both of them into epic Please connect with Dagoberto to give them the forms plus also the letter that was dictated back in June thanks-Dr. Glendia

## 2024-03-31 NOTE — Telephone Encounter (Signed)
 Call daughter to pick up forms she dropped off for patient

## 2024-04-04 ENCOUNTER — Ambulatory Visit

## 2024-04-07 ENCOUNTER — Encounter: Payer: Self-pay | Admitting: Family Medicine

## 2024-04-07 ENCOUNTER — Ambulatory Visit (INDEPENDENT_AMBULATORY_CARE_PROVIDER_SITE_OTHER): Admitting: Family Medicine

## 2024-04-07 VITALS — BP 124/70 | HR 83 | Temp 98.0°F | Ht 64.0 in | Wt 138.0 lb

## 2024-04-07 DIAGNOSIS — I4891 Unspecified atrial fibrillation: Secondary | ICD-10-CM | POA: Diagnosis not present

## 2024-04-07 MED ORDER — TRIAMCINOLONE ACETONIDE 0.1 % EX CREA: TOPICAL_CREAM | CUTANEOUS | 0 refills | Status: AC

## 2024-04-07 NOTE — Progress Notes (Signed)
   Subjective:    Patient ID: Julie Swanson, female    DOB: March 04, 1928, 89 y.o.   MRN: [id number]  HPI follow up  DM2 A-FIB Hyperlipidemia  This patient is on hospice care Family currently is helping her with today today living in complete including food preparation They have a CNA that comes in at 8 PM and states at 10 AM that helps her with medications as well as bathing and getting up multiple times at night to use the bathroom  patient also needs assistance with getting dressed  Sometimes patient is alert and oriented to what is going on sometimes she is confused this varies from day-to-day sometimes her energy level is good enough to use the walker for several steps other times they have to use a wheelchair  PMH Patient with previous thickness of the colon thought to be the possibility of a cancer patient deferred any type of workup and because of the rectal bleeding was felt to be hospice related situation patient not having rectal bleeding currently appetite fair     Review of Systems     Objective:   Physical Exam Patient is pleasant able to answer questions today has normal breathing and heart rate currently extremities trace edema abdomen soft but somewhat protruding       Assessment & Plan:   Patient currently under hospice care It is impossible to know how long she could possibly live Is reasonable to continue hospice care She is not capable of staying home by herself It is not possible for her to care for herself She needs someone to help manage her medications, help with food preparation bathing, help with bathroom, and help with ambulation Her strength and alertness vary from day today according to the patient and her daughter She has poor ambulation skills Surprisingly today she used a walker to get into the office but that was with maximal family help because of her unsteadiness in the exam room I recommended to the family that they use a wheelchair to get  her around and that she can use a walker for short spans of time at the home  Follow-up here in approximately 4 months continue current medication lab work potentially later this year

## 2024-04-18 ENCOUNTER — Ambulatory Visit: Admitting: Family Medicine

## 2024-04-25 ENCOUNTER — Encounter (HOSPITAL_COMMUNITY): Admitting: Cardiology

## 2024-05-01 NOTE — Telephone Encounter (Signed)
Appointment updated.

## 2024-05-02 ENCOUNTER — Telehealth: Payer: Self-pay | Admitting: Family Medicine

## 2024-05-02 ENCOUNTER — Other Ambulatory Visit: Payer: Self-pay

## 2024-05-02 ENCOUNTER — Ambulatory Visit (HOSPITAL_COMMUNITY)
Admission: RE | Admit: 2024-05-02 | Discharge: 2024-05-02 | Disposition: A | Discharge status: Home / Self Care | Source: Ambulatory Visit | Attending: Family Medicine | Admitting: Family Medicine

## 2024-05-02 ENCOUNTER — Other Ambulatory Visit (HOSPITAL_COMMUNITY)
Admission: RE | Admit: 2024-05-02 | Discharge: 2024-05-02 | Disposition: A | Discharge status: Home / Self Care | Source: Ambulatory Visit | Attending: Family Medicine | Admitting: Family Medicine

## 2024-05-02 ENCOUNTER — Ambulatory Visit (INDEPENDENT_AMBULATORY_CARE_PROVIDER_SITE_OTHER): Admitting: Family Medicine

## 2024-05-02 VITALS — BP 112/72 | HR 82 | Temp 97.9°F | Ht 64.0 in | Wt 140.2 lb

## 2024-05-02 DIAGNOSIS — R7989 Other specified abnormal findings of blood chemistry: Secondary | ICD-10-CM

## 2024-05-02 DIAGNOSIS — M79662 Pain in left lower leg: Secondary | ICD-10-CM

## 2024-05-02 DIAGNOSIS — R1033 Periumbilical pain: Secondary | ICD-10-CM

## 2024-05-02 DIAGNOSIS — M79661 Pain in right lower leg: Secondary | ICD-10-CM

## 2024-05-02 DIAGNOSIS — N63 Unspecified lump in unspecified breast: Secondary | ICD-10-CM

## 2024-05-02 DIAGNOSIS — M79669 Pain in unspecified lower leg: Secondary | ICD-10-CM

## 2024-05-02 LAB — CBC WITH DIFFERENTIAL/PLATELET
Abs Immature Granulocytes: 0.02 K/uL (ref 0.00–0.07)
Basophils Absolute: 0 K/uL (ref 0.0–0.1)
Basophils Relative: 1 %
Eosinophils Absolute: 0.2 K/uL (ref 0.0–0.5)
Eosinophils Relative: 2 %
HCT: 37.6 % (ref 36.0–46.0)
Hemoglobin: 12.2 g/dL (ref 12.0–15.0)
Immature Granulocytes: 0 %
Lymphocytes Relative: 15 %
Lymphs Abs: 1 K/uL (ref 0.7–4.0)
MCH: 29.5 pg (ref 26.0–34.0)
MCHC: 32.4 g/dL (ref 30.0–36.0)
MCV: 91 fL (ref 80.0–100.0)
Monocytes Absolute: 0.7 K/uL (ref 0.1–1.0)
Monocytes Relative: 10 %
Neutro Abs: 4.9 K/uL (ref 1.7–7.7)
Neutrophils Relative %: 72 %
Platelets: 213 K/uL (ref 150–400)
RBC: 4.13 MIL/uL (ref 3.87–5.11)
RDW: 14 % (ref 11.5–15.5)
WBC: 6.7 K/uL (ref 4.0–10.5)
nRBC: 0 % (ref 0.0–0.2)

## 2024-05-02 LAB — HEPATIC FUNCTION PANEL
ALT: 11 U/L (ref 0–44)
AST: 15 U/L (ref 15–41)
Albumin: 3.6 g/dL (ref 3.5–5.0)
Alkaline Phosphatase: 101 U/L (ref 38–126)
Bilirubin, Direct: 0.2 mg/dL (ref 0.0–0.2)
Indirect Bilirubin: 0.9 mg/dL (ref 0.3–0.9)
Total Bilirubin: 1.1 mg/dL (ref 0.0–1.2)
Total Protein: 6.8 g/dL (ref 6.5–8.1)

## 2024-05-02 LAB — BASIC METABOLIC PANEL WITH GFR
Anion gap: 8 (ref 5–15)
BUN: 15 mg/dL (ref 8–23)
CO2: 28 mmol/L (ref 22–32)
Calcium: 8.8 mg/dL — ABNORMAL LOW (ref 8.9–10.3)
Chloride: 99 mmol/L (ref 98–111)
Creatinine, Ser: 0.91 mg/dL (ref 0.44–1.00)
GFR, Estimated: 58 mL/min — ABNORMAL LOW (ref >60)
Glucose, Bld: 155 mg/dL — ABNORMAL HIGH (ref 70–99)
Potassium: 4.2 mmol/L (ref 3.5–5.1)
Sodium: 135 mmol/L (ref 135–145)

## 2024-05-02 LAB — D-DIMER, QUANTITATIVE: D-Dimer, Quant: 1.74 ug{FEU}/mL — ABNORMAL HIGH (ref 0.00–0.50)

## 2024-05-02 NOTE — Telephone Encounter (Signed)
 Nurses Please call the daughter Dagoberto Let her know the ultrasound shows a Baker's cyst on the left knee which is not causing her trouble No sign of DVT Blood work did show elevated D-dimer which indicates a increased risk of developing blood clots-because of that it is recommended to repeat bilateral ultrasounds of the legs on Wednesday   Please set up stat ultrasound of bilateral legs for Wednesday Reason calf pain as well as elevated D-dimer

## 2024-05-02 NOTE — Progress Notes (Signed)
   Subjective:    Patient ID: Julie Swanson, female    DOB: 1928/07/27, 89 y.o.   MRN: [id number]  HPI She describes a lot of fatigue tiredness in addition to this abdominal swelling and some intermittent discomfort no rectal bleeding also the left breast mass has enlarged in size No sign of infection going on Also relates Pain bilateral especially with standing she stands with assistance takes a few steps but she related pain going on over the past few days She feels like her strength is getting a little bit better she still has trouble with getting disoriented but at times she is actually fairly with it Her daughter preps her food for her They help her with her bathing and with bathroom as well   Review of Systems     Objective:   Physical Exam  Enlarged area on the breasts approximately 4 cm x 3 and half centimeters by 2 cm with red dimpling of the skin Lungs are clear heart is regular Extremities has tenderness in both calves no swelling is noted Abdomen is soft to some degree but firm at the same time no tenderness but mildly distended      Assessment & Plan:  Lab work ordered D-dimer ordered Ultrasound ordered  Patient is getting improved.  It is really hard to say how many months she will live It is quite possible she could go beyond 6 months but at the same time given her abdominal distention as well as left breast findings it is quite possible this will take a term in a negative direction. She does have what appears to be a breast cancer on the left side which is showing signs of progression but no ulcerations Her abdomen is moderately swollen which is different than the last visit yet her appetite is doing fair she is not having any nausea or vomiting and no diarrhea no bleeding-we will hold off on doing CT scan currently but if things get worse we will progress toward doing so  This patient ever since having the breast nodule on the left side I has been of the mindset  of not doing aggressive intervention.  Because of her age and debilitated condition she chose not to do a breast biopsy and we have been following this physically over the past few years.  It is getting larger.  When she had the rectal bleeding and had a abnormal CT scan with thickened colon she declined to have colonoscopy with tissue biopsy.  It was of the opinion of the gastroenterologist this could indicate cancer.  Given the patient's findings it is quite possible she is dealing with colon cancer as well as breast cancer but due to patient not doing aggressive measures or even confirmatory measures we do not have a tissue diagnosis. The patient still requires maximal assistance of family and aides If her abdominal symptoms get worse such as bleeding vomiting severe pain progressing toward another CT scan would be reasonable.

## 2024-05-02 NOTE — Telephone Encounter (Signed)
 The nurse at hospice stated that she needed a up-to-date report from today's visit for hospice qualification purposes  Please fax today's office note and mammogram and CAT scan as printed out to hospice of [place]  [phone]

## 2024-05-03 ENCOUNTER — Telehealth: Payer: Self-pay | Admitting: Family Medicine

## 2024-05-03 ENCOUNTER — Ambulatory Visit: Payer: Self-pay | Admitting: Family Medicine

## 2024-05-03 NOTE — Telephone Encounter (Signed)
 Please see other messages Set up Doppler ultrasounds for next Wednesday of both legs to rule out the possibility of blood clot the reason is she had elevated D-dimer and calf pain  Please make sure her daughter is aware thank you-[name]

## 2024-05-05 ENCOUNTER — Telehealth: Payer: Self-pay | Admitting: Family Medicine

## 2024-05-05 ENCOUNTER — Ambulatory Visit: Admitting: Family Medicine

## 2024-05-05 NOTE — Telephone Encounter (Signed)
 Information from last week which was sent to hospice please resend  If the information that was sent last week is not available then we send 05/02/2024 note And also most recent abdominal CT scan report and mammogram report from 04/12/2018  Please send to hospice Julie Swanson [phone]  Please coordinate with the front to get this completed today thank you

## 2024-05-05 NOTE — Telephone Encounter (Signed)
 Have messaged referral staff regarding lower extremity doppler requested

## 2024-05-06 ENCOUNTER — Other Ambulatory Visit: Payer: Self-pay | Admitting: Family Medicine

## 2024-05-06 DIAGNOSIS — M79669 Pain in unspecified lower leg: Secondary | ICD-10-CM

## 2024-05-06 DIAGNOSIS — R7989 Other specified abnormal findings of blood chemistry: Secondary | ICD-10-CM

## 2024-05-06 NOTE — Telephone Encounter (Signed)
 Spoke with Julie Swanson, daughter and she states she is going to communicate directly with central scheduling to re-schedule ultrasound due to busy work schedule. She is aware of the time constraint of the ultrasound and would like to know if another ddimer is needed ? Also would like to inform the doctor that hospice has approved Julie Swanson for additional services. She will be communicating via mychart to let us  know once she schedules the appt.

## 2024-05-06 NOTE — Telephone Encounter (Signed)
 No additional D-dimer is needed It is best to make sure that the ultrasound gets completed this week at the latest As for the hospice thanks for the update

## 2024-05-07 ENCOUNTER — Ambulatory Visit (HOSPITAL_COMMUNITY): Admission: RE | Admit: 2024-05-07 | Source: Ambulatory Visit

## 2024-05-09 ENCOUNTER — Ambulatory Visit (HOSPITAL_COMMUNITY)
Admission: RE | Admit: 2024-05-09 | Discharge: 2024-05-09 | Disposition: A | Discharge status: Home / Self Care | Source: Ambulatory Visit | Attending: Family Medicine | Admitting: Family Medicine

## 2024-05-09 DIAGNOSIS — R7989 Other specified abnormal findings of blood chemistry: Secondary | ICD-10-CM | POA: Insufficient documentation

## 2024-05-09 DIAGNOSIS — M79662 Pain in left lower leg: Secondary | ICD-10-CM | POA: Diagnosis not present

## 2024-05-09 DIAGNOSIS — M79669 Pain in unspecified lower leg: Secondary | ICD-10-CM | POA: Insufficient documentation

## 2024-05-09 DIAGNOSIS — M7122 Synovial cyst of popliteal space [Baker], left knee: Secondary | ICD-10-CM | POA: Diagnosis not present

## 2024-05-10 ENCOUNTER — Ambulatory Visit: Payer: Self-pay | Admitting: Family Medicine

## 2024-05-13 ENCOUNTER — Telehealth: Payer: Self-pay | Admitting: Cardiology

## 2024-05-13 NOTE — Telephone Encounter (Signed)
Called to r/s appt

## 2024-05-27 ENCOUNTER — Encounter: Admitting: Cardiology

## 2024-06-05 ENCOUNTER — Telehealth: Payer: Self-pay | Admitting: *Deleted

## 2024-06-05 ENCOUNTER — Other Ambulatory Visit: Payer: Self-pay

## 2024-06-05 DIAGNOSIS — K648 Other hemorrhoids: Secondary | ICD-10-CM

## 2024-06-05 NOTE — Telephone Encounter (Signed)
 May have a urgent referral to Dr. Bernarda Ned with [facility] surgery for rectal bleeding hemorrhoids-please have referral team to work with them to try to get the appointment as soon as possible  Also stay off of the Eliquis  I would not recommend resuming it for now at least until the hemorrhoid is taking care of  Also as for the furosemide  stick with current morning dose but may take that half tablet instead of at 2:30 PM move it up to 1 PM  Lasix  has a lasting power of 6 hours meaning that if you take it by 1 PM the effects of that are gone by 7/8 PM  Most individuals with urinary tract infections would show symptoms or at least have some level of symptoms both day and nighttime  If moving up the afternoon dose does not help the nighttime you can try a trial of 1 week to see if that makes a difference of leaving off the afternoon dose but I would try the above first thank you-Dr. Glendia  Feel free to send us  follow-up in 7 to 10 days thanks

## 2024-06-05 NOTE — Telephone Encounter (Signed)
 Nurses please call family to try to find out from [place] what is going on More information would help me make a decision thank you

## 2024-06-05 NOTE — Telephone Encounter (Signed)
 Pt daughter states that on Monday she noticed pt had a hemorrhoid.  The toilet was full of blood and it was bright, red blood. The hemorrhoid is both external and internal. The patient was visited this morning by her hospice nurse, Darice, and Darice confirmed that it was a hemorrhoid. The patients daughter has stopped the Eliquis  and wants to know should she stay off of it? Pt daughter is also wanting a referral to Dr Bernarda Ned for pt hemorrhoid. The daughter would like the referral placed as an urgent referral. The patient has also started urinating frequently during the night. There are no symptoms pointing to a UTI per the hospice nurse. Pt daughter also wants to know if the patient can stop the PM dose of Lasix  due to the frequent urination. Please advise.

## 2024-06-05 NOTE — Telephone Encounter (Signed)
 Copied from CRM [id number]. Topic: Clinical - Medical Advice >> Jun 05, 2024 10:41 AM Miller H wrote: Reason for CRM: Please call Dagoberto [phone] about Eliquis  due to patient daughter holding her from taking medication//Also would like to know if they should be holding Lasix //

## 2024-06-05 NOTE — Telephone Encounter (Signed)
 Please see MyChart message I addressed all of these issues within that message thank you

## 2024-06-10 NOTE — Telephone Encounter (Signed)
 Nurses Please have the referral staff go the extra mile for this patient Please send it again Please see if they could call the doctor's office to let them know it has been sent We have sent the referral previously Please send it again Please have referral staff call the number of the surgeons to let them know it has been sent Therefore the ball would be squarely in their court at that point Then if the family does not hear from them Julie Swanson should be able to call them directly Hopefully this will help resolve the situation for the patient thank you Thank you so much for everybody's effort

## 2024-06-11 ENCOUNTER — Ambulatory Visit (INDEPENDENT_AMBULATORY_CARE_PROVIDER_SITE_OTHER): Admitting: Family Medicine

## 2024-06-11 DIAGNOSIS — I749 Embolism and thrombosis of unspecified artery: Secondary | ICD-10-CM

## 2024-06-11 DIAGNOSIS — G459 Transient cerebral ischemic attack, unspecified: Secondary | ICD-10-CM

## 2024-06-11 NOTE — Progress Notes (Unsigned)
 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 Also has mild encephalopathy due to cognitive changes possible small strokes  Called out to the house today mainly because she had a spell in the middle of the night that which she lost function of her right arm and right leg and then gradually came back over 3 hours during that time she was very stiff on the right side unable to stand not able to really respond well but by midmorning she was more alert and was requesting to get out of bed family helped her out of bed she did have a bowel movement no blood  She had had previous blood in the stool on several accounts due to a hemorrhoid We are in the process of trying to refer her to her surgeon Previously we pulled away her Eliquis  because of the bleeding with the hemorrhoid  Patient is pleasant she is aware of who her daughter is aware of who I am but does not have a full recollection of the events over the past 24 hours She has slight weakness in the right leg and right arm but the best I can tell it is very close to what we are seeing on the left side With the patient and discussing plan of action with both daughters as well as hospice nurse and discussing it with the patient as well Because of weakness with the patient's legs she would benefit from a hospital bed to help adjust heights when they are assisting her out of bed it is necessary for at least 1 person if not 2 people to assist her with walking It is also wise to use a bedside commode to minimize risk of falls We are trying to get her in with the surgeon for the hemorrhoid In addition to this it is reasonable to restart Eliquis  2.5 mg twice daily but if she starts having bleeding episodes with this we would have to stop  In addition to this I did discuss with both daughters how this could have been a small stroke unlikely to be a intracranial bleed it does appear that she has recovered greater than  90% and potentially 100% of her right side given all of this person could opt to go to the ER and have an MRI and other workup but given that she has hospice we are trying to avoid major interventions we will manage this at home Obviously there is theoretic risks that her situation could deteriorate or get worse with another stroke in the near future but once again we are trying to approach this from a minimal less approach per family's wishes and also per situation It is most likely she has had a TIA related to atrial fibrillation with embolism/embolic issue-once again reasonable to restart Eliquis  30 minutes spent due to her situation via home visit

## 2024-06-20 ENCOUNTER — Other Ambulatory Visit (HOSPITAL_COMMUNITY): Payer: Self-pay

## 2024-06-20 MED ORDER — DILTIAZEM HCL ER COATED BEADS 240 MG PO CP24: 240.0000 mg | ORAL_CAPSULE | Freq: Every day | ORAL | 3 refills | Status: AC

## 2024-06-20 MED ORDER — ATORVASTATIN CALCIUM 10 MG PO TABS: 10.0000 mg | ORAL_TABLET | Freq: Every day | ORAL | 3 refills | Status: AC

## 2024-06-20 MED ORDER — METOPROLOL SUCCINATE ER 50 MG PO TB24: ORAL_TABLET | ORAL | 3 refills | Status: AC

## 2024-06-20 MED ORDER — APIXABAN 2.5 MG PO TABS: 2.5000 mg | ORAL_TABLET | Freq: Two times a day (BID) | ORAL | 3 refills | Status: AC

## 2024-06-20 MED ORDER — FUROSEMIDE 40 MG PO TABS: 80.0000 mg | ORAL_TABLET | Freq: Two times a day (BID) | ORAL | 3 refills | Status: AC

## 2024-06-20 MED ORDER — POTASSIUM CHLORIDE CRYS ER 20 MEQ PO TBCR: 40.0000 meq | EXTENDED_RELEASE_TABLET | Freq: Every day | ORAL | 3 refills | Status: AC

## 2024-06-26 ENCOUNTER — Ambulatory Visit: Admitting: Family Medicine

## 2024-06-27 ENCOUNTER — Ambulatory Visit

## 2024-06-27 ENCOUNTER — Telehealth: Payer: Self-pay | Admitting: Family Medicine

## 2024-06-27 NOTE — Telephone Encounter (Signed)
 Unfortunately earlier this week I was running behind Family could not wait They went home Please schedule a home visit at 1120 slot somewhere in November Please notify family I will see her at that time thank you

## 2024-06-27 NOTE — Telephone Encounter (Signed)
 Sounds good thanks

## 2024-07-09 ENCOUNTER — Ambulatory Visit (INDEPENDENT_AMBULATORY_CARE_PROVIDER_SITE_OTHER): Payer: Self-pay | Admitting: Family Medicine

## 2024-07-09 ENCOUNTER — Encounter: Payer: Self-pay | Admitting: Family Medicine

## 2024-07-09 VITALS — BP 123/68 | HR 78 | Temp 97.8°F | Ht 64.0 in | Wt 129.1 lb

## 2024-07-09 DIAGNOSIS — C182 Malignant neoplasm of ascending colon: Secondary | ICD-10-CM | POA: Diagnosis not present

## 2024-07-09 DIAGNOSIS — Z23 Encounter for immunization: Secondary | ICD-10-CM | POA: Diagnosis not present

## 2024-07-09 DIAGNOSIS — E1169 Type 2 diabetes mellitus with other specified complication: Secondary | ICD-10-CM | POA: Diagnosis not present

## 2024-07-09 DIAGNOSIS — E1122 Type 2 diabetes mellitus with diabetic chronic kidney disease: Secondary | ICD-10-CM | POA: Diagnosis not present

## 2024-07-09 DIAGNOSIS — R600 Localized edema: Secondary | ICD-10-CM | POA: Diagnosis not present

## 2024-07-09 DIAGNOSIS — N1831 Chronic kidney disease, stage 3a: Secondary | ICD-10-CM | POA: Diagnosis not present

## 2024-07-09 DIAGNOSIS — I5032 Chronic diastolic (congestive) heart failure: Secondary | ICD-10-CM | POA: Diagnosis not present

## 2024-07-09 DIAGNOSIS — E785 Hyperlipidemia, unspecified: Secondary | ICD-10-CM | POA: Diagnosis not present

## 2024-07-09 DIAGNOSIS — I4891 Unspecified atrial fibrillation: Secondary | ICD-10-CM | POA: Diagnosis not present

## 2024-07-09 NOTE — Progress Notes (Signed)
 Subjective:    Patient ID: Julie Swanson, female    DOB: March 02, 1928, 89 y.o.   MRN: [id number]  HPI Pt has a few concerns today. She wants to know if she can get her flu shot today.   She states she is having Hemorid banding on Monday. She said the doctor that is doing the procedure recommended that she stop the eliquis  two days prior to her procedure Monday.   She is still having restless nights waking up about 4 times, said it has been brought up before but it is still going on.   States she is peeing and no sign of uti. Concern for bedside bathroom per caregiver.   Discussed the use of AI scribe software for clinical note transcription with the patient, who gave verbal consent to proceed.  History of Present Illness   Julie Swanson is a 89 year old female who presents for management of hemorrhoids and sleep disturbances.  She experiences discomfort from hemorrhoids, with a recent episode of rectal bleeding. She is on Eliquis .  She has sleep disturbances, waking multiple times at night. Her caregivers report varying accounts of her nighttime awakenings. She wants to manage her nighttime bathroom needs independently, but there are safety concerns with her use of a port-a-potty at night. Her Lasix  has been adjusted to be taken by 1:00 PM to avoid nighttime disturbances.  Her current medications include Eliquis  and Lasix . She completed a course of amoxicillin for a tooth infection, stopping one day early due to concerns about thrush. She uses a special microbial mouthwash for oral health. Her tongue is sore, with concerns about potential thrush, though none is observed.  She has a good appetite and mobility, with assistance from her caregiver for daily exercises to maintain circulation and strength. She uses a walker for stability and performs leg exercises daily. Her bowel movements are good, with no recent blood noted except for the one episode that led to the referral.       Review  of Systems     Objective:   Physical Exam General-in no acute distress Eyes-no discharge Lungs-respiratory rate normal, CTA CV-no murmurs,RRR Extremities skin warm dry no edema Neuro grossly normal Behavior normal, alert Patient is wheelchair-bound  Hemorrhoids Scheduled for banding. Eliquis  to be stopped 48 hours prior to minimize bleeding risk. - Stop Eliquis  on Saturday morning before the procedure. - Proceed with hemorrhoid banding on Monday. - Resume Eliquis  post-procedure as advised by the specialist.  Mobility impairment and fall risk Uses walker. Nighttime bathroom trips pose fall risk. Caregiver assistance crucial. - Rearrange room for safe port-a-potty use at night. - Ensure caregiver assistance during nighttime bathroom trips. - Continue daily exercises to maintain mobility.  Insomnia Frequent awakenings. Melatonin discussed. Avoid sedatives due to fall risk. - Consider melatonin 3-5 mg OTC if sleep disturbances worsen. - Avoid sedative medications due to risk of confusion and falls.  Glossitis Sore tongue possibly from antibiotics. No thrush. Monitor for worsening. - Monitor tongue for worsening symptoms. - Prescribe nystatin  oral solution if symptoms worsen.  Adult Wellness Visit Appetite reported as good. Mobility impaired; uses walker and requires assistance. Engages in daily exercises. - Continue current exercise regimen with assistance. - Encourage independence while ensuring safety.  General Health Maintenance Flu shot discussed. Senior dose available with low adverse effect risk. - Administer senior dose flu shot.     Assessment & Plan:  1. Atrial fibrillation, unspecified type (HCC) (Primary) Continue Eliquis  low-dose 2.5 twice daily  if significant bleeding issues let us  know under good control currently - Basic Metabolic Panel (BMET)  2. Pedal edema Check metabolic 7 for next visit - Basic Metabolic Panel (BMET)  3. Immunization due Flu  shot today - Flu vaccine HIGH DOSE PF(Fluzone Trivalent)  4. Malignant neoplasm of ascending colon [facility]) Patient was diagnosed with a growth on a CAT scan more than likely malignant but patient deferred on having any type of colon testing such as biopsy we are treating all of this via supportive measures patient has hospice care  5. Chronic diastolic heart failure (HCC) Stable currently continue current medications  6. Hyperlipidemia associated with type 2 diabetes mellitus (HCC) Lab work periodically patient on statin  7. Type 2 diabetes mellitus with stage 3a chronic kidney disease, without long-term current use of insulin (HCC) Lab work, healthy diet continue meds  Follow-up 4 months  If necessary melatonin can be used but currently patient does not want anything to help with sleep hold off on the use Patient is wanting to use a bedside commode during the night that would be fine as long as there is enough room to safely utilize it We will follow-up again in 3 to 4 months

## 2024-07-10 ENCOUNTER — Encounter (HOSPITAL_COMMUNITY): Admitting: Cardiology

## 2024-07-11 ENCOUNTER — Telehealth: Payer: Self-pay | Admitting: Family Medicine

## 2024-07-11 NOTE — Telephone Encounter (Signed)
 Copied from CRM #[id number]. Topic: Clinical - Medication Question >> Jul 11, 2024  1:57 PM Donee H wrote: Reason for CRM: Patient's daughter Trenise Turay called regarding a specific mouthwash that prescribed for patient at recent visit. She states prescription was never sent over and patient really need it. She is requesting to have it sent as soon as possible to   [facility] [place], [place] - [id number] [place] [id number] Phone: [phone] Fax: [phone] Hours: Not open 24 hours   Dagoberto would like someone to follow up with her on request [phone]

## 2024-07-11 NOTE — Telephone Encounter (Signed)
 Spoke to patient's daughter, Dagoberto, who states that she and Dr. Alphonsa discussed that her mother's tongue had been burning during the last office visit on 07/09/2024 and that he would send a prescription for mouthwash to the pharmacy.   She reports that her mother continues to have tongue pain and would like the mouthwash called in.

## 2024-07-15 NOTE — Telephone Encounter (Signed)
 It is hard to know what is triggering this I would recommend starting off with nystatin  oral solution 5 mL swish and swallow 4 times daily for 7 days  If ongoing troubles let us  know

## 2024-08-14 ENCOUNTER — Ambulatory Visit (HOSPITAL_COMMUNITY): Admitting: Cardiology

## 2024-08-18 ENCOUNTER — Encounter: Payer: Self-pay | Admitting: Family Medicine

## 2024-08-18 ENCOUNTER — Other Ambulatory Visit: Payer: Self-pay | Admitting: Family Medicine

## 2024-08-18 DIAGNOSIS — E1169 Type 2 diabetes mellitus with other specified complication: Secondary | ICD-10-CM

## 2024-08-18 DIAGNOSIS — D649 Anemia, unspecified: Secondary | ICD-10-CM

## 2024-08-18 DIAGNOSIS — N1831 Chronic kidney disease, stage 3a: Secondary | ICD-10-CM

## 2024-08-18 DIAGNOSIS — E538 Deficiency of other specified B group vitamins: Secondary | ICD-10-CM

## 2024-08-18 DIAGNOSIS — R1033 Periumbilical pain: Secondary | ICD-10-CM

## 2024-08-18 DIAGNOSIS — R634 Abnormal weight loss: Secondary | ICD-10-CM

## 2024-08-18 NOTE — Telephone Encounter (Signed)
 Nurses I ordered the lab work through the [facility] They can do this at their convenience We are checking B12 level along with multiple other tests because of her burning tongue and also her underlying other health issues  Family is requesting office visit sooner in January My schedule unfortunately tends to be quite booked-if possible please accommodate them with myself-if not you will need to let them know and we would provide documentation the best we can before the end of January or they could see one of the other additional providers  It is possible that I do have some same-day slots available please check thank you please keep family in the loop thank you  As for the dry scalp I would recommend utilizing a shampoo such as head and shoulders or Selsun Blue-dampen the scalp and hair with water , then utilize a shampoo creating suds then letting that sit on the scalp for 10 to 15 minutes before rinsing, try to do that every other day  As for the tongue I would recommend a bio rinse with artificial saliva-OTC There are some prescription medications that could be tried for this but they come with their own set of potential side effects would most likely be best to be tried only if the situation was getting worse and family was willing to try something more in the realm of prescribed medicines  Thanks-Dr. Glendia

## 2024-08-22 ENCOUNTER — Telehealth: Payer: Self-pay | Admitting: *Deleted

## 2024-08-22 NOTE — Telephone Encounter (Unsigned)
 Copied from CRM #[id number]. Topic: Referral - Question >> Aug 22, 2024 11:28 AM Nathanel BROCKS wrote: Reason for CRM: [facility] called and stated that they got a referral and they are unable to see pt. I asked why and they did not comment.

## 2024-08-22 NOTE — Telephone Encounter (Signed)
 Noted thank you

## 2024-09-02 LAB — BASIC METABOLIC PANEL WITH GFR
BUN/Creatinine Ratio: 19 (calc) (ref 6–22)
BUN: 18 mg/dL (ref 7–25)
CO2: 29 mmol/L (ref 20–32)
Calcium: 9.1 mg/dL (ref 8.6–10.4)
Chloride: 98 mmol/L (ref 98–110)
Creat: 0.97 mg/dL — ABNORMAL HIGH (ref 0.60–0.95)
Glucose, Bld: 190 mg/dL — ABNORMAL HIGH (ref 65–99)
Potassium: 4.2 mmol/L (ref 3.5–5.3)
Sodium: 136 mmol/L (ref 135–146)
eGFR: 54 mL/min/1.73m2 — ABNORMAL LOW

## 2024-09-03 ENCOUNTER — Ambulatory Visit: Payer: Self-pay | Admitting: Family Medicine

## 2024-09-15 ENCOUNTER — Ambulatory Visit (HOSPITAL_COMMUNITY): Admitting: Cardiology

## 2024-09-22 ENCOUNTER — Ambulatory Visit: Admitting: Family Medicine

## 2024-09-22 ENCOUNTER — Encounter: Payer: Self-pay | Admitting: Family Medicine

## 2024-09-22 VITALS — BP 138/68 | HR 79 | Temp 97.5°F | Ht 64.0 in | Wt 145.0 lb

## 2024-09-22 DIAGNOSIS — I4891 Unspecified atrial fibrillation: Secondary | ICD-10-CM | POA: Diagnosis not present

## 2024-09-22 DIAGNOSIS — R413 Other amnesia: Secondary | ICD-10-CM | POA: Diagnosis not present

## 2024-09-22 DIAGNOSIS — F09 Unspecified mental disorder due to known physiological condition: Secondary | ICD-10-CM

## 2024-09-22 DIAGNOSIS — E1122 Type 2 diabetes mellitus with diabetic chronic kidney disease: Secondary | ICD-10-CM

## 2024-09-22 DIAGNOSIS — N1831 Chronic kidney disease, stage 3a: Secondary | ICD-10-CM | POA: Diagnosis not present

## 2024-09-22 DIAGNOSIS — J438 Other emphysema: Secondary | ICD-10-CM

## 2024-09-22 DIAGNOSIS — I5032 Chronic diastolic (congestive) heart failure: Secondary | ICD-10-CM

## 2024-09-22 DIAGNOSIS — C50112 Malignant neoplasm of central portion of left female breast: Secondary | ICD-10-CM | POA: Diagnosis not present

## 2024-09-22 MED ORDER — LINAGLIPTIN 5 MG PO TABS: 5.0000 mg | ORAL_TABLET | Freq: Every day | ORAL | 1 refills | Status: AC

## 2024-09-22 NOTE — Progress Notes (Signed)
" ° °  Subjective:    Patient ID: Julie Swanson, female    DOB: Oct 24, 1927, 89 y.o.   MRN: [id number]  HPI Patient is in room 10  Patient is here for a 4 month follow up Patient has breast cancer on the left side This area is getting worse and is getting enlarged with dimpling in the skin and then some scabbing and very close to being ulcerative Patient also had a TIA in the past along with falls She also have poor short-term memory with cognitive dysfunction She is not capable of bathing herself, going to the bathroom, fixing her food She is capable of eating with some assistance-family cuts up her foods Family or assistant provides meal preparation, assistance with feedings at times with cutting upper foods, they also monitor her medications administer her meds.  The patient intermittently does not always swallow her medicines so they have to police the situation and make sure that she swallows on a regular basis She is assisted with hygiene because she is not able to wash herself Because of previous physical limitations she is not able to wipe herself after going #2 They help her with getting dressed They help her with getting up out of bed transferring into a wheelchair They help her from transferring to a wheelchair to a chair that she sits in in the living room She is able to take a few steps with a walker during the middle of the day when family is next to her but she is not able to use a walker at nighttime She typically gets confused at nighttime She has memory problems during the day repeats herself intermittently throughout the day with the same questions or thoughts At nighttime she thinks she needs to get up to do various tasks or actions that are not present for her anymore showing signs that she has significant decline in cognitive awareness and function at nighttime Family also helps with oral hygiene Typically they try to get her to bed near 10 PM and she typically gets up  around 8 AM there is an attendant with her 24/7 either a family member or attendant Without this assistance she would be in a nursing home She would not be a suitable candidate for a rest home  Patient is need a reassessment long term care SHIP   Review of Systems     Objective:   Physical Exam Wheelchair-bound Pleasant  Blood pressure recheck Lungs clear heart regular Enlarged area on the left breast is now 3-1/2 inches x 2-1/2 inches with dimpling very thin skin preulcerative She has gained weight  Patient never agreed to having biopsy of this area because she did not want any treatments.  It is obvious that this is a breast cancer because it is enlarging it is hard and there is skin dimpling with preulcerative area For now we will no palliative radiation necessary    Assessment & Plan:  Breast cancer left side Preulcerative Patient is hospice care We are doing palliative approach by avoiding labs and unnecessary testing Continue current medications Patient is completely dependent on others for her care She is here today only because her daughter brought her Atrial fibs as well as mild cardiomyopathy and mild COPD stable  "

## 2024-09-29 ENCOUNTER — Ambulatory Visit (HOSPITAL_COMMUNITY): Admitting: Cardiology

## 2024-10-30 ENCOUNTER — Ambulatory Visit: Admitting: Family Medicine

## 2025-01-21 ENCOUNTER — Ambulatory Visit: Admitting: Family Medicine

## 2025-01-22 ENCOUNTER — Ambulatory Visit: Admitting: Family Medicine
